# Patient Record
Sex: Male | Born: 1950
Health system: Southern US, Community
[De-identification: ages and names within clinical notes are randomized; demographics above are authoritative.]

## PROBLEM LIST (undated history)

## (undated) DIAGNOSIS — K219 Gastro-esophageal reflux disease without esophagitis: Secondary | ICD-10-CM

## (undated) DIAGNOSIS — E039 Hypothyroidism, unspecified: Secondary | ICD-10-CM

## (undated) DIAGNOSIS — M1990 Unspecified osteoarthritis, unspecified site: Secondary | ICD-10-CM

## (undated) DIAGNOSIS — D759 Disease of blood and blood-forming organs, unspecified: Secondary | ICD-10-CM

## (undated) DIAGNOSIS — E785 Hyperlipidemia, unspecified: Secondary | ICD-10-CM

## (undated) DIAGNOSIS — F32A Depression, unspecified: Secondary | ICD-10-CM

## (undated) DIAGNOSIS — C801 Malignant (primary) neoplasm, unspecified: Secondary | ICD-10-CM

## (undated) DIAGNOSIS — C4491 Basal cell carcinoma of skin, unspecified: Secondary | ICD-10-CM

## (undated) DIAGNOSIS — F329 Major depressive disorder, single episode, unspecified: Secondary | ICD-10-CM

## (undated) DIAGNOSIS — R200 Anesthesia of skin: Secondary | ICD-10-CM

## (undated) DIAGNOSIS — N400 Enlarged prostate without lower urinary tract symptoms: Secondary | ICD-10-CM

## (undated) DIAGNOSIS — J189 Pneumonia, unspecified organism: Secondary | ICD-10-CM

## (undated) DIAGNOSIS — J302 Other seasonal allergic rhinitis: Secondary | ICD-10-CM

## (undated) DIAGNOSIS — G4730 Sleep apnea, unspecified: Secondary | ICD-10-CM

## (undated) DIAGNOSIS — R7303 Prediabetes: Secondary | ICD-10-CM

## (undated) DIAGNOSIS — F419 Anxiety disorder, unspecified: Secondary | ICD-10-CM

## (undated) HISTORY — DX: Benign prostatic hyperplasia without lower urinary tract symptoms: N40.0

## (undated) HISTORY — DX: Gastro-esophageal reflux disease without esophagitis: K21.9

## (undated) HISTORY — DX: Malignant (primary) neoplasm, unspecified: C80.1

## (undated) HISTORY — PX: MOLE REMOVAL: SHX2046

## (undated) HISTORY — PX: TONSILLECTOMY: SUR1361

## (undated) HISTORY — DX: Anesthesia of skin: R20.0

## (undated) HISTORY — DX: Hyperlipidemia, unspecified: E78.5

## (undated) HISTORY — PX: CARDIAC CATHETERIZATION: SHX172

---
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
## 2002-11-14 ENCOUNTER — Ambulatory Visit (HOSPITAL_COMMUNITY): Admission: RE | Admit: 2002-11-14 | Discharge: 2002-11-14 | Payer: Self-pay | Admitting: Neurosurgery

## 2002-11-14 ENCOUNTER — Encounter: Payer: Self-pay | Admitting: Neurosurgery

## 2007-06-19 DIAGNOSIS — C4491 Basal cell carcinoma of skin, unspecified: Secondary | ICD-10-CM

## 2007-06-19 HISTORY — DX: Basal cell carcinoma of skin, unspecified: C44.91

## 2010-02-09 ENCOUNTER — Ambulatory Visit (HOSPITAL_COMMUNITY): Admission: RE | Admit: 2010-02-09 | Discharge: 2010-02-09 | Payer: Self-pay | Admitting: Cardiology

## 2010-02-09 ENCOUNTER — Encounter (INDEPENDENT_AMBULATORY_CARE_PROVIDER_SITE_OTHER): Payer: Self-pay | Admitting: Cardiology

## 2010-02-17 ENCOUNTER — Encounter (HOSPITAL_COMMUNITY): Admission: RE | Admit: 2010-02-17 | Discharge: 2010-03-07 | Payer: Self-pay | Admitting: Cardiology

## 2010-05-10 ENCOUNTER — Ambulatory Visit (HOSPITAL_COMMUNITY): Admission: RE | Admit: 2010-05-10 | Discharge: 2010-05-10 | Payer: Self-pay | Admitting: Internal Medicine

## 2010-08-31 ENCOUNTER — Ambulatory Visit: Payer: Self-pay | Admitting: Internal Medicine

## 2010-08-31 DIAGNOSIS — R1319 Other dysphagia: Secondary | ICD-10-CM

## 2010-08-31 DIAGNOSIS — R109 Unspecified abdominal pain: Secondary | ICD-10-CM

## 2010-08-31 DIAGNOSIS — K5900 Constipation, unspecified: Secondary | ICD-10-CM

## 2010-08-31 DIAGNOSIS — K219 Gastro-esophageal reflux disease without esophagitis: Secondary | ICD-10-CM

## 2010-09-18 ENCOUNTER — Ambulatory Visit (HOSPITAL_COMMUNITY): Admission: RE | Admit: 2010-09-18 | Discharge: 2010-09-18 | Payer: Self-pay | Admitting: Internal Medicine

## 2010-09-18 ENCOUNTER — Ambulatory Visit: Payer: Self-pay | Admitting: Internal Medicine
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
## 2010-11-21 NOTE — Letter (Signed)
Summary: TCS/EGD/ED ORDER  TCS/EGD/ED ORDER   Imported By: Ave Filter 08/31/2010 15:04:50  _____________________________________________________________________  External Attachment:    Type:   Image     Comment:   External Document

## 2010-12-08 ENCOUNTER — Encounter (INDEPENDENT_AMBULATORY_CARE_PROVIDER_SITE_OTHER): Payer: Self-pay

## 2010-12-13 NOTE — Letter (Signed)
Summary: Recall Colonoscopy/Endoscopy, Change to Office Visit  [facility]  [address]   [place], [place] [address]   Phone: [phone]  Fax: [phone]      December 08, 2010   Alfred Jones [address] [place], [place]  [address] 1951/06/05   Dear Mr. Gurski,   According to our records, it is time for you to schedule a Colonoscopy/Endoscopy. However, after reviewing your medical record, we recommend an office visit in order to determine your need for a repeat procedure.  Please call [phone] at your convenience to schedule an office visit. If you have any questions or concerns, please feel free to contact our office.   Sincerely,   Cloria Spring LPN  [facility] Ph: [phone]   Fax: [phone]

## 2011-01-09 LAB — BLOOD GAS, ARTERIAL
Acid-Base Excess: 1.6 mmol/L (ref 0.0–2.0)
pCO2 arterial: 39.9 mmHg (ref 35.0–45.0)
pH, Arterial: 7.423 (ref 7.350–7.450)
pO2, Arterial: 92.3 mmHg (ref 80.0–100.0)

## 2015-04-22 ENCOUNTER — Ambulatory Visit (INDEPENDENT_AMBULATORY_CARE_PROVIDER_SITE_OTHER): Payer: Self-pay | Admitting: Urology

## 2015-04-22 DIAGNOSIS — N401 Enlarged prostate with lower urinary tract symptoms: Secondary | ICD-10-CM

## 2015-04-22 DIAGNOSIS — R3912 Poor urinary stream: Secondary | ICD-10-CM

## 2015-06-24 ENCOUNTER — Ambulatory Visit (INDEPENDENT_AMBULATORY_CARE_PROVIDER_SITE_OTHER): Payer: Self-pay | Admitting: Urology

## 2015-06-24 DIAGNOSIS — N138 Other obstructive and reflux uropathy: Secondary | ICD-10-CM

## 2015-06-24 DIAGNOSIS — N401 Enlarged prostate with lower urinary tract symptoms: Secondary | ICD-10-CM

## 2015-06-24 DIAGNOSIS — R3912 Poor urinary stream: Secondary | ICD-10-CM

## 2015-09-12 ENCOUNTER — Other Ambulatory Visit: Payer: Self-pay | Admitting: Physician Assistant

## 2015-09-12 MED ORDER — OMEPRAZOLE 40 MG PO CPDR
40.0000 mg | DELAYED_RELEASE_CAPSULE | Freq: Every day | ORAL | Status: DC
Start: 2015-09-12 — End: 2018-04-15

## 2015-09-20 ENCOUNTER — Other Ambulatory Visit: Payer: Self-pay | Admitting: Physician Assistant

## 2015-09-20 MED ORDER — SIMVASTATIN 20 MG PO TABS: 20.0000 mg | ORAL_TABLET | Freq: Every day | ORAL | Status: DC

## 2015-10-18 ENCOUNTER — Ambulatory Visit: Payer: Self-pay | Admitting: Physician Assistant

## 2015-10-18 ENCOUNTER — Encounter: Payer: Self-pay | Admitting: Physician Assistant

## 2015-10-18 VITALS — BP 128/68 | HR 78 | Temp 98.6°F | Ht 71.0 in | Wt 228.2 lb

## 2015-10-18 DIAGNOSIS — B354 Tinea corporis: Secondary | ICD-10-CM

## 2015-10-18 MED ORDER — NYSTATIN 100000 UNIT/GM EX OINT: 1.0000 "application " | TOPICAL_OINTMENT | Freq: Three times a day (TID) | CUTANEOUS | Status: DC

## 2015-10-18 NOTE — Progress Notes (Signed)
BP 128/68 mmHg  Pulse 78  Temp(Src) 98.6 F (37 C)  Ht 5\' 11"  (1.803 m)  Wt 228 lb 3.2 oz (103.511 kg)  BMI 31.84 kg/m2  SpO2 96%   Subjective:    Patient ID: Alfred Jones, male    DOB: 1950-12-10, 64 y.o.   MRN: [id number]  HPI: Alfred Jones is a 64 y.o. male presenting on 10/18/2015 for Rash   HPI  Chief Complaint  Patient presents with  . Rash    itchy rash on both hands for about 2 weeks   States rash also on feet.  Thinks it is related to his hx athlete's foot.  He says he took 2 of his wife's diflucans (a week apart).  Relevant past medical, surgical, family and social history reviewed and updated as indicated. Interim medical history since our last visit reviewed. Allergies and medications reviewed and updated.   Current outpatient prescriptions:  .  citalopram (CELEXA) 20 MG tablet, Take 20 mg by mouth daily., Disp: , Rfl:  .  Coenzyme Q10 (CO Q 10 PO), Take by mouth daily., Disp: , Rfl:  .  Cyanocobalamin (VITAMIN B12 PO), Take 500 mcg by mouth daily., Disp: , Rfl:  .  Docusate Calcium (STOOL SOFTENER PO), Take by mouth as needed., Disp: , Rfl:  .  finasteride (PROSCAR) 5 MG tablet, Take 5 mg by mouth 2 (two) times daily., Disp: , Rfl:  .  Omega-3 Fatty Acids (FISH OIL PO), Take 2,000 mg by mouth 2 (two) times daily., Disp: , Rfl:  .  simvastatin (ZOCOR) 20 MG tablet, Take 1 tablet (20 mg total) by mouth at bedtime., Disp: 30 tablet, Rfl: 3 .  omeprazole (PRILOSEC) 40 MG capsule, Take 1 capsule (40 mg total) by mouth daily. (Patient not taking: Reported on 10/18/2015), Disp: 90 capsule, Rfl: 3   Review of Systems  Constitutional: Negative for fever, chills, diaphoresis, appetite change, fatigue and unexpected weight change.  HENT: Negative for congestion, dental problem, drooling, ear pain, facial swelling, hearing loss, mouth sores, sneezing, sore throat, trouble swallowing and voice change.   Eyes: Negative for pain, discharge, redness, itching and visual  disturbance.  Respiratory: Negative for cough, choking, shortness of breath and wheezing.   Cardiovascular: Negative for chest pain, palpitations and leg swelling.  Gastrointestinal: Positive for constipation. Negative for vomiting, abdominal pain, diarrhea and blood in stool.  Endocrine: Negative for cold intolerance, heat intolerance and polydipsia.  Genitourinary: Positive for decreased urine volume. Negative for dysuria and hematuria.  Musculoskeletal: Positive for back pain and arthralgias. Negative for gait problem.  Skin: Positive for rash.  Allergic/Immunologic: Negative for environmental allergies.  Neurological: Negative for seizures, syncope, light-headedness and headaches.  Hematological: Negative for adenopathy.  Psychiatric/Behavioral: Negative for suicidal ideas, dysphoric mood and agitation. The patient is not nervous/anxious.     Per HPI unless specifically indicated above     Objective:    BP 128/68 mmHg  Pulse 78  Temp(Src) 98.6 F (37 C)  Ht 5\' 11"  (1.803 m)  Wt 228 lb 3.2 oz (103.511 kg)  BMI 31.84 kg/m2  SpO2 96%  Wt Readings from Last 3 Encounters:  10/18/15 228 lb 3.2 oz (103.511 kg)  08/31/10 220 lb (99.791 kg)    Physical Exam  Constitutional: He is oriented to person, place, and time. He appears well-developed and well-nourished.  Pulmonary/Chest: Effort normal.  Neurological: He is alert and oriented to person, place, and time.  Skin: Skin is warm and dry.  Feet  without indications of tinea.  Hands with maceration in webspaces between 3rd and 4th fingers bilaterally.  Minimal redness without indication of secondary infection.  No swelling of the soft tissues of hand or fingers.   Psychiatric: He has a normal mood and affect. His behavior is normal.  Vitals reviewed.       Assessment & Plan:    Encounter Diagnosis  Name Primary?  . Tinea corporis Yes   -rx nystatin and counseled pt on care of his hands and feet -f/u as scheduled. RTO  sooner if rash fails to resolve

## 2015-10-23 DIAGNOSIS — G4730 Sleep apnea, unspecified: Secondary | ICD-10-CM

## 2015-10-23 HISTORY — DX: Sleep apnea, unspecified: G47.30

## 2015-11-09 ENCOUNTER — Other Ambulatory Visit: Payer: Self-pay | Admitting: Physician Assistant

## 2015-11-25 ENCOUNTER — Ambulatory Visit (INDEPENDENT_AMBULATORY_CARE_PROVIDER_SITE_OTHER): Payer: BLUE CROSS/BLUE SHIELD | Admitting: Urology

## 2015-11-25 DIAGNOSIS — N401 Enlarged prostate with lower urinary tract symptoms: Secondary | ICD-10-CM

## 2015-11-25 DIAGNOSIS — R3912 Poor urinary stream: Secondary | ICD-10-CM

## 2015-12-02 ENCOUNTER — Other Ambulatory Visit: Payer: Self-pay | Admitting: Physician Assistant

## 2015-12-02 LAB — COMPREHENSIVE METABOLIC PANEL
ALK PHOS: 51 U/L (ref 40–115)
ALT: 18 U/L (ref 9–46)
AST: 17 U/L (ref 10–35)
Albumin: 4 g/dL (ref 3.6–5.1)
BUN: 14 mg/dL (ref 7–25)
CALCIUM: 8.7 mg/dL (ref 8.6–10.3)
CO2: 26 mmol/L (ref 20–31)
Chloride: 105 mmol/L (ref 98–110)
Creat: 1.22 mg/dL (ref 0.70–1.25)
Glucose, Bld: 95 mg/dL (ref 65–99)
POTASSIUM: 4.1 mmol/L (ref 3.5–5.3)
Sodium: 138 mmol/L (ref 135–146)
TOTAL PROTEIN: 6.5 g/dL (ref 6.1–8.1)
Total Bilirubin: 0.7 mg/dL (ref 0.2–1.2)

## 2015-12-02 LAB — LIPID PANEL
CHOL/HDL RATIO: 4.8 ratio (ref <=5.0)
Cholesterol: 145 mg/dL (ref 125–200)
HDL: 30 mg/dL — AB (ref >=40)
LDL CALC: 86 mg/dL (ref <130)
TRIGLYCERIDES: 147 mg/dL (ref <150)
VLDL: 29 mg/dL (ref <30)

## 2015-12-03 LAB — PSA: PSA: 1.12 ng/mL (ref <=4.00)

## 2015-12-06 ENCOUNTER — Encounter: Payer: Self-pay | Admitting: Physician Assistant

## 2015-12-06 ENCOUNTER — Ambulatory Visit: Payer: Self-pay | Admitting: Physician Assistant

## 2015-12-06 VITALS — BP 118/68 | HR 60 | Temp 98.1°F | Ht 71.0 in | Wt 231.5 lb

## 2015-12-06 DIAGNOSIS — K219 Gastro-esophageal reflux disease without esophagitis: Secondary | ICD-10-CM

## 2015-12-06 DIAGNOSIS — E669 Obesity, unspecified: Secondary | ICD-10-CM

## 2015-12-06 DIAGNOSIS — F329 Major depressive disorder, single episode, unspecified: Secondary | ICD-10-CM

## 2015-12-06 DIAGNOSIS — E785 Hyperlipidemia, unspecified: Secondary | ICD-10-CM | POA: Insufficient documentation

## 2015-12-06 DIAGNOSIS — N400 Enlarged prostate without lower urinary tract symptoms: Secondary | ICD-10-CM

## 2015-12-06 DIAGNOSIS — F32A Depression, unspecified: Secondary | ICD-10-CM

## 2015-12-06 NOTE — Progress Notes (Signed)
BP 118/68 mmHg  Pulse 60  Temp(Src) 98.1 F (36.7 C)  Ht 5\' 11"  (1.803 m)  Wt 231 lb 8 oz (105.008 kg)  BMI 32.30 kg/m2  SpO2 97%   Subjective:    Patient ID: Alfred Jones, male    DOB: 04-22-1951, 65 y.o.   MRN: [id number]  HPI: Alfred Jones is a 65 y.o. male presenting on 12/06/2015 for Hyperlipidemia   HPI  Chief Complaint  Patient presents with  . Hyperlipidemia    pt went to the lab last week    Pt states he has insurance now.  Started within the last month  Relevant past medical, surgical, family and social history reviewed and updated as indicated. Interim medical history since our last visit reviewed. Allergies and medications reviewed and updated.  Current outpatient prescriptions:  .  citalopram (CELEXA) 20 MG tablet, TAKE ONE TABLET BY MOUTH ONCE DAILY, Disp: 30 tablet, Rfl: 1 .  Clobetasol Prop Emollient Base (CLOBETASOL PROPIONATE E) 0.05 % emollient cream, Apply topically 2 (two) times daily., Disp: , Rfl:  .  Coenzyme Q10 (CO Q 10 PO), Take by mouth daily., Disp: , Rfl:  .  Cyanocobalamin (VITAMIN B12 PO), Take 500 mcg by mouth daily., Disp: , Rfl:  .  Docusate Calcium (STOOL SOFTENER PO), Take by mouth as needed., Disp: , Rfl:  .  finasteride (PROSCAR) 5 MG tablet, Take 5 mg by mouth 2 (two) times daily., Disp: , Rfl:  .  ketoconazole (NIZORAL) 2 % cream, Apply 1 application topically daily., Disp: , Rfl:  .  nystatin ointment (MYCOSTATIN), Apply 1 application topically 3 (three) times daily., Disp: 30 g, Rfl: 1 .  Omega-3 Fatty Acids (FISH OIL PO), Take 2,000 mg by mouth 2 (two) times daily., Disp: , Rfl:  .  omeprazole (PRILOSEC) 40 MG capsule, Take 1 capsule (40 mg total) by mouth daily., Disp: 90 capsule, Rfl: 3 .  simvastatin (ZOCOR) 20 MG tablet, Take 1 tablet (20 mg total) by mouth at bedtime., Disp: 30 tablet, Rfl: 3  Review of Systems  Constitutional: Positive for fatigue. Negative for fever, chills, diaphoresis, appetite change and unexpected  weight change.  HENT: Negative for congestion, dental problem, drooling, ear pain, facial swelling, hearing loss, mouth sores, sneezing, sore throat, trouble swallowing and voice change.   Eyes: Negative for pain, discharge, redness, itching and visual disturbance.  Respiratory: Negative for cough, choking, shortness of breath and wheezing.   Cardiovascular: Negative for chest pain, palpitations and leg swelling.  Gastrointestinal: Positive for constipation. Negative for vomiting, abdominal pain, diarrhea and blood in stool.  Endocrine: Negative for cold intolerance, heat intolerance and polydipsia.  Genitourinary: Positive for decreased urine volume. Negative for dysuria and hematuria.  Musculoskeletal: Negative for back pain, arthralgias and gait problem.  Skin: Positive for rash.  Allergic/Immunologic: Negative for environmental allergies.  Neurological: Negative for seizures, syncope, light-headedness and headaches.  Hematological: Negative for adenopathy.  Psychiatric/Behavioral: Negative for suicidal ideas, dysphoric mood and agitation. The patient is not nervous/anxious.     Per HPI unless specifically indicated above     Objective:    BP 118/68 mmHg  Pulse 60  Temp(Src) 98.1 F (36.7 C)  Ht 5\' 11"  (1.803 m)  Wt 231 lb 8 oz (105.008 kg)  BMI 32.30 kg/m2  SpO2 97%  Wt Readings from Last 3 Encounters:  12/06/15 231 lb 8 oz (105.008 kg)  10/18/15 228 lb 3.2 oz (103.511 kg)  08/31/10 220 lb (99.791 kg)  Physical Exam  Constitutional: He is oriented to person, place, and time. He appears well-developed and well-nourished.  HENT:  Head: Normocephalic and atraumatic.  Neck: Neck supple.  Cardiovascular: Normal rate and regular rhythm.   Pulmonary/Chest: Effort normal and breath sounds normal. He has no wheezes.  Abdominal: Soft. Bowel sounds are normal. There is no hepatosplenomegaly. There is no tenderness.  Musculoskeletal: He exhibits no edema.  Lymphadenopathy:    He  has no cervical adenopathy.  Neurological: He is alert and oriented to person, place, and time.  Skin: Skin is warm and dry.  Psychiatric: He has a normal mood and affect. His behavior is normal.  Vitals reviewed.   Results for orders placed or performed in visit on 12/02/15  Comprehensive metabolic panel  Result Value Ref Range   Sodium 138 135 - 146 mmol/L   Potassium 4.1 3.5 - 5.3 mmol/L   Chloride 105 98 - 110 mmol/L   CO2 26 20 - 31 mmol/L   Glucose, Bld 95 65 - 99 mg/dL   BUN 14 7 - 25 mg/dL   Creat 1.22 0.70 - 1.25 mg/dL   Total Bilirubin 0.7 0.2 - 1.2 mg/dL   Alkaline Phosphatase 51 40 - 115 U/L   AST 17 10 - 35 U/L   ALT 18 9 - 46 U/L   Total Protein 6.5 6.1 - 8.1 g/dL   Albumin 4.0 3.6 - 5.1 g/dL   Calcium 8.7 8.6 - 10.3 mg/dL  Lipid panel  Result Value Ref Range   Cholesterol 145 125 - 200 mg/dL   Triglycerides 147 <150 mg/dL   HDL 30 (L) >=40 mg/dL   Total CHOL/HDL Ratio 4.8 <=5.0 Ratio   VLDL 29 <30 mg/dL   LDL Cholesterol 86 <130 mg/dL  PSA  Result Value Ref Range   PSA 1.12 <=4.00 ng/mL      Assessment & Plan:    -reviewed labs with pt -Pt has insurance wo we cannot see him any longer.  He states understanding. -Continue current meds -F/u with new pcp- recommend pt start looking now to get new patient appointment with new pcp

## 2016-01-12 ENCOUNTER — Other Ambulatory Visit: Payer: Self-pay | Admitting: Physician Assistant

## 2016-02-24 ENCOUNTER — Ambulatory Visit (INDEPENDENT_AMBULATORY_CARE_PROVIDER_SITE_OTHER): Payer: BLUE CROSS/BLUE SHIELD | Admitting: Urology

## 2016-02-24 DIAGNOSIS — N401 Enlarged prostate with lower urinary tract symptoms: Secondary | ICD-10-CM

## 2016-02-24 DIAGNOSIS — N138 Other obstructive and reflux uropathy: Secondary | ICD-10-CM

## 2016-02-24 DIAGNOSIS — R3912 Poor urinary stream: Secondary | ICD-10-CM

## 2016-04-08 DIAGNOSIS — R0609 Other forms of dyspnea: Secondary | ICD-10-CM | POA: Insufficient documentation

## 2016-07-17 DIAGNOSIS — G4730 Sleep apnea, unspecified: Secondary | ICD-10-CM | POA: Insufficient documentation

## 2016-07-22 DIAGNOSIS — L2089 Other atopic dermatitis: Secondary | ICD-10-CM | POA: Insufficient documentation

## 2016-09-03 DIAGNOSIS — M79609 Pain in unspecified limb: Secondary | ICD-10-CM | POA: Insufficient documentation

## 2016-09-03 DIAGNOSIS — R209 Unspecified disturbances of skin sensation: Secondary | ICD-10-CM | POA: Insufficient documentation

## 2016-09-04 DIAGNOSIS — G589 Mononeuropathy, unspecified: Secondary | ICD-10-CM | POA: Insufficient documentation

## 2016-09-18 ENCOUNTER — Ambulatory Visit (INDEPENDENT_AMBULATORY_CARE_PROVIDER_SITE_OTHER): Payer: BLUE CROSS/BLUE SHIELD | Admitting: Neurology

## 2016-09-18 ENCOUNTER — Encounter: Payer: Self-pay | Admitting: Neurology

## 2016-09-18 VITALS — BP 118/79 | HR 74 | Ht 71.0 in | Wt 218.0 lb

## 2016-09-18 DIAGNOSIS — R202 Paresthesia of skin: Secondary | ICD-10-CM | POA: Diagnosis not present

## 2016-09-18 MED ORDER — NORTRIPTYLINE HCL 25 MG PO CAPS: 50.0000 mg | ORAL_CAPSULE | Freq: Every day | ORAL | 11 refills | Status: DC

## 2016-09-18 NOTE — Patient Instructions (Signed)
[facility]     Address: [address] [place], [place], [place] [address] Phone: [phone]

## 2016-09-18 NOTE — Progress Notes (Signed)
PATIENT: Alfred Jones DOB: 25-Sep-1951  Chief Complaint  Patient presents with  . Numbness    Several months ago, he broke out in a red, blistered rash on his bilateral hands and feet.  The rash has resolved but he still has tingling, numbness and pain.  Marland Kitchen PCP    Rory Percy, MD     HISTORICAL  Alfred Jones is a 65 years old right-handed male, seen in refer by his primary care physician Dr. Rory Percy for evaluation of bilateral hand and feet paresthesia, initial evaluation was on November 28th 2017.  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  He has looked up Internet, came up with the possible diagnosis of dyshidrotic eczema  I reviewed laboratory evaluation in November 2017 mild elevated TSH 5.6, normal CMP, with glucose of 90, creatinine of 1.1, negative ANA, mild elevated uric acid 7.1, normal CBC with hemoglobin of 14 point 5, ESR of 2, vitamin Y30 160, folic acid 12 point 8  REVIEW OF SYSTEMS: Full 14 system review of systems performed and notable only for as above   ALLERGIES: No Known Allergies  HOME MEDICATIONS: Current Outpatient Prescriptions  Medication Sig Dispense Refill  . Clobetasol Prop Emollient Base (CLOBETASOL PROPIONATE E) 0.05 % emollient cream Apply topically 2 (two) times daily.    Mariane Baumgarten Calcium (STOOL  SOFTENER PO) Take by mouth as needed.    Marland Kitchen ketoconazole (NIZORAL) 2 % cream Apply 1 application topically daily.    Marland Kitchen nystatin ointment (MYCOSTATIN) Apply 1 application topically 3 (three) times daily. 30 g 1  . Omega-3 Fatty Acids (FISH OIL PO) Take 2,000 mg by mouth 2 (two) times daily.    Marland Kitchen omeprazole (PRILOSEC) 40 MG capsule Take 1 capsule (40 mg total) by mouth daily. 90 capsule 3  . simvastatin (ZOCOR) 20 MG tablet Take 1 tablet (20 mg total) by mouth at bedtime. 30 tablet 3   No current facility-administered medications for this visit.     PAST MEDICAL HISTORY: Past Medical History:  Diagnosis Date  . Acid reflux   . Enlarged prostate   . Hyperlipidemia   . Numbness     PAST SURGICAL HISTORY: Past Surgical History:  Procedure Laterality Date  . TONSILLECTOMY     as a child    FAMILY HISTORY: Family History  Problem Relation Age of Onset  . Heart disease Mother   . Diabetes Mother   . Heart attack Mother   . Heart disease Father   . Diabetes Father     SOCIAL HISTORY:  Social History   Social History  . Marital status: Married    Spouse name: N/A  . Number of children: 2  . Years of education: 12   Occupational History  . Mechanic    Social History Main Topics  . Smoking status: Former Smoker  Packs/day: 1.00    Years: 16.00    Types: Cigarettes    Quit date: 10/22/1986  . Smokeless tobacco: Never Used  . Alcohol use No  . Drug use: No  . Sexual activity: Not on file   Other Topics Concern  . Not on file   Social History Narrative   Lives at home with his wife and son.   Right-handed.   2-4 cups caffeine per day.     PHYSICAL EXAM   Vitals:   09/18/16 1312  BP: 118/79  Pulse: 74  Weight: 218 lb (98.9 kg)  Height: _0  (1.803 m)    Not recorded      Body mass index is 30.4 kg/m.  PHYSICAL EXAMNIATION:  Gen: NAD, conversant, well nourised, obese, well groomed                     Cardiovascular: Regular rate rhythm, no  peripheral edema, warm, nontender. Eyes: Conjunctivae clear without exudates or hemorrhage Neck: Supple, no carotid bruits. Pulmonary: Clear to auscultation bilaterally   NEUROLOGICAL EXAM:  MENTAL STATUS: Speech:    Speech is normal; fluent and spontaneous with normal comprehension.  Cognition:     Orientation to time, place and person     Normal recent and remote memory     Normal Attention span and concentration     Normal Language, naming, repeating,spontaneous speech     Fund of knowledge   CRANIAL NERVES: CN II: Visual fields are full to confrontation. Fundoscopic exam is normal with sharp discs and no vascular changes. Pupils are round equal and briskly reactive to light. CN III, IV, VI: extraocular movement are normal. No ptosis. CN V: Facial sensation is intact to pinprick in all 3 divisions bilaterally. Corneal responses are intact.  CN VII: Face is symmetric with normal eye closure and smile. CN VIII: Hearing is normal to rubbing fingers CN IX, X: Palate elevates symmetrically. Phonation is normal. CN XI: Head turning and shoulder shrug are intact CN XII: Tongue is midline with normal movements and no atrophy.  MOTOR: There is no pronator drift of out-stretched arms. Muscle bulk and tone are normal. Muscle strength is normal.  REFLEXES: Reflexes are 2+ and symmetric at the biceps, triceps, knees, and ankles. Plantar responses are flexor.  SENSORY: Intact to light touch, pinprick, positional sensation and vibratory sensation are intact in fingers and toes.  COORDINATION: Rapid alternating movements and fine finger movements are intact. There is no dysmetria on finger-to-nose and heel-knee-shin.    GAIT/STANCE: Posture is normal. Gait is steady with normal steps, base, arm swing, and turning. Heel and toe walking are normal. Tandem gait is normal.  Romberg is absent.   DIAGNOSTIC DATA (LABS, IMAGING, TESTING) - I reviewed patient records, labs, notes, testing and  imaging myself where available.   ASSESSMENT AND PLAN  Alfred Jones is a 65 y.o. male    Bilateral hands and feet paresthesia following blistering rash.  Consistent with a diagnosis of possible dyshidrotic eczema  Refer him to dermatologist  Nortriptyline 25 mg titrating to 50 mg every night for paresthesia and itching  Marcial Pacas, M.D. Ph.D.  [facility] [address] [place], [place] [address] Ph: [phone] Fax: [phone]  CC: Rory Percy, MD

## 2016-11-04 DIAGNOSIS — R5381 Other malaise: Secondary | ICD-10-CM | POA: Insufficient documentation

## 2016-12-12 DIAGNOSIS — J069 Acute upper respiratory infection, unspecified: Secondary | ICD-10-CM | POA: Insufficient documentation

## 2016-12-12 DIAGNOSIS — R062 Wheezing: Secondary | ICD-10-CM | POA: Insufficient documentation

## 2017-03-15 DIAGNOSIS — M129 Arthropathy, unspecified: Secondary | ICD-10-CM | POA: Insufficient documentation

## 2017-06-04 DIAGNOSIS — R609 Edema, unspecified: Secondary | ICD-10-CM | POA: Insufficient documentation

## 2017-06-21 ENCOUNTER — Ambulatory Visit (INDEPENDENT_AMBULATORY_CARE_PROVIDER_SITE_OTHER): Payer: BLUE CROSS/BLUE SHIELD | Admitting: Urology

## 2017-06-21 DIAGNOSIS — N401 Enlarged prostate with lower urinary tract symptoms: Secondary | ICD-10-CM

## 2017-06-21 DIAGNOSIS — R3912 Poor urinary stream: Secondary | ICD-10-CM | POA: Diagnosis not present

## 2017-06-21 DIAGNOSIS — N5201 Erectile dysfunction due to arterial insufficiency: Secondary | ICD-10-CM | POA: Diagnosis not present

## 2017-09-16 ENCOUNTER — Encounter: Payer: Self-pay | Admitting: Neurology

## 2017-09-16 ENCOUNTER — Other Ambulatory Visit: Payer: Self-pay | Admitting: *Deleted

## 2017-09-16 DIAGNOSIS — M79604 Pain in right leg: Secondary | ICD-10-CM

## 2017-10-01 ENCOUNTER — Encounter: Payer: BLUE CROSS/BLUE SHIELD | Admitting: Neurology

## 2017-10-10 ENCOUNTER — Ambulatory Visit (INDEPENDENT_AMBULATORY_CARE_PROVIDER_SITE_OTHER): Payer: BLUE CROSS/BLUE SHIELD | Admitting: Neurology

## 2017-10-10 DIAGNOSIS — M79604 Pain in right leg: Secondary | ICD-10-CM

## 2017-10-10 NOTE — Procedures (Signed)
[facility]  [place], [place]  [place], [place] [address] Tel: [phone] Fax:  [phone] Test Date:  10/10/2017  Patient: Alfred Jones DOB: 1951/05/17 Physician: Narda Amber, DO  Sex: Male Height: 5\' 11"  Ref Phys: Marella Chimes, PA  ID#: [id number] Temp: 35.3C Technician:    Patient Complaints: This is a 66 year old gentleman referred for evaluation of right leg burning pain.  NCV & EMG Findings: Extensive electrodiagnostic testing of the right lower extremity shows:  1. Right sural and superficial peroneal sensory responses are within normal limits. 2. Right peroneal motor response at the extensor digitorum brevis is absent, and normal at the tibialis anterior. These findings are nonspecific and most likely due to localized compression from shoe wearing. Right tibial motor responses within normal limits. 3. Right tibial H reflex study is within normal limits. 4. There is no evidence of active or chronic motor axonal loss changes affecting any of the tested muscles. Motor unit configuration and recruitment pattern is within normal limits.  Impression: This normal study of the right lower extremity. In particular, there is no evidence of a sensorimotor polyneuropathy or lumbosacral radiculopathy.   ___________________________ Narda Amber, DO    Nerve Conduction Studies Anti Sensory Summary Table   Site NR Peak (ms) Norm Peak (ms) P-T Amp (V) Norm P-T Amp  Right Sup Peroneal Anti Sensory (Ant Lat Mall)  12 cm    2.9 <4.6 7.8 >3  Right Sural Anti Sensory (Lat Mall)  Calf    2.7 <4.6 11.3 >3   Motor Summary Table   Site NR Onset (ms) Norm Onset (ms) O-P Amp (mV) Norm O-P Amp Site1 Site2 Delta-0 (ms) Dist (cm) Vel (m/s) Norm Vel (m/s)  Right Peroneal Motor (Ext Dig Brev)  Ankle NR  <6.0  >2.5 B Fib Ankle  0.0  >40  B Fib NR     Poplt B Fib  0.0  >40  Poplt NR            Right Peroneal TA Motor (Tib Ant)  Fib Head    4.3 <4.5 4.9 >3 Poplit Fib Head  1.1 8.0 73 >40  Poplit    5.4  4.9         Right Tibial Motor (Abd Hall Brev)  Ankle    3.5 <6.0 10.5 >4 Knee Ankle 9.0 40.0 44 >40  Knee    12.5  7.0          H Reflex Studies   NR H-Lat (ms) Lat Norm (ms) L-R H-Lat (ms)  Right Tibial (Gastroc)     34.42 <35    EMG   Side Muscle Ins Act Fibs Psw Fasc Number Recrt Dur Dur. Amp Amp. Poly Poly. Comment  Right AntTibialis Nml Nml Nml Nml Nml Nml Nml Nml Nml Nml Nml Nml N/A  Right Gastroc Nml Nml Nml Nml Nml Nml Nml Nml Nml Nml Nml Nml N/A  Right Flex Dig Long Nml Nml Nml Nml Nml Nml Nml Nml Nml Nml Nml Nml N/A  Right RectFemoris Nml Nml Nml Nml Nml Nml Nml Nml Nml Nml Nml Nml N/A  Right GluteusMed Nml Nml Nml Nml Nml Nml Nml Nml Nml Nml Nml Nml N/A  Right BicepsFemS Nml Nml Nml Nml Nml Nml Nml Nml Nml Nml Nml Nml N/A      Waveforms:

## 2018-02-11 DIAGNOSIS — E669 Obesity, unspecified: Secondary | ICD-10-CM | POA: Diagnosis not present

## 2018-02-11 DIAGNOSIS — M79604 Pain in right leg: Secondary | ICD-10-CM | POA: Diagnosis not present

## 2018-02-11 DIAGNOSIS — Z6831 Body mass index (BMI) 31.0-31.9, adult: Secondary | ICD-10-CM | POA: Diagnosis not present

## 2018-02-11 DIAGNOSIS — L4050 Arthropathic psoriasis, unspecified: Secondary | ICD-10-CM | POA: Diagnosis not present

## 2018-02-11 DIAGNOSIS — L603 Nail dystrophy: Secondary | ICD-10-CM | POA: Diagnosis not present

## 2018-02-11 DIAGNOSIS — M2550 Pain in unspecified joint: Secondary | ICD-10-CM | POA: Diagnosis not present

## 2018-02-11 DIAGNOSIS — M150 Primary generalized (osteo)arthritis: Secondary | ICD-10-CM | POA: Diagnosis not present

## 2018-02-11 DIAGNOSIS — M1990 Unspecified osteoarthritis, unspecified site: Secondary | ICD-10-CM | POA: Diagnosis not present

## 2018-03-11 DIAGNOSIS — C4491 Basal cell carcinoma of skin, unspecified: Secondary | ICD-10-CM

## 2018-03-11 DIAGNOSIS — D229 Melanocytic nevi, unspecified: Secondary | ICD-10-CM | POA: Diagnosis not present

## 2018-03-11 DIAGNOSIS — C44311 Basal cell carcinoma of skin of nose: Secondary | ICD-10-CM | POA: Diagnosis not present

## 2018-03-11 DIAGNOSIS — C44619 Basal cell carcinoma of skin of left upper limb, including shoulder: Secondary | ICD-10-CM | POA: Diagnosis not present

## 2018-03-11 DIAGNOSIS — L570 Actinic keratosis: Secondary | ICD-10-CM | POA: Diagnosis not present

## 2018-03-11 HISTORY — DX: Basal cell carcinoma of skin, unspecified: C44.91

## 2018-04-15 ENCOUNTER — Ambulatory Visit (INDEPENDENT_AMBULATORY_CARE_PROVIDER_SITE_OTHER): Payer: Medicare Other | Admitting: Neurology

## 2018-04-15 ENCOUNTER — Encounter: Payer: Self-pay | Admitting: Neurology

## 2018-04-15 ENCOUNTER — Telehealth: Payer: Self-pay | Admitting: Neurology

## 2018-04-15 ENCOUNTER — Other Ambulatory Visit: Payer: Self-pay | Admitting: *Deleted

## 2018-04-15 VITALS — BP 118/73 | HR 56 | Ht 71.0 in | Wt 223.5 lb

## 2018-04-15 DIAGNOSIS — R1031 Right lower quadrant pain: Secondary | ICD-10-CM

## 2018-04-15 DIAGNOSIS — R202 Paresthesia of skin: Secondary | ICD-10-CM

## 2018-04-15 DIAGNOSIS — M5441 Lumbago with sciatica, right side: Secondary | ICD-10-CM

## 2018-04-15 DIAGNOSIS — G8929 Other chronic pain: Secondary | ICD-10-CM | POA: Diagnosis not present

## 2018-04-15 MED ORDER — DULOXETINE HCL 60 MG PO CPEP: 60.0000 mg | ORAL_CAPSULE | Freq: Every day | ORAL | 11 refills | Status: DC

## 2018-04-15 NOTE — Telephone Encounter (Signed)
Ok, per vo by Dr. Krista Blue, to change order.  Updated in [place].

## 2018-04-15 NOTE — Telephone Encounter (Signed)
Medicare/BCBS supp order sent to [facility]. They will reach out to the pt to schedule.  °

## 2018-04-15 NOTE — Telephone Encounter (Signed)
Sharyn Lull will you re- order for Dr. Krista Blue please needs to be put in per [facility]  As a order . Order number for Pelvis limited .  IMG  -550 and put in comment's right groin area pain fullness sensation . Thanks [place] .

## 2018-04-15 NOTE — Progress Notes (Signed)
PATIENT: Alfred Jones DOB: [birth year]  Chief Complaint  Patient presents with  . New Patient (Initial Visit)    Pt here alone. Referred by Marella Chimes, PA for right leg burning     HISTORICAL  Alfred Jones is a 67 years old male, seen in refer by his rheumatologist PA Marella Chimes for evaluation of right leg and the foot pain, initial evaluation was on April 16, 2018.  He had history of rheumatoid arthritis, now taking methotrexate injection 250 mg every week, but still has multiple joints pain,  I saw him initially in November 2017, at that time, he presented with rash between his toes, intense itching, have raised erythematous rash, later the rash will blister, can be the size of nickel, spreading from toes to the top of his foot, similar rash involving his fingers, he was seen by local dermatologist,  Extensive laboratory evaluation in November 2017 mild elevated TSH 5.6, normal CMP, with glucose of 90, creatinine of 1.1, negative ANA, mild elevated uric acid 7.1, normal CBC with hemoglobin of 14 point 5, ESR of 2, vitamin V77 939, folic acid 12 point   His symptoms gradually improved, I am not sure the exact diagnosis he had,  His and not sure exact diagnosis he had, but since then, he began to notice right leg discomfort, right groin area pressure sensation, radiating pain to his right anterior thigh, right leg deep achy pain, also complains of right-sided low back pain, his right leg pain can be so severe, he actually presented to the emergency room once, ultrasound of right lower extremity showed no evidence of DVT.  He tried to take off his statin without helping, he described fullness of his right groin area,  REVIEW OF SYSTEMS: Full 14 system review of systems performed and notable only for fatigue, swelling legs, joint pain, joint swelling, urination problems, numbness, insomnia, restless leg  ALLERGIES: No Known Allergies  HOME MEDICATIONS: Current Outpatient Medications    Medication Sig Dispense Refill  . alfuzosin (UROXATRAL) 10 MG 24 hr tablet Take 10 mg by mouth daily with breakfast.    . Apremilast (OTEZLA) 30 MG TABS Take 1 tablet by mouth 2 (two) times daily.    . citalopram (CELEXA) 20 MG tablet   3  . Docusate Calcium (STOOL SOFTENER PO) Take by mouth as needed.    . Flaxseed, Linseed, (FLAX SEED OIL PO) Take by mouth.    . folic acid (FOLVITE) 1 MG tablet Take 1 mg by mouth 2 (two) times daily.    Marland Kitchen gabapentin (NEURONTIN) 400 MG capsule Take 400 mg by mouth at bedtime.  3  . Melatonin 10 MG TABS Take by mouth at bedtime as needed.    . methotrexate 250 MG/10ML injection Inject into the vein once a week.    . naproxen sodium (ALEVE) 220 MG tablet Take 220 mg by mouth daily as needed.    . Omega-3 Fatty Acids (FISH OIL PO) Take 2,000 mg by mouth 2 (two) times daily.    . ranitidine (ZANTAC) 150 MG tablet Take 150 mg by mouth 2 (two) times daily.    . simvastatin (ZOCOR) 20 MG tablet Take 1 tablet (20 mg total) by mouth at bedtime. (Patient not taking: Reported on 04/15/2018) 30 tablet 3   No current facility-administered medications for this visit.     PAST MEDICAL HISTORY: Past Medical History:  Diagnosis Date  . Acid reflux   . Enlarged prostate   . Hyperlipidemia   .  Numbness     PAST SURGICAL HISTORY: Past Surgical History:  Procedure Laterality Date  . TONSILLECTOMY     as a child    FAMILY HISTORY: Family History  Problem Relation Age of Onset  . Heart disease Mother   . Diabetes Mother   . Heart attack Mother   . Heart disease Father   . Diabetes Father     SOCIAL HISTORY:  Social History   Socioeconomic History  . Marital status: Married    Spouse name: Not on file  . Number of children: 2  . Years of education: 70  . Highest education level: Not on file  Occupational History  . Occupation: Manufacturing systems engineer  . Financial resource strain: Not on file  . Food insecurity:    Worry: Not on file    Inability:  Not on file  . Transportation needs:    Medical: Not on file    Non-medical: Not on file  Tobacco Use  . Smoking status: Former Smoker    Packs/day: 1.00    Years: 16.00    Pack years: 16.00    Types: Cigarettes    Last attempt to quit: 10/22/1986    Years since quitting: 31.5  . Smokeless tobacco: Never Used  Substance and Sexual Activity  . Alcohol use: No  . Drug use: No  . Sexual activity: Not on file  Lifestyle  . Physical activity:    Days per week: Not on file    Minutes per session: Not on file  . Stress: Not on file  Relationships  . Social connections:    Talks on phone: Not on file    Gets together: Not on file    Attends religious service: Not on file    Active member of club or organization: Not on file    Attends meetings of clubs or organizations: Not on file    Relationship status: Not on file  . Intimate partner violence:    Fear of current or ex partner: Not on file    Emotionally abused: Not on file    Physically abused: Not on file    Forced sexual activity: Not on file  Other Topics Concern  . Not on file  Social History Narrative   Lives at home with his wife and son.   Right-handed.   2-4 cups caffeine per day.     PHYSICAL EXAM   Vitals:   04/15/18 0948  BP: 118/73  Pulse: (!) 56  Weight: 223 lb 8 oz (101.4 kg)  Height: _0  (1.803 m)    Not recorded      Body mass index is 31.17 kg/m.  PHYSICAL EXAMNIATION:  Gen: NAD, conversant, well nourised, obese, well groomed                     Cardiovascular: Regular rate rhythm, no peripheral edema, warm, nontender. Eyes: Conjunctivae clear without exudates or hemorrhage Neck: Supple, no carotid bruits. Pulmonary: Clear to auscultation bilaterally   NEUROLOGICAL EXAM:  MENTAL STATUS: Speech:    Speech is normal; fluent and spontaneous with normal comprehension.  Cognition:     Orientation to time, place and person     Normal recent and remote memory     Normal Attention span  and concentration     Normal Language, naming, repeating,spontaneous speech     Fund of knowledge   CRANIAL NERVES: CN II: Visual fields are full to confrontation. Fundoscopic exam is normal with  sharp discs and no vascular changes. Pupils are round equal and briskly reactive to light. CN III, IV, VI: extraocular movement are normal. No ptosis. CN V: Facial sensation is intact to pinprick in all 3 divisions bilaterally. Corneal responses are intact.  CN VII: Face is symmetric with normal eye closure and smile. CN VIII: Hearing is normal to rubbing fingers CN IX, X: Palate elevates symmetrically. Phonation is normal. CN XI: Head turning and shoulder shrug are intact CN XII: Tongue is midline with normal movements and no atrophy.  MOTOR: There is no pronator drift of out-stretched arms. Muscle bulk and tone are normal. Muscle strength is normal.  REFLEXES: Reflexes are 2+ and symmetric at the biceps, triceps, knees, and ankles. Plantar responses are flexor.  SENSORY: Intact to light touch, pinprick, positional sensation and vibratory sensation are intact in fingers and toes.  COORDINATION: Rapid alternating movements and fine finger movements are intact. There is no dysmetria on finger-to-nose and heel-knee-shin.    GAIT/STANCE: Posture is normal. Gait is steady with normal steps, base, arm swing, and turning. Heel and toe walking are normal. Tandem gait is normal.  Romberg is absent.   DIAGNOSTIC DATA (LABS, IMAGING, TESTING) - I reviewed patient records, labs, notes, testing and imaging myself where available.   ASSESSMENT AND PLAN  Alfred Jones is a 67 y.o. male   Right-sided low back pain, radiating pain to right lower extremity   Need to rule out right lumbar radiculopathy  Proceed with MRI of lumbar  Laboratory evaluations  Right groin area fullness sensation  Ultrasound of pelvic  Marcial Pacas, M.D. Ph.D.  [facility] [address] [place], [place] [address] Ph: [phone] Fax: [phone]  CC: Referring Provider

## 2018-04-16 ENCOUNTER — Telehealth: Payer: Self-pay | Admitting: Neurology

## 2018-04-16 LAB — COMPREHENSIVE METABOLIC PANEL
ALBUMIN: 4.5 g/dL (ref 3.6–4.8)
ALK PHOS: 56 IU/L (ref 39–117)
ALT: 22 IU/L (ref 0–44)
AST: 23 IU/L (ref 0–40)
Albumin/Globulin Ratio: 2 (ref 1.2–2.2)
BILIRUBIN TOTAL: 0.4 mg/dL (ref 0.0–1.2)
BUN / CREAT RATIO: 14 (ref 10–24)
BUN: 14 mg/dL (ref 8–27)
CHLORIDE: 105 mmol/L (ref 96–106)
CO2: 22 mmol/L (ref 20–29)
CREATININE: 1.03 mg/dL (ref 0.76–1.27)
Calcium: 9 mg/dL (ref 8.6–10.2)
GFR calc Af Amer: 88 mL/min/{1.73_m2} (ref >59)
GFR calc non Af Amer: 76 mL/min/{1.73_m2} (ref >59)
GLOBULIN, TOTAL: 2.3 g/dL (ref 1.5–4.5)
Glucose: 82 mg/dL (ref 65–99)
Potassium: 4.9 mmol/L (ref 3.5–5.2)
SODIUM: 139 mmol/L (ref 134–144)
Total Protein: 6.8 g/dL (ref 6.0–8.5)

## 2018-04-16 LAB — LIPID PANEL
CHOLESTEROL TOTAL: 196 mg/dL (ref 100–199)
Chol/HDL Ratio: 5.9 ratio — ABNORMAL HIGH (ref 0.0–5.0)
HDL: 33 mg/dL — AB (ref >39)
LDL Calculated: 117 mg/dL — ABNORMAL HIGH (ref 0–99)
TRIGLYCERIDES: 230 mg/dL — AB (ref 0–149)
VLDL Cholesterol Cal: 46 mg/dL — ABNORMAL HIGH (ref 5–40)

## 2018-04-16 LAB — CBC WITH DIFFERENTIAL/PLATELET
BASOS ABS: 0 10*3/uL (ref 0.0–0.2)
BASOS: 1 %
EOS (ABSOLUTE): 0.3 10*3/uL (ref 0.0–0.4)
EOS: 5 %
Hematocrit: 40.9 % (ref 37.5–51.0)
Hemoglobin: 13.8 g/dL (ref 13.0–17.7)
IMMATURE GRANS (ABS): 0 10*3/uL (ref 0.0–0.1)
IMMATURE GRANULOCYTES: 0 %
Lymphocytes Absolute: 1.9 10*3/uL (ref 0.7–3.1)
Lymphs: 29 %
MCH: 33.1 pg — ABNORMAL HIGH (ref 26.6–33.0)
MCHC: 33.7 g/dL (ref 31.5–35.7)
MCV: 98 fL — ABNORMAL HIGH (ref 79–97)
Monocytes Absolute: 0.7 10*3/uL (ref 0.1–0.9)
Monocytes: 11 %
Neutrophils Absolute: 3.6 10*3/uL (ref 1.4–7.0)
Neutrophils: 54 %
Platelets: 394 10*3/uL (ref 150–450)
RBC: 4.17 x10E6/uL (ref 4.14–5.80)
RDW: 13.8 % (ref 12.3–15.4)
WBC: 6.6 10*3/uL (ref 3.4–10.8)

## 2018-04-16 LAB — TSH: TSH: 3.41 u[IU]/mL (ref 0.450–4.500)

## 2018-04-16 LAB — VITAMIN B12: VITAMIN B 12: 438 pg/mL (ref 232–1245)

## 2018-04-16 LAB — HGB A1C W/O EAG: Hgb A1c MFr Bld: 5.2 % (ref 4.8–5.6)

## 2018-04-16 NOTE — Telephone Encounter (Signed)
Left patient a detailed message, with results, on voicemail (ok per DPR).  Provided our number to call back with any questions.  

## 2018-04-16 NOTE — Telephone Encounter (Signed)
Please call patient, laboratory evaluation showed mild abnormal lipid profile, elevated LDL 117, triglyceride 230,  Rest of laboratory evaluation showed no significant abnormality.  In specific A1c is 5.2, no evidence of diabetes,  He should start control his hyperlipidemia by exercise, healthy diet

## 2018-04-18 DIAGNOSIS — R1084 Generalized abdominal pain: Secondary | ICD-10-CM | POA: Diagnosis not present

## 2018-04-18 DIAGNOSIS — R300 Dysuria: Secondary | ICD-10-CM | POA: Diagnosis not present

## 2018-04-22 ENCOUNTER — Other Ambulatory Visit: Payer: Self-pay | Admitting: Neurology

## 2018-04-22 DIAGNOSIS — T1590XA Foreign body on external eye, part unspecified, unspecified eye, initial encounter: Secondary | ICD-10-CM

## 2018-04-23 ENCOUNTER — Telehealth: Payer: Self-pay | Admitting: Neurology

## 2018-04-23 NOTE — Telephone Encounter (Signed)
Alfred Jones from [place] [phone] . [phone]  She needs to know why Dr. Krista Blue is wanting test .  US PELVIS LIMITED

## 2018-04-23 NOTE — Telephone Encounter (Signed)
Right groin area fullness sensation

## 2018-04-23 NOTE — Telephone Encounter (Signed)
Called Faby back and relayed  Right groin area fullness sensation ---  Faby said she would relay to [facility] and call me back.

## 2018-04-28 DIAGNOSIS — Z6831 Body mass index (BMI) 31.0-31.9, adult: Secondary | ICD-10-CM | POA: Diagnosis not present

## 2018-04-28 DIAGNOSIS — M79604 Pain in right leg: Secondary | ICD-10-CM | POA: Diagnosis not present

## 2018-04-28 DIAGNOSIS — L603 Nail dystrophy: Secondary | ICD-10-CM | POA: Diagnosis not present

## 2018-04-28 DIAGNOSIS — M150 Primary generalized (osteo)arthritis: Secondary | ICD-10-CM | POA: Diagnosis not present

## 2018-04-28 DIAGNOSIS — D0439 Carcinoma in situ of skin of other parts of face: Secondary | ICD-10-CM | POA: Diagnosis not present

## 2018-04-28 DIAGNOSIS — L4050 Arthropathic psoriasis, unspecified: Secondary | ICD-10-CM | POA: Diagnosis not present

## 2018-04-28 DIAGNOSIS — M1990 Unspecified osteoarthritis, unspecified site: Secondary | ICD-10-CM | POA: Diagnosis not present

## 2018-04-28 DIAGNOSIS — E669 Obesity, unspecified: Secondary | ICD-10-CM | POA: Diagnosis not present

## 2018-04-28 DIAGNOSIS — M2550 Pain in unspecified joint: Secondary | ICD-10-CM | POA: Diagnosis not present

## 2018-04-29 ENCOUNTER — Ambulatory Visit
Admission: RE | Admit: 2018-04-29 | Discharge: 2018-04-29 | Disposition: A | Discharge status: Home / Self Care | Payer: Medicare Other | Source: Ambulatory Visit | Attending: Neurology | Admitting: Neurology

## 2018-04-29 DIAGNOSIS — G8929 Other chronic pain: Secondary | ICD-10-CM

## 2018-04-29 DIAGNOSIS — M48061 Spinal stenosis, lumbar region without neurogenic claudication: Secondary | ICD-10-CM | POA: Diagnosis not present

## 2018-04-29 DIAGNOSIS — M5441 Lumbago with sciatica, right side: Principal | ICD-10-CM

## 2018-04-29 DIAGNOSIS — T1590XA Foreign body on external eye, part unspecified, unspecified eye, initial encounter: Secondary | ICD-10-CM

## 2018-04-29 DIAGNOSIS — Z135 Encounter for screening for eye and ear disorders: Secondary | ICD-10-CM | POA: Diagnosis not present

## 2018-04-30 ENCOUNTER — Telehealth: Payer: Self-pay | Admitting: Neurology

## 2018-04-30 ENCOUNTER — Ambulatory Visit
Admission: RE | Admit: 2018-04-30 | Discharge: 2018-04-30 | Disposition: A | Discharge status: Home / Self Care | Payer: Medicare Other | Source: Ambulatory Visit | Attending: Neurology | Admitting: Neurology

## 2018-04-30 DIAGNOSIS — R1030 Lower abdominal pain, unspecified: Secondary | ICD-10-CM | POA: Diagnosis not present

## 2018-04-30 DIAGNOSIS — R1031 Right lower quadrant pain: Secondary | ICD-10-CM

## 2018-04-30 NOTE — Telephone Encounter (Signed)
Spoke to patient - he is aware of the results.  His appt has been moved to 05/01/18 to further discuss with Dr. Krista Blue.

## 2018-04-30 NOTE — Telephone Encounter (Signed)
Pt returning RN's call.

## 2018-04-30 NOTE — Telephone Encounter (Signed)
Please call patient, MRI of the lumbar showed multilevel degenerative changes, most severe at L4-5, there is evidence of severe central canal stenosis, with impingement of nerve roots,  Please move up his appointment to my next available, so we can go over the films and potential long-term treatment plan.  Spondylosis appearing worst at L4-5 where there is moderately severe to severe central canal stenosis and bilateral subarticular recess narrowing with impingement on the L5 roots. Moderate right foraminal narrowing is seen at this level. The right facet joint is hypoplastic and there is advanced facet arthropathy on the left.  Hypoplastic right facet at L5-S1 with facet arthropathy on the left. Disc and facet degenerative change cause moderate to moderately severe left foraminal narrowing at this level.  Mild central canal and right subarticular recess narrowing L3-4.  Mild to moderate central canal and bilateral subarticular recess narrowing at L2-3 where there is a shallow down turning central protrusion

## 2018-05-01 ENCOUNTER — Telehealth: Payer: Self-pay | Admitting: Neurology

## 2018-05-01 ENCOUNTER — Ambulatory Visit (INDEPENDENT_AMBULATORY_CARE_PROVIDER_SITE_OTHER): Payer: Medicare Other | Admitting: Neurology

## 2018-05-01 ENCOUNTER — Encounter: Payer: Self-pay | Admitting: Neurology

## 2018-05-01 VITALS — BP 122/60 | HR 68 | Ht 71.0 in | Wt 222.0 lb

## 2018-05-01 DIAGNOSIS — M48061 Spinal stenosis, lumbar region without neurogenic claudication: Secondary | ICD-10-CM

## 2018-05-01 DIAGNOSIS — R202 Paresthesia of skin: Secondary | ICD-10-CM

## 2018-05-01 DIAGNOSIS — R269 Unspecified abnormalities of gait and mobility: Secondary | ICD-10-CM | POA: Insufficient documentation

## 2018-05-01 NOTE — Telephone Encounter (Signed)
IMPRESSION: Negative ultrasound of the right inguinal region. No hernia or other abnormality identified.

## 2018-05-01 NOTE — Telephone Encounter (Signed)
Patient arrived to his pending follow up scheduled today.  The results will be discussed during his appointment.

## 2018-05-01 NOTE — Progress Notes (Signed)
PATIENT: Alfred Jones DOB: [birth year]  Chief Complaint  Patient presents with  . Low back Pain/Fullness in Groin    He would like to review his MRI and ultrasound results.     HISTORICAL  Alfred Jones is a 67 years old male, seen in refer by his rheumatologist PA Marella Chimes for evaluation of right leg and the foot pain, initial evaluation was on April 16, 2018.  He had history of rheumatoid arthritis, now taking methotrexate injection 250 mg every week, but still has multiple joints pain,  I saw him initially in November 2017, at that time, he presented with rash between his toes, intense itching, have raised erythematous rash, later the rash will blister, can be the size of nickel, spreading from toes to the top of his foot, similar rash involving his fingers, he was seen by local dermatologist,  Extensive laboratory evaluation in November 2017 mild elevated TSH 5.6, normal CMP, with glucose of 90, creatinine of 1.1, negative ANA, mild elevated uric acid 7.1, normal CBC with hemoglobin of 14 point 5, ESR of 2, vitamin W88 891, folic acid 12 point   His symptoms gradually improved, he is not sure exact diagnosis he had, but since then, he began to notice right leg discomfort, right groin area pressure sensation, radiating pain to his right anterior thigh, right leg deep achy pain, also complains of right-sided low back pain, his right leg pain can be so severe, he actually presented to the emergency room once, ultrasound of right lower extremity showed no evidence of DVT.  He tried to take off his statin without helping, he described fullness of his right groin area,  UPDATE May 01 2018: He still has mild unsteady gait, low back pain, radiating pain to right groin thigh area, in addition, he complains 3 to 4 years history of waking up feel shaky, numbness tingling involving his whole body, bilateral hands tremor, achy pain,  We have personally reviewed MRI of lumbar in July 2019,  multilevel degenerative changes, worst at L4-5 moderate severe central canal stenosis, with potential impingement upon bilateral L5 nerve roots, hypoplastic right facet at L5-S1 with facet arthropathy on the left side,  Ultrasound of abdomen and pelvic was normal  REVIEW OF SYSTEMS: Full 14 system review of systems performed and notable only for as above.  ALLERGIES: No Known Allergies  HOME MEDICATIONS: Current Outpatient Medications  Medication Sig Dispense Refill  . alfuzosin (UROXATRAL) 10 MG 24 hr tablet Take 10 mg by mouth daily with breakfast.    . Apremilast (OTEZLA) 30 MG TABS Take 1 tablet by mouth 2 (two) times daily.    . citalopram (CELEXA) 20 MG tablet   3  . Docusate Calcium (STOOL SOFTENER PO) Take by mouth as needed.    . DULoxetine (CYMBALTA) 60 MG capsule Take 1 capsule (60 mg total) by mouth daily. 30 capsule 11  . Flaxseed, Linseed, (FLAX SEED OIL PO) Take by mouth.    . folic acid (FOLVITE) 1 MG tablet Take 1 mg by mouth 2 (two) times daily.    Marland Kitchen gabapentin (NEURONTIN) 400 MG capsule Take 400 mg by mouth at bedtime.  3  . Melatonin 10 MG TABS Take by mouth at bedtime as needed.    . methotrexate 250 MG/10ML injection Inject into the vein once a week.    . naproxen sodium (ALEVE) 220 MG tablet Take 220 mg by mouth daily as needed.    . Omega-3 Fatty Acids (FISH  OIL PO) Take 2,000 mg by mouth 2 (two) times daily.    . ranitidine (ZANTAC) 150 MG tablet Take 150 mg by mouth 2 (two) times daily.    . simvastatin (ZOCOR) 20 MG tablet Take 1 tablet (20 mg total) by mouth at bedtime. 30 tablet 3   No current facility-administered medications for this visit.     PAST MEDICAL HISTORY: Past Medical History:  Diagnosis Date  . Acid reflux   . Enlarged prostate   . Hyperlipidemia   . Numbness     PAST SURGICAL HISTORY: Past Surgical History:  Procedure Laterality Date  . TONSILLECTOMY     as a child    FAMILY HISTORY: Family History  Problem Relation Age of  Onset  . Heart disease Mother   . Diabetes Mother   . Heart attack Mother   . Heart disease Father   . Diabetes Father     SOCIAL HISTORY:  Social History   Socioeconomic History  . Marital status: Married    Spouse name: Not on file  . Number of children: 2  . Years of education: 37  . Highest education level: Not on file  Occupational History  . Occupation: Manufacturing systems engineer  . Financial resource strain: Not on file  . Food insecurity:    Worry: Not on file    Inability: Not on file  . Transportation needs:    Medical: Not on file    Non-medical: Not on file  Tobacco Use  . Smoking status: Former Smoker    Packs/day: 1.00    Years: 16.00    Pack years: 16.00    Types: Cigarettes    Last attempt to quit: 10/22/1986    Years since quitting: 31.5  . Smokeless tobacco: Never Used  Substance and Sexual Activity  . Alcohol use: No  . Drug use: No  . Sexual activity: Not on file  Lifestyle  . Physical activity:    Days per week: Not on file    Minutes per session: Not on file  . Stress: Not on file  Relationships  . Social connections:    Talks on phone: Not on file    Gets together: Not on file    Attends religious service: Not on file    Active member of club or organization: Not on file    Attends meetings of clubs or organizations: Not on file    Relationship status: Not on file  . Intimate partner violence:    Fear of current or ex partner: Not on file    Emotionally abused: Not on file    Physically abused: Not on file    Forced sexual activity: Not on file  Other Topics Concern  . Not on file  Social History Narrative   Lives at home with his wife and son.   Right-handed.   2-4 cups caffeine per day.     PHYSICAL EXAM   Vitals:   05/01/18 1530  BP: 122/60  Pulse: 68  Weight: 222 lb (100.7 kg)  Height: _0  (1.803 m)    Not recorded      Body mass index is 30.96 kg/m.  PHYSICAL EXAMNIATION:  Gen: NAD, conversant, well  nourised, obese, well groomed                     Cardiovascular: Regular rate rhythm, no peripheral edema, warm, nontender. Eyes: Conjunctivae clear without exudates or hemorrhage Neck: Supple, no carotid bruits. Pulmonary: Clear  to auscultation bilaterally   NEUROLOGICAL EXAM:  MENTAL STATUS: Speech:    Speech is normal; fluent and spontaneous with normal comprehension.  Cognition:     Orientation to time, place and person     Normal recent and remote memory     Normal Attention span and concentration     Normal Language, naming, repeating,spontaneous speech     Fund of knowledge   CRANIAL NERVES: CN II: Visual fields are full to confrontation. Fundoscopic exam is normal with sharp discs and no vascular changes. Pupils are round equal and briskly reactive to light. CN III, IV, VI: extraocular movement are normal. No ptosis. CN V: Facial sensation is intact to pinprick in all 3 divisions bilaterally. Corneal responses are intact.  CN VII: Face is symmetric with normal eye closure and smile. CN VIII: Hearing is normal to rubbing fingers CN IX, X: Palate elevates symmetrically. Phonation is normal. CN XI: Head turning and shoulder shrug are intact CN XII: Tongue is midline with normal movements and no atrophy.  MOTOR: He has mild bilateral toe extension/flexion weakness.  He has no significant proximal hip flexion, knee flexion, extension weakness.    REFLEXES: Reflexes are 2+ and symmetric at the biceps, 3/3 trace at triceps, knees, trace at ankles. Plantar responses are flexor.  SENSORY: Intact to light touch, pinprick, positional sensation and vibratory sensation are intact in fingers and toes.  COORDINATION: Rapid alternating movements and fine finger movements are intact. There is no dysmetria on finger-to-nose and heel-knee-shin.    GAIT/STANCE: He has mild unsteady gait, mild difficulty perform tiptoe walking DIAGNOSTIC DATA (LABS, IMAGING, TESTING) - I reviewed  patient records, labs, notes, testing and imaging myself where available.  ASSESSMENT AND PLAN  ADYEN BIFULCO is a 67 y.o. male   Lumbar stenosis  Mostly severe at L4-5 level  He denies intractable pain, no bowel bladder incontinence, does have mild distal weakness,  Not a surgery candidate at this point  Intermittent bilateral hands paresthesia,  Hyperreflexia of bilateral lower extremity, need to rule out superimposed cervical spondylitic myelopathy  Proceed with MRI of cervical spine  Marcial Pacas, M.D. Ph.D.  [facility] [address], [place], [place] [address] Ph: [phone] Fax: [phone]  CC: Referring Provider

## 2018-05-01 NOTE — Telephone Encounter (Signed)
Medicare/BCBS supp order sent to [facility]. No auth they will reach out to the pt to schedule.  °

## 2018-05-22 ENCOUNTER — Ambulatory Visit
Admission: RE | Admit: 2018-05-22 | Discharge: 2018-05-22 | Disposition: A | Discharge status: Home / Self Care | Payer: Medicare Other | Source: Ambulatory Visit | Attending: Neurology | Admitting: Neurology

## 2018-05-22 DIAGNOSIS — R269 Unspecified abnormalities of gait and mobility: Secondary | ICD-10-CM

## 2018-05-22 DIAGNOSIS — M48061 Spinal stenosis, lumbar region without neurogenic claudication: Secondary | ICD-10-CM

## 2018-05-22 DIAGNOSIS — M4802 Spinal stenosis, cervical region: Secondary | ICD-10-CM | POA: Diagnosis not present

## 2018-05-23 ENCOUNTER — Telehealth: Payer: Self-pay | Admitting: Neurology

## 2018-05-23 NOTE — Telephone Encounter (Signed)
I called the patient on the home number and cell number, unable to reach him.  I will try to call back later.  The patient does appear to have spinal stenosis, some evidence of increased cord signal at the C5-6 level, the patient may require neurosurgical evaluation.   MRI cervical 05/22/18:  IMPRESSION: 1. Severe central canal stenosis at C4-5 and C5-6 with distortion of the cord and abnormal lateral cord signal abnormality. This may reflect myelomalacia secondary to this stenosis or cord ischemia. 2. Moderate central canal stenosis at C3-4 secondary to a leftward disc osteophyte complex and asymmetric facet hypertrophy. 3. Severe left and moderate right foraminal stenosis at C3-4. 4. Severe right and moderate left foraminal stenosis at C4-5. 5. Severe foraminal narrowing bilaterally at C5-6. 6. Mild foraminal narrowing bilaterally at C6-7 secondary to uncovertebral spurring.

## 2018-05-23 NOTE — Telephone Encounter (Signed)
I called the patient. I discussed the MRI report with him. I recommended a NSGY referral, he wishes to "think about it", he will talk to Dr. Krista Blue next week.

## 2018-05-26 ENCOUNTER — Telehealth: Payer: Self-pay | Admitting: Neurology

## 2018-05-26 NOTE — Telephone Encounter (Signed)
Left second message (on both home and mobile numbers).

## 2018-05-26 NOTE — Telephone Encounter (Signed)
Please call patient, MRI cervical spine showed multiple level degenerative disease, most severe at C4-5, C5-6, with distortion of the cord and abnormal cord signal. This will make him a surgical candidate. If he wish, we may move up his follow up appointment time.   IMPRESSION: 1. Severe central canal stenosis at C4-5 and C5-6 with distortion of the cord and abnormal lateral cord signal abnormality. This may reflect myelomalacia secondary to this stenosis or cord ischemia. 2. Moderate central canal stenosis at C3-4 secondary to a leftward disc osteophyte complex and asymmetric facet hypertrophy. 3. Severe left and moderate right foraminal stenosis at C3-4. 4. Severe right and moderate left foraminal stenosis at C4-5. 5. Severe foraminal narrowing bilaterally at C5-6. 6. Mild foraminal narrowing bilaterally at C6-7 secondary to uncovertebral spurring.

## 2018-05-26 NOTE — Telephone Encounter (Addendum)
Left message requesting a return call (tried both home and mobile number).

## 2018-05-27 DIAGNOSIS — C44311 Basal cell carcinoma of skin of nose: Secondary | ICD-10-CM | POA: Diagnosis not present

## 2018-05-27 NOTE — Telephone Encounter (Signed)
Spoke to his wife on [place] - she is aware of his MRI results.  They would like to come in for further review his scan with Dr. Krista Blue. Offered him an appt on 05/29/18 but he was unable to come at that time.  He has been rescheduled on 06/02/18.

## 2018-06-02 ENCOUNTER — Encounter: Payer: Self-pay | Admitting: Neurology

## 2018-06-02 ENCOUNTER — Ambulatory Visit (INDEPENDENT_AMBULATORY_CARE_PROVIDER_SITE_OTHER): Payer: Medicare Other | Admitting: Neurology

## 2018-06-02 VITALS — BP 122/69 | HR 64 | Ht 71.0 in | Wt 221.0 lb

## 2018-06-02 DIAGNOSIS — M4802 Spinal stenosis, cervical region: Secondary | ICD-10-CM | POA: Insufficient documentation

## 2018-06-02 DIAGNOSIS — M48061 Spinal stenosis, lumbar region without neurogenic claudication: Secondary | ICD-10-CM | POA: Diagnosis not present

## 2018-06-02 NOTE — Progress Notes (Signed)
PATIENT: Alfred Jones DOB: [birth year]  Chief Complaint  Patient presents with  . Spinal Stenosis    He is here to review his MRI findings.      HISTORICAL  Alfred Jones is a 67 years old male, seen in refer by his rheumatologist PA Marella Chimes for evaluation of right leg and the foot pain, initial evaluation was on April 16, 2018.  He had history of rheumatoid arthritis, now taking methotrexate injection 250 mg every week, but still has multiple joints pain,  I saw him initially in November 2017, at that time, he presented with rash between his toes, intense itching, have raised erythematous rash, later the rash will blister, can be the size of nickel, spreading from toes to the top of his foot, similar rash involving his fingers, he was seen by local dermatologist,  Extensive laboratory evaluation in November 2017 mild elevated TSH 5.6, normal CMP, with glucose of 90, creatinine of 1.1, negative ANA, mild elevated uric acid 7.1, normal CBC with hemoglobin of 14 point 5, ESR of 2, vitamin P53 614, folic acid 12 point   His symptoms gradually improved, he is not sure exact diagnosis he had, but since then, he began to notice right leg discomfort, right groin area pressure sensation, radiating pain to his right anterior thigh, right leg deep achy pain, also complains of right-sided low back pain, his right leg pain can be so severe, he actually presented to the emergency room once, ultrasound of right lower extremity showed no evidence of DVT.  He tried to take off his statin without helping, he described fullness of his right groin area,  UPDATE May 01 2018: He still has mild unsteady gait, low back pain, radiating pain to right groin thigh area, in addition, he complains 3 to 4 years history of waking up feel shaky, numbness tingling involving his whole body, bilateral hands tremor, achy pain,  We have personally reviewed MRI of lumbar in July 2019, multilevel degenerative changes,  worst at L4-5 moderate severe central canal stenosis, with potential impingement upon bilateral L5 nerve roots, hypoplastic right facet at L5-S1 with facet arthropathy on the left side,  Ultrasound of abdomen and pelvic was normal  UPDATE June 02 2018: He denies significant neck pain, mild unsteady gait, continue have low back pain, significant bilateral arthritic pain involving bilateral hands from his rheumatoid arthritis, he denies bowel and bladder incontinence.  We personally reviewed MRI of cervical spine on May 22, 2018, severe central spinal canal stenosis at C4-5, C5-6 with distortion of the cord, and abnormal lateral cord signal abnormality, likely reflecting myelomalacia, moderate canal stenosis at C3-4, severe left and moderate right foraminal stenosis at C3-4, C4-5,  REVIEW OF SYSTEMS: Full 14 system review of systems performed and notable only for as above.  ALLERGIES: No Known Allergies  HOME MEDICATIONS: Current Outpatient Medications  Medication Sig Dispense Refill  . alfuzosin (UROXATRAL) 10 MG 24 hr tablet Take 10 mg by mouth daily with breakfast.    . Apremilast (OTEZLA) 30 MG TABS Take 1 tablet by mouth 2 (two) times daily.    . citalopram (CELEXA) 20 MG tablet   3  . Docusate Calcium (STOOL SOFTENER PO) Take by mouth as needed.    . DULoxetine (CYMBALTA) 60 MG capsule Take 1 capsule (60 mg total) by mouth daily. 30 capsule 11  . Flaxseed, Linseed, (FLAX SEED OIL PO) Take by mouth.    . folic acid (FOLVITE) 1 MG tablet Take  1 mg by mouth 2 (two) times daily.    Marland Kitchen gabapentin (NEURONTIN) 400 MG capsule Take 400 mg by mouth at bedtime.  3  . Melatonin 10 MG TABS Take by mouth at bedtime as needed.    . methotrexate 250 MG/10ML injection Inject into the vein once a week.    . naproxen sodium (ALEVE) 220 MG tablet Take 220 mg by mouth daily as needed.    . Omega-3 Fatty Acids (FISH OIL PO) Take 2,000 mg by mouth 2 (two) times daily.    . ranitidine (ZANTAC) 150 MG  tablet Take 150 mg by mouth 2 (two) times daily.    . simvastatin (ZOCOR) 20 MG tablet Take 1 tablet (20 mg total) by mouth at bedtime. 30 tablet 3   No current facility-administered medications for this visit.     PAST MEDICAL HISTORY: Past Medical History:  Diagnosis Date  . Acid reflux   . Enlarged prostate   . Hyperlipidemia   . Numbness     PAST SURGICAL HISTORY: Past Surgical History:  Procedure Laterality Date  . TONSILLECTOMY     as a child    FAMILY HISTORY: Family History  Problem Relation Age of Onset  . Heart disease Mother   . Diabetes Mother   . Heart attack Mother   . Heart disease Father   . Diabetes Father     SOCIAL HISTORY:  Social History   Socioeconomic History  . Marital status: Married    Spouse name: Not on file  . Number of children: 2  . Years of education: 27  . Highest education level: Not on file  Occupational History  . Occupation: Manufacturing systems engineer  . Financial resource strain: Not on file  . Food insecurity:    Worry: Not on file    Inability: Not on file  . Transportation needs:    Medical: Not on file    Non-medical: Not on file  Tobacco Use  . Smoking status: Former Smoker    Packs/day: 1.00    Years: 16.00    Pack years: 16.00    Types: Cigarettes    Last attempt to quit: 10/22/1986    Years since quitting: 31.6  . Smokeless tobacco: Never Used  Substance and Sexual Activity  . Alcohol use: No  . Drug use: No  . Sexual activity: Not on file  Lifestyle  . Physical activity:    Days per week: Not on file    Minutes per session: Not on file  . Stress: Not on file  Relationships  . Social connections:    Talks on phone: Not on file    Gets together: Not on file    Attends religious service: Not on file    Active member of club or organization: Not on file    Attends meetings of clubs or organizations: Not on file    Relationship status: Not on file  . Intimate partner violence:    Fear of current or ex  partner: Not on file    Emotionally abused: Not on file    Physically abused: Not on file    Forced sexual activity: Not on file  Other Topics Concern  . Not on file  Social History Narrative   Lives at home with his wife and son.   Right-handed.   2-4 cups caffeine per day.     PHYSICAL EXAM   Vitals:   06/02/18 1032  BP: 122/69  Pulse: 64  Weight: 221  lb (100.2 kg)  Height: 5' 11"  (1.803 m)    Not recorded      Body mass index is 30.82 kg/m.  PHYSICAL EXAMNIATION:  Gen: NAD, conversant, well nourised, obese, well groomed                     Cardiovascular: Regular rate rhythm, no peripheral edema, warm, nontender. Eyes: Conjunctivae clear without exudates or hemorrhage Neck: Supple, no carotid bruits. Pulmonary: Clear to auscultation bilaterally   NEUROLOGICAL EXAM:  MENTAL STATUS: Speech:    Speech is normal; fluent and spontaneous with normal comprehension.  Cognition:     Orientation to time, place and person     Normal recent and remote memory     Normal Attention span and concentration     Normal Language, naming, repeating,spontaneous speech     Fund of knowledge   CRANIAL NERVES: CN II: Visual fields are full to confrontation. Fundoscopic exam is normal with sharp discs and no vascular changes. Pupils are round equal and briskly reactive to light. CN III, IV, VI: extraocular movement are normal. No ptosis. CN V: Facial sensation is intact to pinprick in all 3 divisions bilaterally. Corneal responses are intact.  CN VII: Face is symmetric with normal eye closure and smile. CN VIII: Hearing is normal to rubbing fingers CN IX, X: Palate elevates symmetrically. Phonation is normal. CN XI: Head turning and shoulder shrug are intact CN XII: Tongue is midline with normal movements and no atrophy.  MOTOR: He has mild bilateral toe extension/flexion weakness.  He has no significant proximal hip flexion, knee flexion, extension weakness.     REFLEXES: Reflexes are 3 and symmetric at the biceps, 3 trace at triceps, 3/3 knees, trace at ankles. Plantar responses are flexor.  SENSORY: Intact to light touch, pinprick, positional sensation and vibratory sensation are intact in fingers and toes.  COORDINATION: Rapid alternating movements and fine finger movements are intact. There is no dysmetria on finger-to-nose and heel-knee-shin.    GAIT/STANCE: He has mild unsteady gait, mild difficulty perform tiptoe walking  DIAGNOSTIC DATA (LABS, IMAGING, TESTING) - I reviewed patient records, labs, notes, testing and imaging myself where available.  ASSESSMENT AND PLAN  SANAV REMER is a 67 y.o. male   Lumbar stenosis  Mostly severe at L4-5 level  He denies intractable pain, no bowel bladder incontinence, does have mild distal weakness,  Not a surgery candidate at this point   Cervical spondylitic myelopathy  Mostly severe central canal stenosis at the C4-5, C5-6, with distortion of the cord, and abnormal cord signal  Refer him to neurosurgeon for evaluation,  Marcial Pacas, M.D. Ph.D.  [facility] [address], [place], [place] [address] Ph: [phone] Fax: [phone]  CC: Referring Provider

## 2018-06-04 ENCOUNTER — Telehealth: Payer: Self-pay | Admitting: Neurology

## 2018-06-04 MED ORDER — GABAPENTIN 100 MG PO CAPS: ORAL_CAPSULE | ORAL | 11 refills | Status: DC

## 2018-06-04 NOTE — Telephone Encounter (Signed)
Spoke to patient's wife on [place] - states patient is having increased lower back pain radiating to his bilateral legs.  She describes it to be more of a burning, stinging pain rather than aching/throbbing.  The patient feels duloxetine 60mg  daily provides some relief.  He also feels gabapentin 400mg  at bedtime is helpful.  He is having more breakthrough pain during the day.

## 2018-06-04 NOTE — Telephone Encounter (Signed)
Patient's wife Benjamine Mola (on [place]) calling to discuss having his  DULoxetine (CYMBALTA) 60 MG capsule dosage increased. Patient feels like it does not help like it should.Patient uses [facility] in [place].

## 2018-06-04 NOTE — Telephone Encounter (Signed)
Per vo by Dr. Krista Blue, provide rx for gabapentin 100mg , take up take three capsules TID.  Rx sent to pharmacy with request to d/c refills on file for the 400mg  dosage.  They verbalized understanding that he can use use more at bedtime and less during the day or vice versa, if needed.  No more than #9 capsules per day.

## 2018-06-04 NOTE — Addendum Note (Signed)
Addended by: Noberto Retort C on: 06/04/2018 11:51 AM   Modules accepted: Orders

## 2018-06-13 ENCOUNTER — Ambulatory Visit: Payer: Medicare Other | Admitting: Urology

## 2018-06-25 ENCOUNTER — Ambulatory Visit: Payer: Medicare Other | Admitting: Neurology

## 2018-06-25 DIAGNOSIS — Z029 Encounter for administrative examinations, unspecified: Secondary | ICD-10-CM | POA: Diagnosis not present

## 2018-07-29 DIAGNOSIS — L4050 Arthropathic psoriasis, unspecified: Secondary | ICD-10-CM | POA: Diagnosis not present

## 2018-07-29 DIAGNOSIS — Z23 Encounter for immunization: Secondary | ICD-10-CM | POA: Diagnosis not present

## 2018-07-29 DIAGNOSIS — M2550 Pain in unspecified joint: Secondary | ICD-10-CM | POA: Diagnosis not present

## 2018-07-29 DIAGNOSIS — M150 Primary generalized (osteo)arthritis: Secondary | ICD-10-CM | POA: Diagnosis not present

## 2018-07-29 DIAGNOSIS — L603 Nail dystrophy: Secondary | ICD-10-CM | POA: Diagnosis not present

## 2018-07-29 DIAGNOSIS — E669 Obesity, unspecified: Secondary | ICD-10-CM | POA: Diagnosis not present

## 2018-07-29 DIAGNOSIS — D0439 Carcinoma in situ of skin of other parts of face: Secondary | ICD-10-CM | POA: Diagnosis not present

## 2018-07-29 DIAGNOSIS — M1990 Unspecified osteoarthritis, unspecified site: Secondary | ICD-10-CM | POA: Diagnosis not present

## 2018-07-29 DIAGNOSIS — Z6831 Body mass index (BMI) 31.0-31.9, adult: Secondary | ICD-10-CM | POA: Diagnosis not present

## 2018-07-30 DIAGNOSIS — M542 Cervicalgia: Secondary | ICD-10-CM | POA: Diagnosis not present

## 2018-07-30 DIAGNOSIS — Z6831 Body mass index (BMI) 31.0-31.9, adult: Secondary | ICD-10-CM | POA: Diagnosis not present

## 2018-07-30 DIAGNOSIS — G992 Myelopathy in diseases classified elsewhere: Secondary | ICD-10-CM | POA: Diagnosis not present

## 2018-07-30 DIAGNOSIS — M4802 Spinal stenosis, cervical region: Secondary | ICD-10-CM | POA: Diagnosis not present

## 2018-07-30 DIAGNOSIS — M5412 Radiculopathy, cervical region: Secondary | ICD-10-CM | POA: Diagnosis not present

## 2018-07-30 DIAGNOSIS — M5020 Other cervical disc displacement, unspecified cervical region: Secondary | ICD-10-CM | POA: Diagnosis not present

## 2018-07-31 ENCOUNTER — Other Ambulatory Visit: Payer: Self-pay | Admitting: Neurosurgery

## 2018-08-01 ENCOUNTER — Ambulatory Visit (INDEPENDENT_AMBULATORY_CARE_PROVIDER_SITE_OTHER): Payer: Medicare Other | Admitting: Urology

## 2018-08-01 DIAGNOSIS — R3912 Poor urinary stream: Secondary | ICD-10-CM

## 2018-08-01 DIAGNOSIS — N5201 Erectile dysfunction due to arterial insufficiency: Secondary | ICD-10-CM | POA: Diagnosis not present

## 2018-08-01 DIAGNOSIS — N401 Enlarged prostate with lower urinary tract symptoms: Secondary | ICD-10-CM

## 2018-08-18 ENCOUNTER — Ambulatory Visit: Payer: Medicare Other | Admitting: Neurology

## 2018-08-29 IMAGING — MR MR CERVICAL SPINE W/O CM
4 of 5 series · 27 of 48 positions shown · non-contrast
Comparison: None.

CLINICAL DATA: Spinal stenosis of the lumbar region, unspecified
with a neurogenic claudication present. Gait abnormality. Right
lower extremity pain to the foot.

EXAM:
MRI CERVICAL SPINE WITHOUT CONTRAST
TECHNIQUE: Multiplanar, multisequence MR imaging of the cervical spine was
performed. No intravenous contrast was administered.

[Series 6: T1 · sagittal · 3.0mm · 0.66mm/px · 6 of 15 slices shown]
[im 1/15]
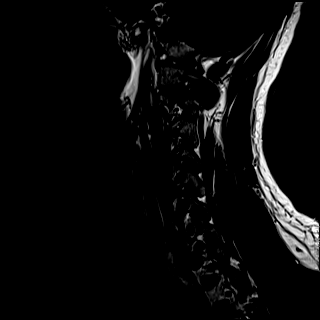
[im 3/15]
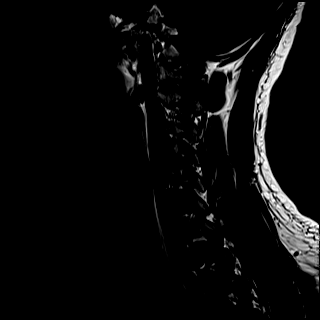
[im 6/15]
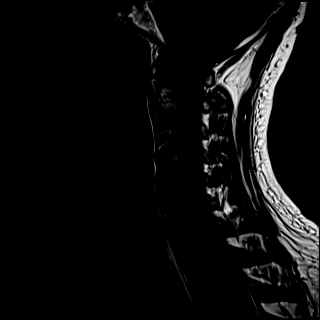
[im 9/15]
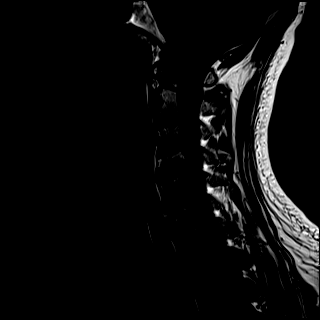
[im 12/15]
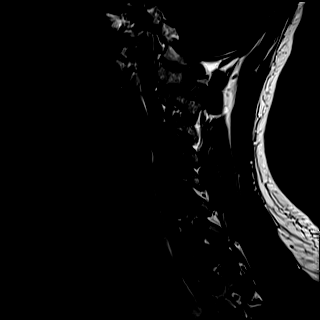
[im 15/15]
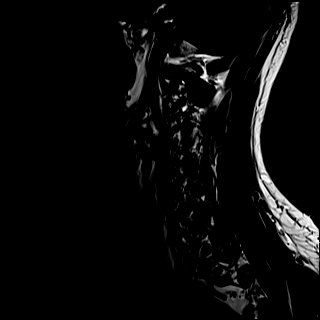

[Series 7: T2 · sagittal · 3.0mm · 0.55mm/px · 7 of 15 slices shown (1 of 2)]
[im 1/15]
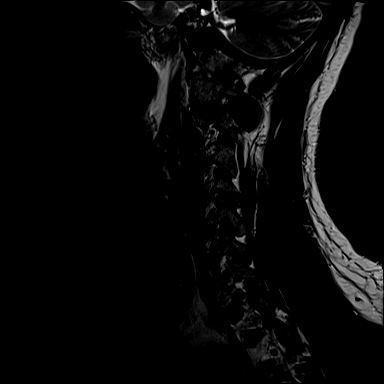
[im 3/15]
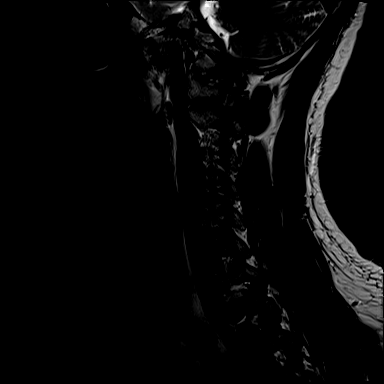
[im 5/15]
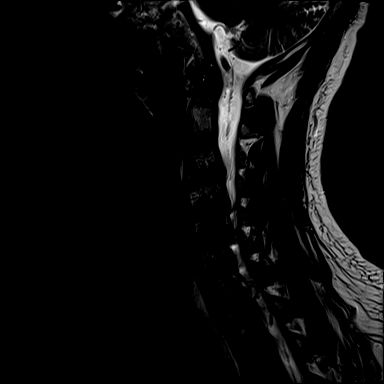
[im 8/15]
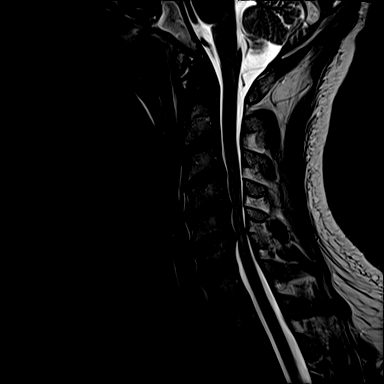
[im 10/15]
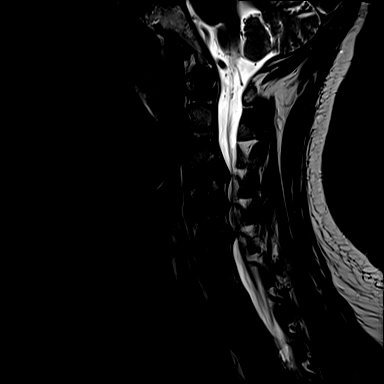
[im 12/15]
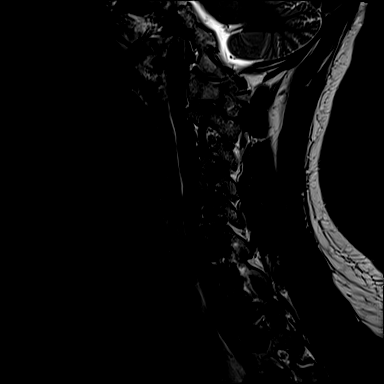
[im 15/15]
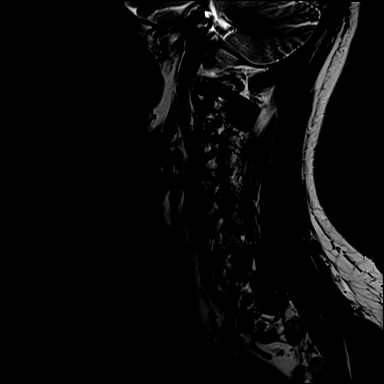

[Series 8: STIR · sagittal · 3.0mm · 0.33mm/px · 6 of 15 slices shown]
[im 1/15]
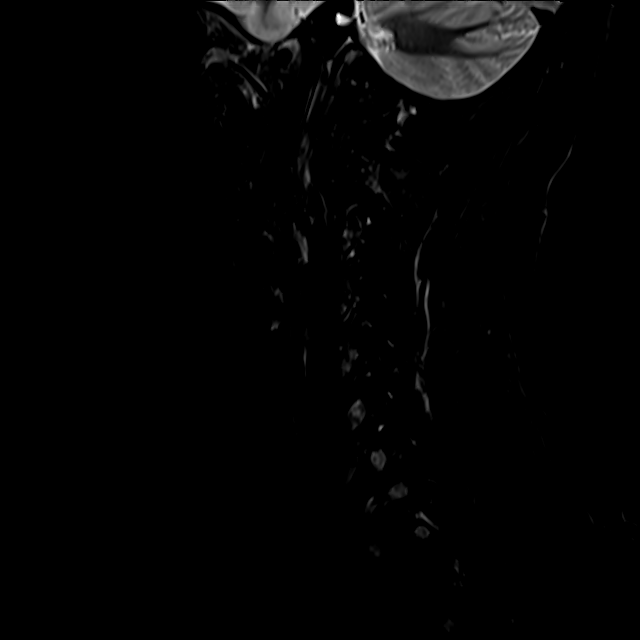
[im 3/15]
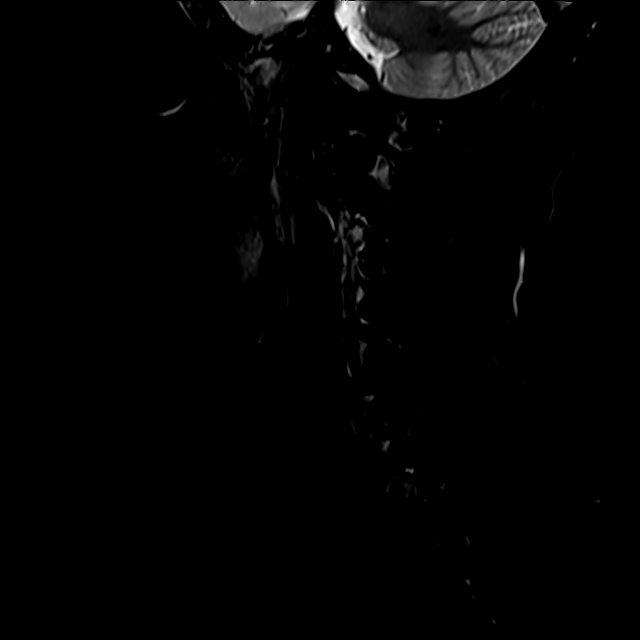
[im 5/15]
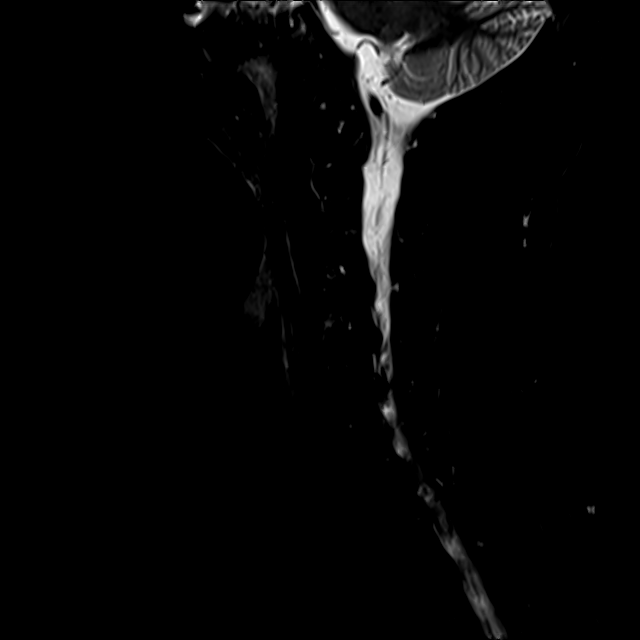
[im 8/15]
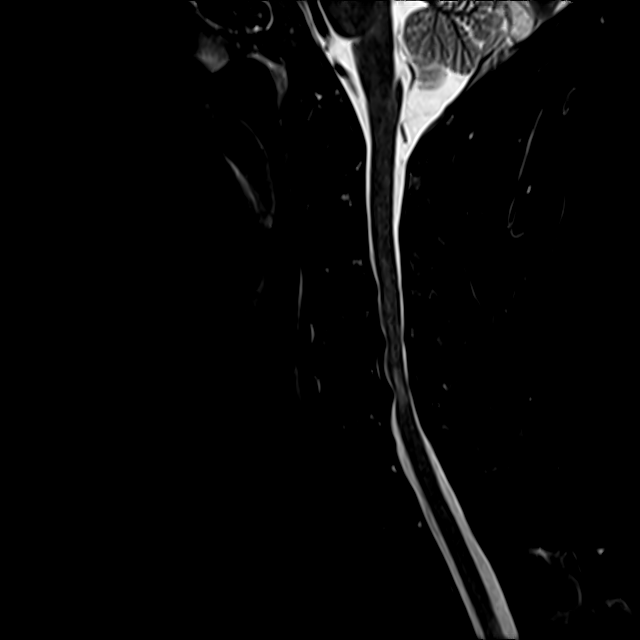
[im 10/15]
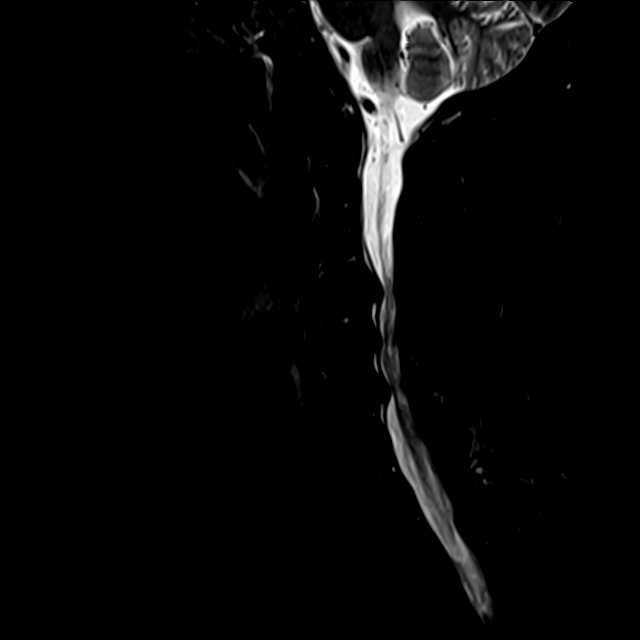
[im 12/15]
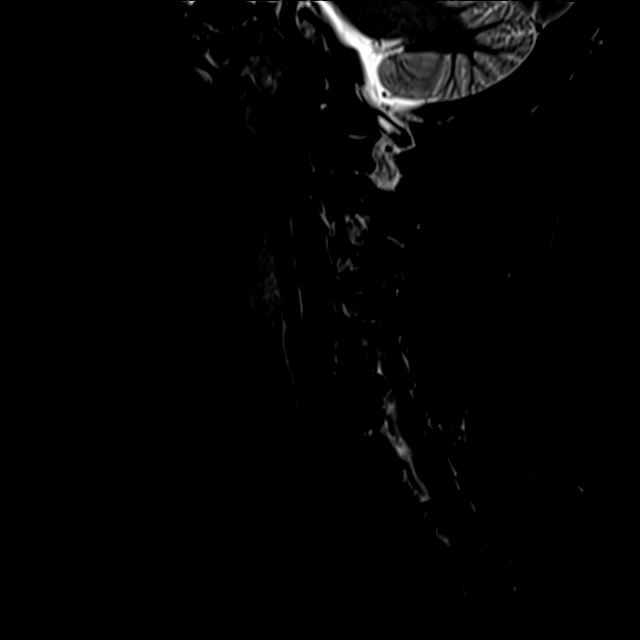

[Series 9: T2 · axial · 3.0mm · 0.50mm/px · z∈[-64,+37]mm · 8 of 32 slices shown (2 of 2)]
[im 1/32]
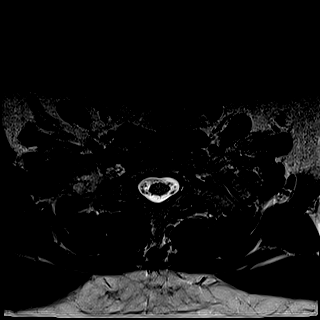
[im 5/32]
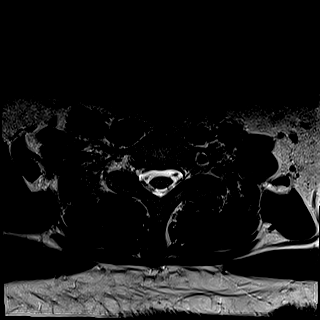
[im 10/32]
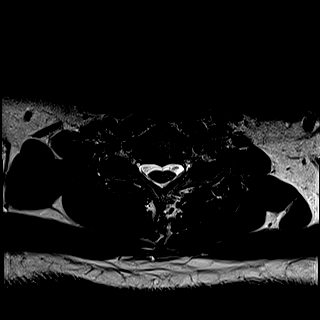
[im 15/32]
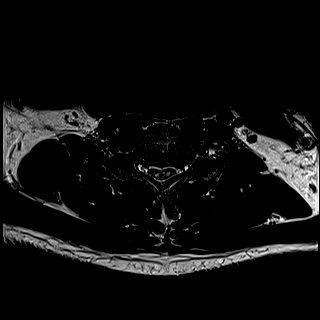
[im 17/32]
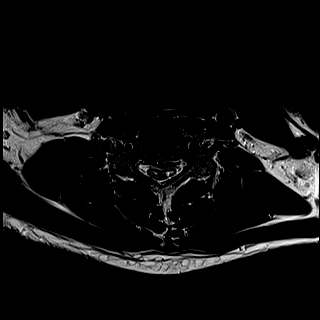
[im 22/32]
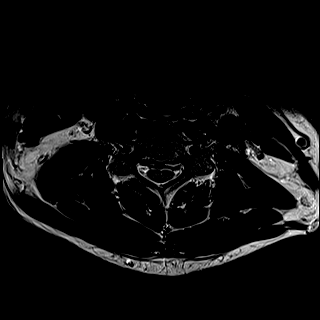
[im 27/32]
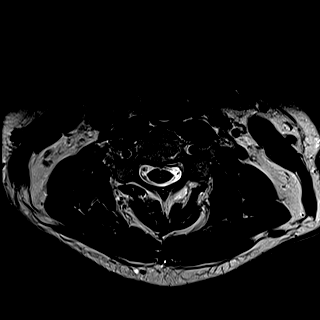
[im 32/32]
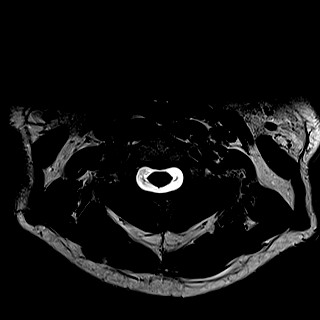

[27 of 48 positions shown; findings below may reference images not displayed]

FINDINGS: Alignment: Slight degenerative retrolisthesis is present at C3-4,
C4-5, and C5-6.

Vertebrae: Mild endplate marrow changes at the same levels are most
pronounced at C5-6.

Cord: Lateral T2 cord signal abnormality is present at C4-5 and
C5-6. There is thinning of the cord at both levels. No other focal
cord signal abnormality or distortion is present.

Posterior Fossa, vertebral arteries, paraspinal tissues: The
craniocervical junction is normal. The visualized intracranial
contents are normal. Flow is present in the vertebral arteries. The
visualized paraspinous soft tissues are unremarkable.

Disc levels:

C2-3: Negative.

C3-4: A leftward disc osteophyte complex is present. There is
effacement of the ventral CSF. The canal measures 8 mm in the
midline. Severe left and moderate right foraminal stenosis is due to
uncovertebral and facet disease, worse on the left.

C4-5: A broad-based disc osteophyte complex is present. There is
effacement of ventral CSF and distortion of the ventral surface of
the cord. The canal measures 6 mm in AP diameter. Uncovertebral and
facet disease leads to severe right and moderate left foraminal
stenosis.

C5-6: A broad-based disc osteophyte complex is present. The canal is
narrowed to 7 mm in the midline. Severe foraminal stenosis is
present bilaterally.

C6-7: A broad-based disc osteophyte complex is present.
Uncovertebral spurring leads to mild foraminal narrowing
bilaterally.

C7-T1: Negative.
IMPRESSION: 1. Severe central canal stenosis at C4-5 and C5-6 with distortion of
the cord and abnormal lateral cord signal abnormality. This may
reflect myelomalacia secondary to this stenosis or cord ischemia.
2. Moderate central canal stenosis at C3-4 secondary to a leftward
disc osteophyte complex and asymmetric facet hypertrophy.
3. Severe left and moderate right foraminal stenosis at C3-4.
4. Severe right and moderate left foraminal stenosis at C4-5.
5. Severe foraminal narrowing bilaterally at C5-6.
6. Mild foraminal narrowing bilaterally at C6-7 secondary to
uncovertebral spurring.

## 2018-09-12 NOTE — Pre-Procedure Instructions (Signed)
Alfred Jones  09/12/2018      [facility] [address] [place] [address] Phone: [phone] Fax: [phone]    Your procedure is scheduled on Tues., Dec. 3, 2019 from 7:30AM-10:27AM  Report to [facility] Admitting Entrance "A" at 5:30AM  Call this number if you have problems the morning of surgery:  [phone]   Remember:  Do not eat or drink after midnight on Dec. 2nd    Take these medicines the morning of surgery with A SIP OF WATER: Alfuzosin (UROXATRAL), Apremilast (OTEZLA), DULoxetine (CYMBALTA), and Gabapentin (NEURONTIN)  7 days before surgery (09/16/18), stop taking all Other Aspirin Products, Vitamins, Fish oils, and Herbal medications. Also stop all NSAIDS i.e. Advil, Ibuprofen, Motrin, Aleve, Anaprox, Naproxen, BC, Goody Powders, and all Supplements. Including: Melatonin     Do not wear jewelry.  Do not wear lotions, powders, colognes, or deodorant.  Do not shave 48 hours prior to surgery.  Men may shave face.  Do not bring valuables to the hospital.  [name] is not responsible for any belongings or valuables.  Contacts, dentures or bridgework may not be worn into surgery.  Leave your suitcase in the car.  After surgery it may be brought to your room.  For patients admitted to the hospital, discharge time will be determined by your treatment team.  Patients discharged the day of surgery will not be allowed to drive home.   Special instructions:   [name]- Preparing For Surgery  Before surgery, you can play an important role. Because skin is not sterile, your skin needs to be as free of germs as possible. You can reduce the number of germs on your skin by washing with CHG (chlorahexidine gluconate) Soap before surgery.  CHG is an antiseptic cleaner which kills germs and bonds with the skin to continue killing germs even after washing.    Oral Hygiene is also important to reduce your risk of  infection.  Remember - BRUSH YOUR TEETH THE MORNING OF SURGERY WITH YOUR REGULAR TOOTHPASTE  Please do not use if you have an allergy to CHG or antibacterial soaps. If your skin becomes reddened/irritated stop using the CHG.  Do not shave (including legs and underarms) for at least 48 hours prior to first CHG shower. It is OK to shave your face.  Please follow these instructions carefully.   1. Shower the NIGHT BEFORE SURGERY and the MORNING OF SURGERY with CHG.   2. If you chose to wash your hair, wash your hair first as usual with your normal shampoo.  3. After you shampoo, rinse your hair and body thoroughly to remove the shampoo.  4. Use CHG as you would any other liquid soap. You can apply CHG directly to the skin and wash gently with a scrungie or a clean washcloth.   5. Apply the CHG Soap to your body ONLY FROM THE NECK DOWN.  Do not use on open wounds or open sores. Avoid contact with your eyes, ears, mouth and genitals (private parts). Wash Face and genitals (private parts)  with your normal soap.  6. Wash thoroughly, paying special attention to the area where your surgery will be performed.  7. Thoroughly rinse your body with warm water from the neck down.  8. DO NOT shower/wash with your normal soap after using and rinsing off the CHG Soap.  9. Pat yourself dry with a CLEAN TOWEL.  10.  Wear CLEAN PAJAMAS to bed the night before surgery, wear comfortable clothes the morning of surgery  11. Place CLEAN SHEETS on your bed the night of your first shower and DO NOT SLEEP WITH PETS.  Day of Surgery:  Do not apply any deodorants/lotions.  Please wear clean clothes to the hospital/surgery center.   Remember to brush your teeth WITH YOUR REGULAR TOOTHPASTE.  Please read over the following fact sheets that you were given. Pain Booklet, Coughing and Deep Breathing, MRSA Information and Surgical Site Infection Prevention

## 2018-09-15 ENCOUNTER — Encounter (HOSPITAL_COMMUNITY): Payer: Self-pay

## 2018-09-15 ENCOUNTER — Other Ambulatory Visit: Payer: Self-pay

## 2018-09-15 ENCOUNTER — Encounter (HOSPITAL_COMMUNITY)
Admission: RE | Admit: 2018-09-15 | Discharge: 2018-09-15 | Disposition: A | Discharge status: Home / Self Care | Payer: Medicare Other | Source: Ambulatory Visit | Attending: Neurosurgery | Admitting: Neurosurgery

## 2018-09-15 DIAGNOSIS — Z01812 Encounter for preprocedural laboratory examination: Secondary | ICD-10-CM | POA: Insufficient documentation

## 2018-09-15 HISTORY — DX: Unspecified osteoarthritis, unspecified site: M19.90

## 2018-09-15 HISTORY — DX: Anxiety disorder, unspecified: F41.9

## 2018-09-15 HISTORY — DX: Major depressive disorder, single episode, unspecified: F32.9

## 2018-09-15 HISTORY — DX: Depression, unspecified: F32.A

## 2018-09-15 HISTORY — DX: Sleep apnea, unspecified: G47.30

## 2018-09-15 LAB — CBC
HEMATOCRIT: 43.8 % (ref 39.0–52.0)
HEMOGLOBIN: 13.7 g/dL (ref 13.0–17.0)
MCH: 31.9 pg (ref 26.0–34.0)
MCHC: 31.3 g/dL (ref 30.0–36.0)
MCV: 101.9 fL — ABNORMAL HIGH (ref 80.0–100.0)
Platelets: 426 10*3/uL — ABNORMAL HIGH (ref 150–400)
RBC: 4.3 MIL/uL (ref 4.22–5.81)
RDW: 12.9 % (ref 11.5–15.5)
WBC: 7 10*3/uL (ref 4.0–10.5)
nRBC: 0 % (ref 0.0–0.2)

## 2018-09-15 LAB — BASIC METABOLIC PANEL
Anion gap: 6 (ref 5–15)
BUN: 15 mg/dL (ref 8–23)
CHLORIDE: 110 mmol/L (ref 98–111)
CO2: 23 mmol/L (ref 22–32)
CREATININE: 0.94 mg/dL (ref 0.61–1.24)
Calcium: 8.6 mg/dL — ABNORMAL LOW (ref 8.9–10.3)
GFR calc Af Amer: >60 mL/min (ref >60)
GFR calc non Af Amer: >60 mL/min (ref >60)
Glucose, Bld: 92 mg/dL (ref 70–99)
POTASSIUM: 4.3 mmol/L (ref 3.5–5.1)
Sodium: 139 mmol/L (ref 135–145)

## 2018-09-15 LAB — SURGICAL PCR SCREEN
MRSA, PCR: NEGATIVE
STAPHYLOCOCCUS AUREUS: NEGATIVE

## 2018-09-15 LAB — TYPE AND SCREEN
ABO/RH(D): O POS
ANTIBODY SCREEN: NEGATIVE

## 2018-09-15 LAB — ABO/RH: ABO/RH(D): O POS

## 2018-09-15 NOTE — Progress Notes (Signed)
PCP is Dr. Doristine Johns in [place] 08/2017 He states that approx 20 yrs ago, had heart cath for c/o of cp--non cardiac issue.  Hasn't had reoccurence and hasn't been back since. Denies murmur, cp, sob. Does have OSA

## 2018-09-23 ENCOUNTER — Inpatient Hospital Stay (HOSPITAL_COMMUNITY): Payer: Medicare Other | Admitting: Registered Nurse

## 2018-09-23 ENCOUNTER — Encounter (HOSPITAL_COMMUNITY)
Admission: RE | Disposition: A | Discharge status: Home / Self Care | Payer: Self-pay | Source: Ambulatory Visit | Attending: Neurosurgery

## 2018-09-23 ENCOUNTER — Inpatient Hospital Stay (HOSPITAL_COMMUNITY): Payer: Medicare Other

## 2018-09-23 ENCOUNTER — Inpatient Hospital Stay (HOSPITAL_COMMUNITY)
Admission: RE | Admit: 2018-09-23 | Discharge: 2018-09-24 | DRG: 472 | Disposition: A | Discharge status: Home / Self Care | Payer: Medicare Other | Source: Ambulatory Visit | Attending: Neurosurgery | Admitting: Neurosurgery

## 2018-09-23 DIAGNOSIS — F329 Major depressive disorder, single episode, unspecified: Secondary | ICD-10-CM | POA: Diagnosis present

## 2018-09-23 DIAGNOSIS — L4050 Arthropathic psoriasis, unspecified: Secondary | ICD-10-CM | POA: Diagnosis present

## 2018-09-23 DIAGNOSIS — M5020 Other cervical disc displacement, unspecified cervical region: Secondary | ICD-10-CM | POA: Diagnosis present

## 2018-09-23 DIAGNOSIS — Z419 Encounter for procedure for purposes other than remedying health state, unspecified: Secondary | ICD-10-CM

## 2018-09-23 DIAGNOSIS — M5001 Cervical disc disorder with myelopathy,  high cervical region: Secondary | ICD-10-CM | POA: Diagnosis not present

## 2018-09-23 DIAGNOSIS — M1990 Unspecified osteoarthritis, unspecified site: Secondary | ICD-10-CM | POA: Diagnosis not present

## 2018-09-23 DIAGNOSIS — M48061 Spinal stenosis, lumbar region without neurogenic claudication: Secondary | ICD-10-CM | POA: Diagnosis present

## 2018-09-23 DIAGNOSIS — G709 Myoneural disorder, unspecified: Secondary | ICD-10-CM | POA: Diagnosis present

## 2018-09-23 DIAGNOSIS — M4322 Fusion of spine, cervical region: Secondary | ICD-10-CM | POA: Diagnosis not present

## 2018-09-23 DIAGNOSIS — Z6831 Body mass index (BMI) 31.0-31.9, adult: Secondary | ICD-10-CM | POA: Diagnosis not present

## 2018-09-23 DIAGNOSIS — F419 Anxiety disorder, unspecified: Secondary | ICD-10-CM | POA: Diagnosis present

## 2018-09-23 DIAGNOSIS — G4730 Sleep apnea, unspecified: Secondary | ICD-10-CM | POA: Diagnosis present

## 2018-09-23 DIAGNOSIS — M5000 Cervical disc disorder with myelopathy, unspecified cervical region: Secondary | ICD-10-CM | POA: Diagnosis present

## 2018-09-23 DIAGNOSIS — Z79899 Other long term (current) drug therapy: Secondary | ICD-10-CM | POA: Diagnosis not present

## 2018-09-23 DIAGNOSIS — Z9181 History of falling: Secondary | ICD-10-CM

## 2018-09-23 DIAGNOSIS — M50021 Cervical disc disorder at C4-C5 level with myelopathy: Secondary | ICD-10-CM | POA: Diagnosis not present

## 2018-09-23 DIAGNOSIS — M5412 Radiculopathy, cervical region: Secondary | ICD-10-CM | POA: Diagnosis not present

## 2018-09-23 DIAGNOSIS — M069 Rheumatoid arthritis, unspecified: Secondary | ICD-10-CM | POA: Diagnosis present

## 2018-09-23 DIAGNOSIS — N400 Enlarged prostate without lower urinary tract symptoms: Secondary | ICD-10-CM | POA: Diagnosis not present

## 2018-09-23 DIAGNOSIS — M4802 Spinal stenosis, cervical region: Secondary | ICD-10-CM | POA: Diagnosis not present

## 2018-09-23 DIAGNOSIS — E669 Obesity, unspecified: Secondary | ICD-10-CM | POA: Diagnosis not present

## 2018-09-23 DIAGNOSIS — M5011 Cervical disc disorder with radiculopathy,  high cervical region: Secondary | ICD-10-CM | POA: Diagnosis not present

## 2018-09-23 HISTORY — PX: ANTERIOR CERVICAL DECOMP/DISCECTOMY FUSION: SHX1161

## 2018-09-23 SURGERY — ANTERIOR CERVICAL DECOMPRESSION/DISCECTOMY FUSION 3 LEVELS
Anesthesia: General | Site: Spine Cervical

## 2018-09-23 MED ORDER — MIDAZOLAM HCL 5 MG/5ML IJ SOLN
INTRAMUSCULAR | Status: DC | PRN
  Administered 2018-09-23: 2 mg via INTRAVENOUS

## 2018-09-23 MED ORDER — SODIUM CHLORIDE 0.9% FLUSH: 3.0000 mL | INTRAVENOUS | Status: DC | PRN

## 2018-09-23 MED ORDER — LIDOCAINE-EPINEPHRINE 1 %-1:100000 IJ SOLN
INTRAMUSCULAR | Status: DC | PRN
  Administered 2018-09-23: 7 mL

## 2018-09-23 MED ORDER — CEFAZOLIN SODIUM-DEXTROSE 2-4 GM/100ML-% IV SOLN
2.0000 g | INTRAVENOUS | Status: AC
  Administered 2018-09-23: 2 g via INTRAVENOUS
  Filled 2018-09-23: qty 100

## 2018-09-23 MED ORDER — ALUM & MAG HYDROXIDE-SIMETH 200-200-20 MG/5ML PO SUSP: 30.0000 mL | Freq: Four times a day (QID) | ORAL | Status: DC | PRN

## 2018-09-23 MED ORDER — MENTHOL 3 MG MT LOZG: 1.0000 | LOZENGE | OROMUCOSAL | Status: DC | PRN

## 2018-09-23 MED ORDER — ACETAMINOPHEN 325 MG PO TABS: 650.0000 mg | ORAL_TABLET | ORAL | Status: DC | PRN

## 2018-09-23 MED ORDER — ONDANSETRON HCL 4 MG/2ML IJ SOLN
INTRAMUSCULAR | Status: AC
  Filled 2018-09-23: qty 2

## 2018-09-23 MED ORDER — MORPHINE SULFATE (PF) 2 MG/ML IV SOLN: 2.0000 mg | INTRAVENOUS | Status: DC | PRN

## 2018-09-23 MED ORDER — METHOCARBAMOL 500 MG PO TABS
ORAL_TABLET | ORAL | Status: AC
  Filled 2018-09-23: qty 1

## 2018-09-23 MED ORDER — CALCIUM CARBONATE ANTACID 500 MG PO CHEW
1.0000 | CHEWABLE_TABLET | Freq: Three times a day (TID) | ORAL | Status: DC
  Administered 2018-09-23 (×2): 200 mg via ORAL
  Filled 2018-09-23 (×6): qty 1

## 2018-09-23 MED ORDER — HYDROCODONE-ACETAMINOPHEN 5-325 MG PO TABS: 1.0000 | ORAL_TABLET | ORAL | Status: DC | PRN

## 2018-09-23 MED ORDER — FENTANYL CITRATE (PF) 100 MCG/2ML IJ SOLN
25.0000 ug | INTRAMUSCULAR | Status: DC | PRN
  Administered 2018-09-23 (×4): 25 ug via INTRAVENOUS

## 2018-09-23 MED ORDER — PANTOPRAZOLE SODIUM 40 MG IV SOLR
40.0000 mg | Freq: Every day | INTRAVENOUS | Status: DC
  Administered 2018-09-23: 40 mg via INTRAVENOUS
  Filled 2018-09-23: qty 40

## 2018-09-23 MED ORDER — OXYCODONE HCL 5 MG PO TABS
5.0000 mg | ORAL_TABLET | Freq: Once | ORAL | Status: AC | PRN
  Administered 2018-09-23: 5 mg via ORAL

## 2018-09-23 MED ORDER — OXYCODONE HCL 5 MG PO TABS
ORAL_TABLET | ORAL | Status: AC
  Filled 2018-09-23: qty 1

## 2018-09-23 MED ORDER — DEXAMETHASONE SODIUM PHOSPHATE 10 MG/ML IJ SOLN
INTRAMUSCULAR | Status: AC
  Filled 2018-09-23: qty 1

## 2018-09-23 MED ORDER — KCL IN DEXTROSE-NACL 20-5-0.45 MEQ/L-%-% IV SOLN
INTRAVENOUS | Status: AC
  Filled 2018-09-23: qty 1000

## 2018-09-23 MED ORDER — SUGAMMADEX SODIUM 200 MG/2ML IV SOLN
INTRAVENOUS | Status: DC | PRN
  Administered 2018-09-23 (×2): 100 mg via INTRAVENOUS

## 2018-09-23 MED ORDER — FENTANYL CITRATE (PF) 100 MCG/2ML IJ SOLN
INTRAMUSCULAR | Status: DC | PRN
  Administered 2018-09-23: 50 ug via INTRAVENOUS
  Administered 2018-09-23: 100 ug via INTRAVENOUS

## 2018-09-23 MED ORDER — LIDOCAINE 2% (20 MG/ML) 5 ML SYRINGE
INTRAMUSCULAR | Status: DC | PRN
  Administered 2018-09-23: 60 mg via INTRAVENOUS

## 2018-09-23 MED ORDER — DEXAMETHASONE SODIUM PHOSPHATE 10 MG/ML IJ SOLN
INTRAMUSCULAR | Status: DC | PRN
  Administered 2018-09-23: 10 mg via INTRAVENOUS

## 2018-09-23 MED ORDER — PHENOL 1.4 % MT LIQD: 1.0000 | OROMUCOSAL | Status: DC | PRN

## 2018-09-23 MED ORDER — ROCURONIUM BROMIDE 50 MG/5ML IV SOSY
PREFILLED_SYRINGE | INTRAVENOUS | Status: AC
  Filled 2018-09-23: qty 5

## 2018-09-23 MED ORDER — SCOPOLAMINE 1 MG/3DAYS TD PT72
1.0000 | MEDICATED_PATCH | Freq: Once | TRANSDERMAL | Status: DC
  Administered 2018-09-23: 1.5 mg via TRANSDERMAL
  Filled 2018-09-23: qty 1

## 2018-09-23 MED ORDER — ZOLPIDEM TARTRATE 5 MG PO TABS: 5.0000 mg | ORAL_TABLET | Freq: Every evening | ORAL | Status: DC | PRN

## 2018-09-23 MED ORDER — LIDOCAINE 2% (20 MG/ML) 5 ML SYRINGE
INTRAMUSCULAR | Status: AC
  Filled 2018-09-23: qty 5

## 2018-09-23 MED ORDER — 0.9 % SODIUM CHLORIDE (POUR BTL) OPTIME
TOPICAL | Status: DC | PRN
  Administered 2018-09-23: 1000 mL

## 2018-09-23 MED ORDER — OXYCODONE HCL 5 MG PO TABS
10.0000 mg | ORAL_TABLET | ORAL | Status: DC | PRN
  Administered 2018-09-23 – 2018-09-24 (×6): 10 mg via ORAL
  Filled 2018-09-23 (×6): qty 2

## 2018-09-23 MED ORDER — THROMBIN 5000 UNITS EX SOLR
OROMUCOSAL | Status: DC | PRN
  Administered 2018-09-23: 08:00:00

## 2018-09-23 MED ORDER — CHLORHEXIDINE GLUCONATE CLOTH 2 % EX PADS: 6.0000 | MEDICATED_PAD | Freq: Once | CUTANEOUS | Status: DC

## 2018-09-23 MED ORDER — APREMILAST 30 MG PO TABS: 1.0000 | ORAL_TABLET | Freq: Every day | ORAL | Status: DC

## 2018-09-23 MED ORDER — ROCURONIUM BROMIDE 50 MG/5ML IV SOSY
PREFILLED_SYRINGE | INTRAVENOUS | Status: DC | PRN
  Administered 2018-09-23: 20 mg via INTRAVENOUS
  Administered 2018-09-23: 50 mg via INTRAVENOUS

## 2018-09-23 MED ORDER — GABAPENTIN 300 MG PO CAPS: 300.0000 mg | ORAL_CAPSULE | ORAL | Status: DC

## 2018-09-23 MED ORDER — KCL IN DEXTROSE-NACL 20-5-0.45 MEQ/L-%-% IV SOLN
INTRAVENOUS | Status: DC
  Administered 2018-09-23 (×2): via INTRAVENOUS

## 2018-09-23 MED ORDER — BUPIVACAINE HCL (PF) 0.5 % IJ SOLN
INTRAMUSCULAR | Status: DC | PRN
  Administered 2018-09-23: 7 mL

## 2018-09-23 MED ORDER — POLYETHYLENE GLYCOL 3350 17 G PO PACK: 17.0000 g | PACK | Freq: Every day | ORAL | Status: DC | PRN

## 2018-09-23 MED ORDER — LIDOCAINE-EPINEPHRINE 1 %-1:100000 IJ SOLN
INTRAMUSCULAR | Status: AC
  Filled 2018-09-23: qty 1

## 2018-09-23 MED ORDER — CITALOPRAM HYDROBROMIDE 20 MG PO TABS
20.0000 mg | ORAL_TABLET | Freq: Every day | ORAL | Status: DC
  Administered 2018-09-23: 20 mg via ORAL
  Filled 2018-09-23: qty 1

## 2018-09-23 MED ORDER — ONDANSETRON HCL 4 MG/2ML IJ SOLN: 4.0000 mg | Freq: Four times a day (QID) | INTRAMUSCULAR | Status: DC | PRN

## 2018-09-23 MED ORDER — SODIUM CHLORIDE 0.9% FLUSH
3.0000 mL | Freq: Two times a day (BID) | INTRAVENOUS | Status: DC
  Administered 2018-09-23: 3 mL via INTRAVENOUS

## 2018-09-23 MED ORDER — THROMBIN 5000 UNITS EX SOLR
CUTANEOUS | Status: AC
  Filled 2018-09-23: qty 5000

## 2018-09-23 MED ORDER — FENTANYL CITRATE (PF) 250 MCG/5ML IJ SOLN
INTRAMUSCULAR | Status: AC
  Filled 2018-09-23: qty 5

## 2018-09-23 MED ORDER — SCOPOLAMINE 1 MG/3DAYS TD PT72
MEDICATED_PATCH | TRANSDERMAL | Status: AC
  Administered 2018-09-23: 1.5 mg via TRANSDERMAL
  Filled 2018-09-23: qty 1

## 2018-09-23 MED ORDER — BUPIVACAINE HCL (PF) 0.5 % IJ SOLN
INTRAMUSCULAR | Status: AC
  Filled 2018-09-23: qty 30

## 2018-09-23 MED ORDER — LACTATED RINGERS IV SOLN
INTRAVENOUS | Status: DC | PRN
  Administered 2018-09-23: 07:00:00 via INTRAVENOUS

## 2018-09-23 MED ORDER — MIDAZOLAM HCL 2 MG/2ML IJ SOLN
INTRAMUSCULAR | Status: AC
  Filled 2018-09-23: qty 2

## 2018-09-23 MED ORDER — CEFAZOLIN SODIUM-DEXTROSE 2-4 GM/100ML-% IV SOLN
2.0000 g | Freq: Three times a day (TID) | INTRAVENOUS | Status: AC
  Administered 2018-09-23 – 2018-09-24 (×2): 2 g via INTRAVENOUS
  Filled 2018-09-23 (×2): qty 100

## 2018-09-23 MED ORDER — DOCUSATE SODIUM 100 MG PO CAPS
100.0000 mg | ORAL_CAPSULE | Freq: Two times a day (BID) | ORAL | Status: DC
  Administered 2018-09-23: 100 mg via ORAL
  Filled 2018-09-23: qty 1

## 2018-09-23 MED ORDER — PROPOFOL 10 MG/ML IV BOLUS
INTRAVENOUS | Status: AC
  Filled 2018-09-23: qty 20

## 2018-09-23 MED ORDER — ALFUZOSIN HCL ER 10 MG PO TB24
10.0000 mg | ORAL_TABLET | Freq: Every day | ORAL | Status: DC
  Filled 2018-09-23: qty 1

## 2018-09-23 MED ORDER — DULOXETINE HCL 30 MG PO CPEP
60.0000 mg | ORAL_CAPSULE | Freq: Every day | ORAL | Status: DC
  Administered 2018-09-23: 60 mg via ORAL
  Filled 2018-09-23: qty 2

## 2018-09-23 MED ORDER — ONDANSETRON HCL 4 MG/2ML IJ SOLN
INTRAMUSCULAR | Status: DC | PRN
  Administered 2018-09-23: 4 mg via INTRAVENOUS

## 2018-09-23 MED ORDER — PROPOFOL 10 MG/ML IV BOLUS
INTRAVENOUS | Status: DC | PRN
  Administered 2018-09-23: 160 mg via INTRAVENOUS

## 2018-09-23 MED ORDER — ONDANSETRON HCL 4 MG PO TABS: 4.0000 mg | ORAL_TABLET | Freq: Four times a day (QID) | ORAL | Status: DC | PRN

## 2018-09-23 MED ORDER — METHOCARBAMOL 1000 MG/10ML IJ SOLN
500.0000 mg | Freq: Four times a day (QID) | INTRAVENOUS | Status: DC | PRN
  Filled 2018-09-23: qty 5

## 2018-09-23 MED ORDER — MELATONIN 3 MG PO TABS
9.0000 mg | ORAL_TABLET | Freq: Every evening | ORAL | Status: DC | PRN
  Filled 2018-09-23: qty 3

## 2018-09-23 MED ORDER — FENTANYL CITRATE (PF) 100 MCG/2ML IJ SOLN
INTRAMUSCULAR | Status: AC
  Filled 2018-09-23: qty 2

## 2018-09-23 MED ORDER — GABAPENTIN 400 MG PO CAPS
400.0000 mg | ORAL_CAPSULE | Freq: Every day | ORAL | Status: DC
  Administered 2018-09-23: 400 mg via ORAL
  Filled 2018-09-23: qty 1

## 2018-09-23 MED ORDER — THROMBIN 20000 UNITS EX SOLR
CUTANEOUS | Status: AC
  Filled 2018-09-23: qty 20000

## 2018-09-23 MED ORDER — THROMBIN 20000 UNITS EX SOLR
CUTANEOUS | Status: DC | PRN
  Administered 2018-09-23: 08:00:00

## 2018-09-23 MED ORDER — ACETAMINOPHEN 650 MG RE SUPP
650.0000 mg | RECTAL | Status: DC | PRN
  Filled 2018-09-23: qty 1

## 2018-09-23 MED ORDER — FOLIC ACID 1 MG PO TABS
1.0000 mg | ORAL_TABLET | Freq: Two times a day (BID) | ORAL | Status: DC
  Administered 2018-09-23: 1 mg via ORAL
  Filled 2018-09-23: qty 1

## 2018-09-23 MED ORDER — OXYCODONE HCL 5 MG/5ML PO SOLN: 5.0000 mg | Freq: Once | ORAL | Status: AC | PRN

## 2018-09-23 MED ORDER — GABAPENTIN 300 MG PO CAPS
300.0000 mg | ORAL_CAPSULE | Freq: Two times a day (BID) | ORAL | Status: DC
  Administered 2018-09-23: 300 mg via ORAL
  Filled 2018-09-23: qty 1

## 2018-09-23 MED ORDER — BISACODYL 10 MG RE SUPP: 10.0000 mg | Freq: Every day | RECTAL | Status: DC | PRN

## 2018-09-23 MED ORDER — DOCUSATE SODIUM 100 MG PO CAPS: 200.0000 mg | ORAL_CAPSULE | Freq: Two times a day (BID) | ORAL | Status: DC

## 2018-09-23 MED ORDER — METHOCARBAMOL 500 MG PO TABS
500.0000 mg | ORAL_TABLET | Freq: Four times a day (QID) | ORAL | Status: DC | PRN
  Administered 2018-09-23 – 2018-09-24 (×3): 500 mg via ORAL
  Filled 2018-09-23 (×2): qty 1

## 2018-09-23 MED ORDER — SODIUM CHLORIDE 0.9 % IV SOLN: 250.0000 mL | INTRAVENOUS | Status: DC

## 2018-09-23 SURGICAL SUPPLY — 75 items
ADH SKN CLS APL DERMABOND .7 (GAUZE/BANDAGES/DRESSINGS) ×1
BASKET BONE COLLECTION (BASKET) IMPLANT
BIT DRILL 14X2.5XNS TI ANT (BIT) IMPLANT
BIT DRILL AVIATOR 14 (BIT) ×2
BIT DRILL AVIATOR 14MM (BIT) ×1
BIT DRILL NEURO 2X3.1 SFT TUCH (MISCELLANEOUS) ×1 IMPLANT
BIT DRL 14X2.5XNS TI ANT (BIT) ×1
BLADE ULTRA TIP 2M (BLADE) IMPLANT
BNDG GAUZE ELAST 4 BULKY (GAUZE/BANDAGES/DRESSINGS) IMPLANT
BUR BARREL STRAIGHT FLUTE 4.0 (BURR) ×5 IMPLANT
CANISTER SUCT 3000ML PPV (MISCELLANEOUS) ×3 IMPLANT
CARTRIDGE OIL MAESTRO DRILL (MISCELLANEOUS) ×1 IMPLANT
COVER MAYO STAND STRL (DRAPES) ×3 IMPLANT
COVER WAND RF STERILE (DRAPES) ×3 IMPLANT
DECANTER SPIKE VIAL GLASS SM (MISCELLANEOUS) ×3 IMPLANT
DERMABOND ADVANCED (GAUZE/BANDAGES/DRESSINGS) ×2
DERMABOND ADVANCED .7 DNX12 (GAUZE/BANDAGES/DRESSINGS) ×1 IMPLANT
DIFFUSER DRILL AIR PNEUMATIC (MISCELLANEOUS) ×3 IMPLANT
DRAIN JACKSON PRATT 10MM FLAT (MISCELLANEOUS) ×2 IMPLANT
DRAPE HALF SHEET 40X57 (DRAPES) IMPLANT
DRAPE LAPAROTOMY 100X72 PEDS (DRAPES) ×3 IMPLANT
DRAPE MICROSCOPE LEICA (MISCELLANEOUS) ×3 IMPLANT
DRILL NEURO 2X3.1 SOFT TOUCH (MISCELLANEOUS) ×3
DRSG OPSITE POSTOP 4X6 (GAUZE/BANDAGES/DRESSINGS) ×2 IMPLANT
DURAPREP 6ML APPLICATOR 50/CS (WOUND CARE) ×3 IMPLANT
ELECT COATED BLADE 2.86 ST (ELECTRODE) ×3 IMPLANT
ELECT REM PT RETURN 9FT ADLT (ELECTROSURGICAL) ×3
ELECTRODE REM PT RTRN 9FT ADLT (ELECTROSURGICAL) ×1 IMPLANT
EVACUATOR SILICONE 100CC (DRAIN) ×2 IMPLANT
GAUZE 4X4 16PLY RFD (DISPOSABLE) IMPLANT
GAUZE SPONGE 4X4 12PLY STRL (GAUZE/BANDAGES/DRESSINGS) IMPLANT
GLOVE BIO SURGEON STRL SZ 6.5 (GLOVE) ×4 IMPLANT
GLOVE BIO SURGEON STRL SZ8 (GLOVE) ×5 IMPLANT
GLOVE BIO SURGEONS STRL SZ 6.5 (GLOVE) ×4
GLOVE BIOGEL PI IND STRL 6.5 (GLOVE) IMPLANT
GLOVE BIOGEL PI IND STRL 8 (GLOVE) ×1 IMPLANT
GLOVE BIOGEL PI IND STRL 8.5 (GLOVE) ×1 IMPLANT
GLOVE BIOGEL PI INDICATOR 6.5 (GLOVE) ×4
GLOVE BIOGEL PI INDICATOR 8 (GLOVE) ×2
GLOVE BIOGEL PI INDICATOR 8.5 (GLOVE) ×2
GLOVE ECLIPSE 8.0 STRL XLNG CF (GLOVE) ×3 IMPLANT
GLOVE SURG SS PI 6.5 STRL IVOR (GLOVE) ×6 IMPLANT
GOWN STRL REUS W/ TWL LRG LVL3 (GOWN DISPOSABLE) IMPLANT
GOWN STRL REUS W/ TWL XL LVL3 (GOWN DISPOSABLE) ×1 IMPLANT
GOWN STRL REUS W/TWL 2XL LVL3 (GOWN DISPOSABLE) ×3 IMPLANT
GOWN STRL REUS W/TWL LRG LVL3 (GOWN DISPOSABLE)
GOWN STRL REUS W/TWL XL LVL3 (GOWN DISPOSABLE) ×3
HALTER HD/CHIN CERV TRACTION D (MISCELLANEOUS) ×3 IMPLANT
HEMOSTAT POWDER KIT SURGIFOAM (HEMOSTASIS) ×3 IMPLANT
HEMOSTAT POWDER SURGIFOAM 1G (HEMOSTASIS) ×2 IMPLANT
KIT BASIN OR (CUSTOM PROCEDURE TRAY) ×3 IMPLANT
KIT TURNOVER KIT B (KITS) ×3 IMPLANT
NDL HYPO 25X1 1.5 SAFETY (NEEDLE) ×1 IMPLANT
NDL SPNL 18GX3.5 QUINCKE PK (NEEDLE) IMPLANT
NDL SPNL 22GX3.5 QUINCKE BK (NEEDLE) ×1 IMPLANT
NEEDLE HYPO 25X1 1.5 SAFETY (NEEDLE) ×3 IMPLANT
NEEDLE SPNL 18GX3.5 QUINCKE PK (NEEDLE) IMPLANT
NEEDLE SPNL 22GX3.5 QUINCKE BK (NEEDLE) ×3 IMPLANT
NS IRRIG 1000ML POUR BTL (IV SOLUTION) ×3 IMPLANT
OIL CARTRIDGE MAESTRO DRILL (MISCELLANEOUS) ×3
PACK LAMINECTOMY NEURO (CUSTOM PROCEDURE TRAY) ×3 IMPLANT
PAD ARMBOARD 7.5X6 YLW CONV (MISCELLANEOUS) ×9 IMPLANT
PEEK SPACER AVS AS 6X14X16 4D (Cage) ×6 IMPLANT
PIN DISTRACTION 14MM (PIN) ×8 IMPLANT
PLATE AVIATOR ASSY 3LVL SZ 51 (Plate) ×2 IMPLANT
RUBBERBAND STERILE (MISCELLANEOUS) ×6 IMPLANT
SCREW AVIATOR VAR SELFTAP 4X14 (Screw) ×16 IMPLANT
SPONGE INTESTINAL PEANUT (DISPOSABLE) ×3 IMPLANT
SPONGE SURGIFOAM ABS GEL 100 (HEMOSTASIS) ×2 IMPLANT
STAPLER SKIN PROX WIDE 3.9 (STAPLE) IMPLANT
SUT VIC AB 3-0 SH 8-18 (SUTURE) ×5 IMPLANT
TOWEL GREEN STERILE (TOWEL DISPOSABLE) ×3 IMPLANT
TOWEL GREEN STERILE FF (TOWEL DISPOSABLE) ×3 IMPLANT
TRAY FOLEY MTR SLVR 16FR STAT (SET/KITS/TRAYS/PACK) IMPLANT
WATER STERILE IRR 1000ML POUR (IV SOLUTION) ×3 IMPLANT

## 2018-09-23 NOTE — Brief Op Note (Signed)
09/23/2018  10:43 AM  PATIENT:  Alfred Jones  67 y.o. male  PRE-OPERATIVE DIAGNOSIS:  Stenosis of cervical spine with myelopathy, cord compression, herniated cervical disc with myelopathy, radiculopathy C 34, C 45, C 56 levels  POST-OPERATIVE DIAGNOSIS:  Stenosis of cervical spine with myelopathy, cord compression, herniated cervical disc with myelopathy, radiculopathy C 34, C 45, C 56 levels  PROCEDURE:  Procedure(s) with comments: Cervical Three-Four Cervical Four-Five Cervical Five-Six Anterior cervical decompression/discectomy/fusion (N/A) - Cervical Three-Four Cervical Four-Five Cervical Five-Six Anterior cervical decompression/discectomy/fusion with PEEK cages, autograft, anterior cervical plate  SURGEON:  Surgeon(s) and Role:    Erline Levine, MD - Primary  PHYSICIAN ASSISTANT:   ASSISTANTS: Poteat, RN   ANESTHESIA:   general  EBL:  25 mL   BLOOD ADMINISTERED:none  DRAINS: (10) Jackson-Pratt drain(s) with closed bulb suction in the prevertebral space   LOCAL MEDICATIONS USED:  MARCAINE    and LIDOCAINE   SPECIMEN:  No Specimen  DISPOSITION OF SPECIMEN:  N/A  COUNTS:  YES  TOURNIQUET:  * No tourniquets in log *  DICTATION: Patient was brought to operating room and following the smooth and uncomplicated induction of general endotracheal anesthesia his head was placed on a horseshoe head holder he was placed in 5 pounds of Holter traction and his anterior neck was prepped and draped in usual sterile fashion. An incision was made on the left side of midline after infiltrating the skin and subcutaneous tissues with local lidocaine. The platysmal layer was incised and subplatysmal dissection was performed exposing the anterior border sternocleidomastoid muscle. Using blunt dissection the carotid sheath was kept lateral and trachea and esophagus kept medial exposing the anterior cervical spine. A bent spinal needle was placed it was felt to be the C4-5  level and this was  confirmed on intraoperative x-ray. Longus coli muscles were taken down from the anterior cervical spine using electrocautery and key elevator and self-retaining retractor was placed. The interspace at C 56 was incised and a thorough discectomy was performed. Distraction pins were placed. Uncinate spurs and central spondylitic ridges were drilled down with a high-speed drill. The spinal cord dura and both C 6 nerve roots were widely decompressed. Hemostasis was assured. After trial sizing a 6 mm peek interbody cage was selected and packed with local autograft. The graft was tamped into position and countersunk appropriately. The retractor was moved and the interspace at C 45 was incised and a thorough discectomy was performed. Distraction pins were placed. Uncinate spurs and central spondylitic ridges were drilled down with a high-speed drill. The spinal cord dura and both C 5 nerve roots were widely decompressed. Hemostasis was assured. After trial sizing a 4mm peek interbody cage was selected and packed with local autograft. The graft was tamped into position and countersunk appropriately.The interspace at C 34 was incised and a thorough discectomy was performed. Distraction pins were placed. Uncinate spurs and central spondylitic ridges were drilled down with a high-speed drill. The spinal cord dura and both C 4 nerve roots were widely decompressed. Hemostasis was assured. After trial sizing a 6 mm peek interbody cage was selected and packed with local autograft. The graft was tamped into position and countersunk appropriately.  Distraction weight was removed. A 51 mm Aviator anterior cervical plate was affixed to the cervical spine with 14 mm variable-angle screws 2 at C 3, 2 at C 4, 2 at C 5,  and 2 at C 6. All screws were well-positioned and locking mechanisms were engaged.  Soft tissues were inspected and found to be in good repair. The wound was irrigated. A final x-ray was obtained with good visualization to  C45 with the interbody grafts and upper screws and plate well visualized. A # 10 JP drain was inserted through a separate stab incision. The platysma layer was closed with 3-0 Vicryl stitches and the skin was reapproximated with 3-0 Vicryl subcuticular stitches. The wound was dressed with Dermabond. Counts were correct at the end of the case. Patient was extubated and taken to recovery in stable and satisfactory condition.    PLAN OF CARE: Admit to inpatient   PATIENT DISPOSITION:  PACU - hemodynamically stable.   Delay start of Pharmacological VTE agent (>24hrs) due to surgical blood loss or risk of bleeding: yes

## 2018-09-23 NOTE — Transfer of Care (Signed)
Immediate Anesthesia Transfer of Care Note  Patient: Alfred Jones  Procedure(s) Performed: Cervical Three-Four Cervical Four-Five Cervical Five-Six Anterior cervical decompression/discectomy/fusion (N/A Spine Cervical)  Patient Location: PACU  Anesthesia Type:General  Level of Consciousness: awake, alert  and oriented  Airway & Oxygen Therapy: Patient Spontanous Breathing and Patient connected to nasal cannula oxygen  Post-op Assessment: Report given to RN and Post -op Vital signs reviewed and stable  Post vital signs: Reviewed and stable  Last Vitals:  Vitals Value Taken Time  BP 129/74 09/23/2018 10:10 AM  Temp    Pulse 81 09/23/2018 10:13 AM  Resp 14 09/23/2018 10:13 AM  SpO2 94 % 09/23/2018 10:13 AM  Vitals shown include unvalidated device data.  Last Pain:  Vitals:   09/23/18 0651  TempSrc:   PainSc: 5          Complications: No apparent anesthesia complications

## 2018-09-23 NOTE — Progress Notes (Signed)
Awake, alert, conversant.  Sore between shoulders.  Full strength bilateral D/B/T/HI.  MAEW.  Numbness in fingers and hands is improved.  Patient is doing well following ACDF.

## 2018-09-23 NOTE — Anesthesia Preprocedure Evaluation (Addendum)
Anesthesia Evaluation  Patient identified by MRN, date of birth, ID band Patient awake    Reviewed: Allergy & Precautions, H&P , NPO status , Patient's Chart, lab work & pertinent test results  Airway Mallampati: II   Neck ROM: full    Dental   Pulmonary sleep apnea , former smoker,    breath sounds clear to auscultation       Cardiovascular negative cardio ROS   Rhythm:regular Rate:Normal     Neuro/Psych PSYCHIATRIC DISORDERS Anxiety Depression  Neuromuscular disease    GI/Hepatic GERD  ,  Endo/Other  obese  Renal/GU    Enlarged prostate    Musculoskeletal  (+) Arthritis ,   Abdominal   Peds  Hematology   Anesthesia Other Findings   Reproductive/Obstetrics                            Anesthesia Physical Anesthesia Plan  ASA: II  Anesthesia Plan: General   Post-op Pain Management:    Induction: Intravenous  PONV Risk Score and Plan: 2 and Ondansetron, Dexamethasone, Midazolam and Treatment may vary due to age or medical condition  Airway Management Planned: Oral ETT  Additional Equipment:   Intra-op Plan:   Post-operative Plan: Extubation in OR  Informed Consent: I have reviewed the patients History and Physical, chart, labs and discussed the procedure including the risks, benefits and alternatives for the proposed anesthesia with the patient or authorized representative who has indicated his/her understanding and acceptance.     Plan Discussed with: CRNA, Anesthesiologist and Surgeon  Anesthesia Plan Comments:         Anesthesia Quick Evaluation

## 2018-09-23 NOTE — Progress Notes (Signed)
Patient provided with CPAP machine for HS.  Nasal mask used as per pt home regiment.  Patient is unsure of home pressure, 8 cmH20 used and patient tolerated well.  Room air.  Patient is familiar with equipment and procedure and is able to remove and replace mask at will.

## 2018-09-23 NOTE — H&P (Signed)
Patient ID:   [id number] Patient: Alfred Jones  Date of Birth: [birth year] Visit Type: Office Visit   Date: 07/30/2018 08:45 AM Provider: Marchia Meiers. Vertell Limber MD   This 67 year old male presents for neck pain.  HISTORY OF PRESENT ILLNESS:  1.  neck pain  Alfred Jones, 67 year old male, self-employed Secondary school teacher and former patient of Dr. Carloyn Manner, visits on recommendation of his primary physician.  Patient denies new symptoms, noting longstanding rheumatoid and psoriatic arthritis have been blamed for numbness and tingling in the both hands and arms and right foot since 2003.  He was seen by Dr. Carloyn Manner 2004-2011 and surgery was discussed but postponed due to insurance issues. He notes occasional balance issues and a stinging pain in his feet. He denies any pain in his neck.  Gabapentin 100 mg 2caps am & noon Gabapentin 400 mg q.h.s.  History:  Skin cancer, rheumatoid arthritis, psoriatic arthritis, sleep apnea with CPAP Surgical history:  Skin cancers 1966, [id number]; tonsillectomy 1998, knee?  2004  Cervical and lumbar MRIs on [facility]          PAST MEDICAL/SURGICAL HISTORY:   (Detailed)    Disease/disorder Onset Date Management Date Comments    Knee surgery  JMP 07/30/2018 -    Tonsillectomy      tumor removed from face  JMP 07/30/2018 -  Skin cancer    JMP 07/30/2018 -     Family History:  (Detailed) Patient reports there is no relevant family history.     Social History:  (Detailed) Tobacco use reviewed. Preferred language is Unknown.   Tobacco use status: Current non-smoker. Smoking status: Never smoker.  SMOKING STATUS Type Smoking Status Usage Per Day Years Used Total Pack Years   Never smoker      TOBACCO/VAPING EXPOSURE No passive vaping exposure. No passive smoke exposure.   HOME ENVIRONMENT/SAFETY The patient is not at risk for falls. The patient has not fallen in the last year.     MEDICATIONS: (added, continued or stopped this visit) Started  Medication Directions Instruction Stopped   alfuzosin ER 10 mg tablet,extended release 24 hr take 1 tablet by oral route  every day     flaxseed oral powder      folic acid 1 mg tablet take 1 tablet by oral route  every day     gabapentin 100 mg capsule 2 caps     gabapentin 400 mg capsule 1cap     methotrexate 2.8 mg ORAL      Otezla 30 mg tablet take 1 tablet by oral route 2 times every day approximately 12 hours apart     Stool Softener 100 mg capsule take 1 capsule by oral route  every day at bedtime as needed       ALLERGIES: Ingredient Reaction Medication Name Comment  NO KNOWN ALLERGIES     No known allergies.   REVIEW OF SYSTEMS   See scanned patient registration form, dated, signed and dated on   Review of Systems Details System Neg/Pos Details  Constitutional Negative Chills, Fatigue, Fever, Malaise, Night sweats, Weight gain and Weight loss.  ENMT Negative Ear drainage, Hearing loss, Nasal drainage, Otalgia, Sinus pressure and Sore throat.  Eyes Negative Eye discharge, Eye pain and Vision changes.  Respiratory Negative Chronic cough, Cough, Dyspnea, Known TB exposure and Wheezing.  Cardio Negative Chest pain, Claudication, Edema and Irregular heartbeat/palpitations.  GI Negative Abdominal pain, Blood in stool, Change in stool pattern, Constipation, Decreased appetite, Diarrhea, Heartburn, Nausea and Vomiting.  GU Negative Dribbling, Dysuria, Erectile dysfunction, Hematuria, Polyuria (Genitourinary), Slow stream, Urinary frequency, Urinary incontinence and Urinary retention.  Endocrine Negative Cold intolerance, Heat intolerance, Polydipsia and Polyphagia.  Neuro Positive Numbness in extremity.  Psych Negative Anxiety, Depression and Insomnia.  Integumentary Negative Brittle hair, Brittle nails, Change in shape/size of mole(s), Hair loss, Hirsutism, Hives, Pruritus, Rash and Skin lesion.  MS Negative Back pain, Joint pain, Joint swelling, Muscle weakness and Neck pain.   Hema/Lymph Negative Easy bleeding, Easy bruising and Lymphadenopathy.  Allergic/Immuno Negative Contact allergy, Environmental allergies, Food allergies and Seasonal allergies.  Reproductive Negative Penile discharge and Sexual dysfunction.   PHYSICAL EXAM:   Vitals Date Temp F BP Pulse Ht In Wt Lb BMI BSA Pain Score  07/30/2018  107/63 91 71 224 31.24  0/10    PHYSICAL EXAM Details General Level of Distress: no acute distress Overall Appearance: normal  Head and Face  Right Left  Fundoscopic Exam:  normal normal    Cardiovascular Cardiac: regular rate and rhythm without murmur  Right Left  Carotid Pulses: normal normal  Respiratory Lungs: clear to auscultation  Neurological Orientation: normal Recent and Remote Memory: normal Attention Span and Concentration:   normal Language: normal Fund of Knowledge: normal  Right Left Sensation: normal normal Upper Extremity Coordination: normal normal  Lower Extremity Coordination: normal normal  Musculoskeletal Gait and Station: normal  Right Left Upper Extremity Muscle Strength: normal normal Lower Extremity Muscle Strength: normal normal Upper Extremity Muscle Tone:  normal normal Lower Extremity Muscle Tone: normal normal   Motor Strength Upper and lower extremity motor strength was tested in the clinically pertinent muscles. Any abnormal findings will be noted below.   Right Left Biceps: 4+/5 4/5 Triceps: 4/5 4-/5 Finger Extensor: 4/5 4-/5   Deep Tendon Reflexes  Right Left Biceps: normal normal Triceps: normal normal Brachioradialis: normal normal Patellar: normal normal Achilles: normal normal  Cranial Nerves II. Optic Nerve/Visual Fields: normal III. Oculomotor: normal IV. Trochlear: normal V. Trigeminal: normal VI. Abducens: normal VII. Facial: normal VIII. Acoustic/Vestibular: normal IX. Glossopharyngeal: normal X. Vagus: normal XI. Spinal Accessory: normal XII.  Hypoglossal: normal  Motor and other Tests Lhermittes: negative Rhomberg: negative Pronator drift: absent     Right Left Spurlings negative negative Hoffman's: normal present Clonus: normal normal Babinski: normal normal   Additional Findings:  Upon examination, 4-/5 right hand intrinsics, 4-/5 left hand intrinsics, full strength BLE, suprapatellar hyperreflexia bilaterally, 3/4 ankle reflexes, numbness worse in right hand.   DIAGNOSTIC RESULTS:   05/22/18 C-spine MRI without contrast 1. Severe central canal stenosis at C4-5 and C5-6 with distortion of the cord and abnormal lateral cord signal abnormality. This may reflect myelomalacia secondary to this stenosis or cord ischemia. 2. Moderate central canal stenosis at C3-4 secondary to a leftward disc osteophyte complex and asymmetric facet hypertrophy. 3. Severe left and moderate right foraminal stenosis at C3-4. 4. Severe right and moderate left foraminal stenosis at C4-5. 5. Severe foraminal narrowing bilaterally at C5-6. 6. Mild foraminal narrowing bilaterally at C6-7 secondary to uncovertebral spurring.  04/29/18 L-spine MRI without contrast Spondylosis appearing worst at L4-5 where there is moderately severe to severe central canal stenosis and bilateral subarticular recess narrowing with impingement on the L5 roots. Moderate right foraminal narrowing is seen at this level. The right facet joint is hypoplastic and there is advanced facet arthropathy on the left. Hypoplastic right facet at L5-S1 with facet arthropathy on the left. Disc and facet degenerative change cause moderate to moderately severe left foraminal  narrowing at this level. Mild central canal and right subarticular recess narrowing L3-4. Mild to moderate central canal and bilateral subarticular recess narrowing at L2-3 where there is a shallow down turning central protrusion.     IMPRESSION:   C-spine MRI reveals spinal stenosis C3-4, C4-5, and C5-6 - most  severe at C5-6. Left worse than right foraminal narrowing at C3-4. Myelomalacia at C4-5 - could contribute to numbness in arms and hands. Myelomalacia and severe stenosis at C5-6 - likely contributing to numbness and tingling. Thickened ligaments at C5-6. Osseous spurring on the left at C3-4. Recommended ACDF C3-4, C4-5, and C5-6 due to central canal stenosis and osseous spurring. Other levels within C-spine are normal.  L-spine MRI without contrast revels multilevel spondylosis, L3-4 mild canal narrowing, Severe canal narrowing at L4-5 - discussed that this is not as severe an issue as his C-spine. Discussed that this can make standing for long periods of times difficult. Recommended injections. Cervical spine needs to be addressed before the lumbar spine.  Upon examination, negative Spurling's bilaterally, 4+/5 right biceps, 4/5 right triceps, 4-/5 right hand intrinsics, 4/5 right finger extensors, 4/5 left biceps, 4-/5 left triceps, 4-/5 left hand intrinsics, 4-/5 left finger extensors, positive Hoffman's on left, full strength BLE, suprapatellar hyperreflexia bilaterally, 3/4 ankle reflexes, numbness worse in right hand.  Discussed surgical intervention - ACDF C3-4, C4-5, C5-6 - counseled regarding risks of surgery. Discussed that patient will be out of work for a couple months for surgery - discussed that symptoms due to myelomalacia may never improve.  PLAN:  1. Nurse education given 2. Soft collar fitted 3. C3-4, C4-5, C5-6 ACDF - recommended patient undergo surgery sooner rather than later due to potential for further damage and his current weakness 4. Follow-up after surgery  Orders: Diagnostic Procedures: Assessment Procedure  M48.02 Cervical Spine- Lateral  Instruction(s)/Education: Assessment Instruction  Z68.31 Lifestyle education regarding diet  Miscellaneous: Assessment   M48.02 Soft Collar Regular ([id number])   Completed Orders (this encounter) Order Details Reason Side  Interpretation Result Initial Treatment Date Region  Lifestyle education regarding diet Patient encouraged to eat a well balance diet         Assessment/Plan   # Detail Type Description   1. Assessment Stenosis of cervical spine with myelopathy (M48.02).   Plan Orders Soft Collar Regular [phone]).       2. Assessment Myelopathy in diseases classified elsewhere (G99.2).       3. Assessment Herniated nucleus pulposus, cervical (M50.20).       4. Assessment Body mass index (BMI) 31.0-31.9, adult (Z68.31).   Plan Orders Today's instructions / counseling include(s) Lifestyle education regarding diet. Clinical information/comments: Patient encouraged to eat a well balance diet.         Pain Management Plan Pain Scale: 0/10. Method: Numeric Pain Intensity Scale. Location: neck. Onset: 07/30/2002. Duration: varies. Quality: discomforting. Pain management follow-up plan of care: Patient taken medication as prescribed.  Fall Risk Plan The patient has not fallen in the last year.              Provider:  Marchia Meiers. Vertell Limber MD  07/30/2018 11:03 AM Dictation edited by: Mirian Mo    CC Providers: Glenna Fellows  [facility] [place], [place] [address]-               Electronically signed by Marchia Meiers Vertell Limber MD on 08/02/2018 05:44 PM

## 2018-09-23 NOTE — Interval H&P Note (Signed)
History and Physical Interval Note:  09/23/2018 7:32 AM  Alfred Jones  has presented today for surgery, with the diagnosis of Stenosis of cervical spine with myelopathy  The various methods of treatment have been discussed with the patient and family. After consideration of risks, benefits and other options for treatment, the patient has consented to  Procedure(s) with comments: Cervical 3-4 Cervical 4-5 Cervical 5-6 Anterior cervical decompression/discectomy/fusion (N/A) - Cervical 3-4 Cervical 4-5 Cervical 5-6 Anterior cervical decompression/discectomy/fusion as a surgical intervention .  The patient's history has been reviewed, patient examined, no change in status, stable for surgery.  I have reviewed the patient's chart and labs.  Questions were answered to the patient's satisfaction.     Peggyann Shoals

## 2018-09-23 NOTE — Evaluation (Signed)
Physical Therapy Evaluation and Discharge Patient Details Name: Alfred Jones MRN: [id number] DOB: 1951/03/01 Today's Date: 09/23/2018   History of Present Illness  Pt is a 67 y.o. male s/p C3-6 ACDF with PMH of sleep apnea, N/T in R leg and B hands, depression, anxiety.   Clinical Impression  Patient evaluated by Physical Therapy with no further acute PT needs identified. Pt amb 400 feet with no AD and supervision, pt has mild drop foot on R which per wife was present PTA. Pt educated on precautions, provided a handout, educated on car transfers and walking program. All education has been completed and the patient has no further questions. No follow-up Physical Therapy or equipment needs. PT is signing off. Thank you for this referral.     Follow Up Recommendations No PT follow up    Equipment Recommendations  None recommended by PT    Recommendations for Other Services       Precautions / Restrictions Precautions Precautions: Cervical;Fall Precaution Booklet Issued: Yes (comment) Required Braces or Orthoses: Cervical Brace Cervical Brace: Soft collar Restrictions Weight Bearing Restrictions: No      Mobility  Bed Mobility Overal bed mobility: Needs Assistance Bed Mobility: Rolling;Supine to Sit Rolling: Supervision   Supine to sit: Supervision     General bed mobility comments: Pt presented sitting EOB. Increased time and VC to perform log roll for sit to supine and supine to sit.   Transfers Overall transfer level: Needs assistance Equipment used: None Transfers: Sit to/from Stand Sit to Stand: Supervision         General transfer comment: Performed maintaining precautions  Ambulation/Gait Ambulation/Gait assistance: Supervision Gait Distance (Feet): 400 Feet Assistive device: None Gait Pattern/deviations: Decreased dorsiflexion - right;Step-through pattern   Gait velocity interpretation: >2.62 ft/sec, indicative of community ambulatory General Gait  Details: Mild hip hike L, kept L knee straight. Needed VC to heel strike, drop foot on R. Took one standing break to discuss car transfers.   Stairs Stairs: Yes Stairs assistance: Supervision Stair Management: One rail Right;Step to pattern Number of Stairs: 3    Wheelchair Mobility    Modified Rankin (Stroke Patients Only)       Balance Overall balance assessment: Needs assistance Sitting-balance support: No upper extremity supported;Feet supported Sitting balance-Leahy Scale: Normal     Standing balance support: During functional activity;No upper extremity supported Standing balance-Leahy Scale: Good                               Pertinent Vitals/Pain Pain Assessment: 0-10 Pain Score: 5  Pain Location: neck, shoulder muscles tight Pain Intervention(s): Monitored during session    Home Living Family/patient expects to be discharged to:: Private residence Living Arrangements: Spouse/significant other Available Help at Discharge: Family Type of Home: House Home Access: Stairs to enter Entrance Stairs-Rails: Right Entrance Stairs-Number of Steps: 2 Home Layout: One level Home Equipment: Shower seat      Prior Function Level of Independence: Independent         Comments: works as Banker        Extremity/Trunk Assessment   Upper Extremity Assessment Upper Extremity Assessment: Overall WFL for tasks assessed;Defer to OT evaluation    Lower Extremity Assessment Lower Extremity Assessment: Overall WFL for tasks assessed;RLE deficits/detail RLE Deficits / Details: drop foot, hx of numbness       Communication   Communication: No difficulties  Cognition Arousal/Alertness:  Awake/alert Behavior During Therapy: WFL for tasks assessed/performed Overall Cognitive Status: Within Functional Limits for tasks assessed                                        General Comments General comments  (skin integrity, edema, etc.): Pt's wife and daughter present intermittently throughout session.    Exercises     Assessment/Plan    PT Assessment Patent does not need any further PT services  PT Problem List         PT Treatment Interventions      PT Goals (Current goals can be found in the Care Plan section)  Acute Rehab PT Goals Patient Stated Goal: return home PT Goal Formulation: With patient Time For Goal Achievement: 10/07/18 Potential to Achieve Goals: Good    Frequency     Barriers to discharge        Co-evaluation               AM-PAC PT "6 Clicks" Mobility  Outcome Measure Help needed turning from your back to your side while in a flat bed without using bedrails?: None Help needed moving from lying on your back to sitting on the side of a flat bed without using bedrails?: None Help needed moving to and from a bed to a chair (including a wheelchair)?: None Help needed standing up from a chair using your arms (e.g., wheelchair or bedside chair)?: None Help needed to walk in hospital room?: None Help needed climbing 3-5 steps with a railing? : A Little 6 Click Score: 23    End of Session Equipment Utilized During Treatment: Gait belt;Cervical collar Activity Tolerance: Patient tolerated treatment well Patient left: in bed;with family/visitor present;with call bell/phone within reach Nurse Communication: Mobility status PT Visit Diagnosis: Other abnormalities of gait and mobility (R26.89);Other symptoms and signs involving the nervous system (R29.898)    Time: 1610-9604 PT Time Calculation (min) (ACUTE ONLY): 25 min   Charges:   PT Evaluation $PT Eval Low Complexity: 1 Low PT Treatments $Gait Training: 8-22 mins       Gilda Crease, SPT Acute Rehab Services [place] 09/23/2018, 2:56 PM

## 2018-09-23 NOTE — Anesthesia Procedure Notes (Signed)
Procedure Name: Intubation Date/Time: 09/23/2018 7:45 AM Performed by: Trinna Post., CRNA Pre-anesthesia Checklist: Patient identified, Emergency Drugs available, Suction available, Patient being monitored and Timeout performed Patient Re-evaluated:Patient Re-evaluated prior to induction Oxygen Delivery Method: Circle system utilized Preoxygenation: Pre-oxygenation with 100% oxygen Induction Type: IV induction Ventilation: Mask ventilation without difficulty Laryngoscope Size: Glidescope and 4 Grade View: Grade I Tube type: Oral Tube size: 7.5 mm Number of attempts: 1 Airway Equipment and Method: Rigid stylet and Video-laryngoscopy Placement Confirmation: ETT inserted through vocal cords under direct vision,  positive ETCO2 and breath sounds checked- equal and bilateral Secured at: 22 cm Tube secured with: Tape Dental Injury: Teeth and Oropharynx as per pre-operative assessment

## 2018-09-23 NOTE — Op Note (Signed)
09/23/2018  10:43 AM  PATIENT:  Alfred Jones  67 y.o. male  PRE-OPERATIVE DIAGNOSIS:  Stenosis of cervical spine with myelopathy, cord compression, herniated cervical disc with myelopathy, radiculopathy C 34, C 45, C 56 levels  POST-OPERATIVE DIAGNOSIS:  Stenosis of cervical spine with myelopathy, cord compression, herniated cervical disc with myelopathy, radiculopathy C 34, C 45, C 56 levels  PROCEDURE:  Procedure(s) with comments: Cervical Three-Four Cervical Four-Five Cervical Five-Six Anterior cervical decompression/discectomy/fusion (N/A) - Cervical Three-Four Cervical Four-Five Cervical Five-Six Anterior cervical decompression/discectomy/fusion with PEEK cages, autograft, anterior cervical plate  SURGEON:  Surgeon(s) and Role:    Erline Levine, MD - Primary  PHYSICIAN ASSISTANT:   ASSISTANTS: Poteat, RN   ANESTHESIA:   general  EBL:  25 mL   BLOOD ADMINISTERED:none  DRAINS: (10) Jackson-Pratt drain(s) with closed bulb suction in the prevertebral space   LOCAL MEDICATIONS USED:  MARCAINE    and LIDOCAINE   SPECIMEN:  No Specimen  DISPOSITION OF SPECIMEN:  N/A  COUNTS:  YES  TOURNIQUET:  * No tourniquets in log *  DICTATION: Patient was brought to operating room and following the smooth and uncomplicated induction of general endotracheal anesthesia his head was placed on a horseshoe head holder he was placed in 5 pounds of Holter traction and his anterior neck was prepped and draped in usual sterile fashion. An incision was made on the left side of midline after infiltrating the skin and subcutaneous tissues with local lidocaine. The platysmal layer was incised and subplatysmal dissection was performed exposing the anterior border sternocleidomastoid muscle. Using blunt dissection the carotid sheath was kept lateral and trachea and esophagus kept medial exposing the anterior cervical spine. A bent spinal needle was placed it was felt to be the C4-5  level and this was  confirmed on intraoperative x-ray. Longus coli muscles were taken down from the anterior cervical spine using electrocautery and key elevator and self-retaining retractor was placed. The interspace at C 56 was incised and a thorough discectomy was performed. Distraction pins were placed. Uncinate spurs and central spondylitic ridges were drilled down with a high-speed drill. The spinal cord dura and both C 6 nerve roots were widely decompressed. Hemostasis was assured. After trial sizing a 6 mm peek interbody cage was selected and packed with local autograft. The graft was tamped into position and countersunk appropriately. The retractor was moved and the interspace at C 45 was incised and a thorough discectomy was performed. Distraction pins were placed. Uncinate spurs and central spondylitic ridges were drilled down with a high-speed drill. The spinal cord dura and both C 5 nerve roots were widely decompressed. Hemostasis was assured. After trial sizing a 1mm peek interbody cage was selected and packed with local autograft. The graft was tamped into position and countersunk appropriately.The interspace at C 34 was incised and a thorough discectomy was performed. Distraction pins were placed. Uncinate spurs and central spondylitic ridges were drilled down with a high-speed drill. The spinal cord dura and both C 4 nerve roots were widely decompressed. Hemostasis was assured. After trial sizing a 6 mm peek interbody cage was selected and packed with local autograft. The graft was tamped into position and countersunk appropriately.  Distraction weight was removed. A 51 mm Aviator anterior cervical plate was affixed to the cervical spine with 14 mm variable-angle screws 2 at C 3, 2 at C 4, 2 at C 5,  and 2 at C 6. All screws were well-positioned and locking mechanisms were engaged.  Soft tissues were inspected and found to be in good repair. The wound was irrigated. A final x-ray was obtained with good visualization to  C45 with the interbody grafts and upper screws and plate well visualized. A # 10 JP drain was inserted through a separate stab incision. The platysma layer was closed with 3-0 Vicryl stitches and the skin was reapproximated with 3-0 Vicryl subcuticular stitches. The wound was dressed with Dermabond. Counts were correct at the end of the case. Patient was extubated and taken to recovery in stable and satisfactory condition.    PLAN OF CARE: Admit to inpatient   PATIENT DISPOSITION:  PACU - hemodynamically stable.   Delay start of Pharmacological VTE agent (>24hrs) due to surgical blood loss or risk of bleeding: yes

## 2018-09-24 ENCOUNTER — Encounter (HOSPITAL_COMMUNITY): Payer: Self-pay | Admitting: Neurosurgery

## 2018-09-24 MED ORDER — OXYCODONE HCL 10 MG PO TABS: 5.0000 mg | ORAL_TABLET | ORAL | 0 refills | Status: DC | PRN

## 2018-09-24 MED ORDER — METHOCARBAMOL 500 MG PO TABS: 500.0000 mg | ORAL_TABLET | Freq: Four times a day (QID) | ORAL | 1 refills | Status: DC | PRN

## 2018-09-24 NOTE — Evaluation (Addendum)
Occupational Therapy Evaluation and Discharge Patient Details Name: Alfred Jones MRN: [id number] DOB: Nov 21, 1950 Today's Date: 09/24/2018    History of Present Illness Pt is a 67 y.o. male s/p C3-6 ACDF with PMH of sleep apnea, N/T in R leg and B hands, depression, anxiety.    Clinical Impression   PTA patient indpendent and working. Currently admitted for above and limited by pain and precautions. Educated on precautions, brace mgmt and wear schedule, ADL compensatory techniques, safety and recommendations.  Patient requires supervision for mobility and transfers, min assist for UB and LB ADLs. Min cueing to adhere to precautions during session. Reviewed techniques/AE, but patient plans to have support of spouse for assist as needed.  Based on performance today, no further needs identified. Thank you for this referral.  OT signing off.     Follow Up Recommendations  No OT follow up;Supervision - Intermittent    Equipment Recommendations  None recommended by OT    Recommendations for Other Services       Precautions / Restrictions Precautions Precautions: Cervical;Fall Precaution Booklet Issued: Yes (comment) Precaution Comments: reviewed with patient and spouse, min cueing to adhere Required Braces or Orthoses: Cervical Brace Cervical Brace: Soft collar Restrictions Weight Bearing Restrictions: No      Mobility Bed Mobility               General bed mobility comments: standing in bathroom upon entry  Transfers Overall transfer level: Needs assistance Equipment used: None Transfers: Sit to/from Stand Sit to Stand: Supervision         General transfer comment: supervision for safety    Balance Overall balance assessment: No apparent balance deficits (not formally assessed)                                         ADL either performed or assessed with clinical judgement   ADL Overall ADL's : Needs assistance/impaired     Grooming:  Supervision/safety;Standing Grooming Details (indicate cue type and reason): cueing for compensatory techniques and precautions Upper Body Bathing: Set up;Sitting   Lower Body Bathing: Minimal assistance;Sit to/from stand Lower Body Bathing Details (indicate cue type and reason): reviewed AE and compensatory techinques, spouse can assist as needed Upper Body Dressing : Minimal assistance;Sitting;With caregiver independent assisting;Cueing for compensatory techniques Upper Body Dressing Details (indicate cue type and reason): reviewed techniques, spouse assisting with UB dressing  Lower Body Dressing: Minimal assistance;Sit to/from stand;Cueing for compensatory techniques Lower Body Dressing Details (indicate cue type and reason): unable to complete figure 4 technique, requires assist from spouse for socks/shoes Toilet Transfer: Supervision/safety;Ambulation       Tub/ Shower Transfer: Supervision/safety;Walk-in shower;Shower seat;Ambulation   Functional mobility during ADLs: Supervision/safety General ADL Comments: supervision for safety, min cueing for precautions and compensatory techniques     Vision Baseline Vision/History: Wears glasses Wears Glasses: At all times Patient Visual Report: No change from baseline Vision Assessment?: No apparent visual deficits     Perception     Praxis      Pertinent Vitals/Pain Pain Assessment: Faces Faces Pain Scale: Hurts little more Pain Location: neck, shoulder muscles tight Pain Intervention(s): Monitored during session     Hand Dominance     Extremity/Trunk Assessment Upper Extremity Assessment Upper Extremity Assessment: Overall WFL for tasks assessed(reports numbness L 1st digit only)   Lower Extremity Assessment Lower Extremity Assessment: Defer to PT evaluation  Cervical / Trunk Assessment Cervical / Trunk Assessment: Other exceptions Cervical / Trunk Exceptions: s/p cervical sx    Communication  Communication Communication: No difficulties   Cognition Arousal/Alertness: Awake/alert Behavior During Therapy: WFL for tasks assessed/performed Overall Cognitive Status: Within Functional Limits for tasks assessed                                     General Comments  spouse present and supportive    Exercises     Shoulder Instructions      Home Living Family/patient expects to be discharged to:: Private residence Living Arrangements: Spouse/significant other Available Help at Discharge: Family Type of Home: House Home Access: Stairs to enter Technical brewer of Steps: 2 Entrance Stairs-Rails: Right Home Layout: One level     Bathroom Shower/Tub: Walk-in shower;Tub only   Bathroom Toilet: Handicapped height     Home Equipment: Shower seat          Prior Functioning/Environment Level of Independence: Independent        Comments: works as Civil engineer, contracting Problem List: Decreased activity tolerance;Decreased knowledge of use of DME or AE;Decreased knowledge of precautions;Impaired sensation;Pain      OT Treatment/Interventions:      OT Goals(Current goals can be found in the care plan section) Acute Rehab OT Goals Patient Stated Goal: return home OT Goal Formulation: With patient/family  OT Frequency:     Barriers to D/C:            Co-evaluation              AM-PAC OT "6 Clicks" Daily Activity     Outcome Measure Help from another person eating meals?: None Help from another person taking care of personal grooming?: None Help from another person toileting, which includes using toliet, bedpan, or urinal?: None Help from another person bathing (including washing, rinsing, drying)?: A Little Help from another person to put on and taking off regular upper body clothing?: A Little Help from another person to put on and taking off regular lower body clothing?: A Little 6 Click Score: 21   End of Session  Equipment Utilized During Treatment: Cervical collar Nurse Communication: Mobility status  Activity Tolerance: Patient tolerated treatment well Patient left: with call bell/phone within reach;with family/visitor present;Other (comment)(seated EOB)  OT Visit Diagnosis: Pain Pain - Right/Left: (bilaterally) Pain - part of body: Shoulder(neck )                Time: 6384-6659 OT Time Calculation (min): 14 min Charges:  OT General Charges $OT Visit: 1 Visit OT Evaluation $OT Eval Low Complexity: 1 Low  Delight Stare, OT Acute Rehabilitation Services Pager [phone] Office [place] 09/24/2018, 9:03 AM

## 2018-09-24 NOTE — Discharge Instructions (Signed)

## 2018-09-24 NOTE — Anesthesia Postprocedure Evaluation (Signed)
Anesthesia Post Note  Patient: TIERRE NETTO  Procedure(s) Performed: Cervical Three-Four Cervical Four-Five Cervical Five-Six Anterior cervical decompression/discectomy/fusion (N/A Spine Cervical)     Patient location during evaluation: PACU Anesthesia Type: General Level of consciousness: awake and alert Pain management: pain level controlled Vital Signs Assessment: post-procedure vital signs reviewed and stable Respiratory status: spontaneous breathing, nonlabored ventilation, respiratory function stable and patient connected to nasal cannula oxygen Cardiovascular status: blood pressure returned to baseline and stable Postop Assessment: no apparent nausea or vomiting Anesthetic complications: no    Last Vitals:  Vitals:   09/24/18 0335 09/24/18 0739  BP: 136/78 125/84  Pulse: 67 67  Resp: 20 18  Temp: 36.6 C 36.7 C  SpO2: 94% 95%    Last Pain:  Vitals:   09/24/18 0805  TempSrc:   PainSc: 0-No pain                 Marbella Markgraf S

## 2018-09-24 NOTE — Discharge Summary (Signed)
Physician Discharge Summary  Patient ID: Alfred Jones MRN: [id number] DOB/AGE: [birth year] 67 y.o.  Admit date: 09/23/2018 Discharge date: 09/24/2018  Admission Diagnoses:Stenosis of cervical spine with myelopathy, cord compression, herniated cervical disc with myelopathy, radiculopathy C 34, C 45, C 56 levels    Discharge Diagnoses: Stenosis of cervical spine with myelopathy, cord compression, herniated cervical disc with myelopathy, radiculopathy C 34, C 45, C 56 levels s/p Cervical Three-Four Cervical Four-Five Cervical Five-Six Anterior cervical decompression/discectomy/fusion (N/A) - Cervical Three-Four Cervical Four-Five Cervical Five-Six Anterior cervical decompression/discectomy/fusion with PEEK cages, autograft, anterior cervical plate     Active Problems:   Herniated cervical disc without myelopathy   Discharged Condition: good  Hospital Course: Alfred Jones was admitted for surgery with dx stenosis, myelopathy, cord compression, and radiculopathy. Following uncomplicated ACDF H7-0, Y6-3, C5-6, he recovered well and transferred to [facility] for nursing care. He is mobilizing well.   Consults: None  Significant Diagnostic Studies: radiology: X-Ray: intra-op  Treatments: surgery: Cervical Three-Four Cervical Four-Five Cervical Five-Six Anterior cervical decompression/discectomy/fusion (N/A) - Cervical Three-Four Cervical Four-Five Cervical Five-Six Anterior cervical decompression/discectomy/fusion with PEEK cages, autograft, anterior cervical plate    Discharge Exam: Blood pressure 136/78, pulse 67, temperature 97.8 F (36.6 C), temperature source Oral, resp. rate 20, height 5\' 11"  (1.803 m), weight 101.2 kg, SpO2 94 %. Alert, conversant. Reports improved (decreased) numbness tingling in hands and feet. Some persistent numbness left index finger. Strength is full all extremities with strong hand intrinsics. Incision without erythema or drainage. Mild soft swelling  left side of neck. Trachea midline. Typical mild dysphagia for 3 level ACD. JP o/p 39ml post-op.    Disposition:  Discharge to home. Pt and wife verbalize understanding of d/c instructions and agree to call if dysphagia increases or increased swelling is noted. He will call office to schedule 3wk office f/u appt. Rx'sOxycodone 10mg  and Robaxin 500mg to chart for prn home use.    Discharge Instructions    Diet - low sodium heart healthy   Complete by:  As directed    Increase activity slowly   Complete by:  As directed      Allergies as of 09/24/2018   No Known Allergies     Medication List    STOP taking these medications   methotrexate 250 MG/10ML injection   naproxen sodium 220 MG tablet Commonly known as:  ALEVE     TAKE these medications   alfuzosin 10 MG 24 hr tablet Commonly known as:  UROXATRAL Take 10 mg by mouth daily with breakfast.   calcium carbonate 750 MG chewable tablet Commonly known as:  TUMS EX Chew 1 tablet by mouth 3 (three) times daily.   citalopram 20 MG tablet Commonly known as:  CELEXA Take 20 mg by mouth at bedtime.   DULoxetine 60 MG capsule Commonly known as:  CYMBALTA Take 1 capsule (60 mg total) by mouth daily.   Fish Oil 1000 MG Caps Take 1 capsule by mouth 2 (two) times daily.   folic acid 1 MG tablet Commonly known as:  FOLVITE Take 1 mg by mouth 2 (two) times daily.   gabapentin 100 MG capsule Commonly known as:  NEURONTIN Take up to three capsules TID What changed:    how much to take  how to take this  when to take this  additional instructions   Melatonin 10 MG Tabs Take 10 mg by mouth at bedtime as needed (sleep).   methocarbamol 500 MG tablet Commonly known as:  ROBAXIN Take 1  tablet (500 mg total) by mouth every 6 (six) hours as needed for muscle spasms.   OTEZLA 30 MG Tabs Generic drug:  Apremilast Take 1 tablet by mouth daily.   Oxycodone HCl 10 MG Tabs Take 0.5-1 tablets (5-10 mg total) by mouth  every 4 (four) hours as needed for severe pain ((score 7 to 10)).   STOOL SOFTENER PO Take 200 mg by mouth 2 (two) times daily.        Signed: Peggyann Shoals, MD 09/24/2018, 8:04 AM

## 2018-09-24 NOTE — Progress Notes (Signed)
Discharge instructions, RX's and follow up appts explained and provided to patient and caregiver verbalized understanding. Patient left floor via wheelchair accompanied by staff.  Azara Gemme, Tivis Ringer, RN

## 2018-09-24 NOTE — Progress Notes (Addendum)
Subjective: Patient reports "I'm just a little sore in my neck and shoulders"  Objective: Vital signs in last 24 hours: Temp:  [97.7 F (36.5 C)-98.3 F (36.8 C)] 97.8 F (36.6 C) (12/04 0335) Pulse Rate:  [67-92] 67 (12/04 0335) Resp:  [11-20] 20 (12/04 0335) BP: (119-154)/(66-97) 136/78 (12/04 0335) SpO2:  [91 %-97 %] 94 % (12/04 0335)  Intake/Output from previous day: 12/03 0701 - 12/04 0700 In: 8185 [P.O.:240; I.V.:700; IV Piggyback:200] Out: 80 [Drains:55; Blood:25] Intake/Output this shift: No intake/output data recorded.  Alert, conversant. Reports improved (decreased) numbness tingling in hands and feet. Some persistent numbness left index finger. Strength is full all extremities with strong hand intrinsics. Incision without erythema or drainage. Mild soft swelling left side of neck. Trachea midline. Typical mild dysphagia for 3 level ACD. JP o/p 84ml post-op.  Lab Results: No results for input(s): WBC, HGB, HCT, PLT in the last 72 hours. BMET No results for input(s): NA, K, CL, CO2, GLUCOSE, BUN, CREATININE, CALCIUM in the last 72 hours.  Studies/Results: Dg Cervical Spine 2-3 Views  Result Date: 09/23/2018 CLINICAL DATA:  ACDF C3-C4, C4-C5, C5-C6. EXAM: CERVICAL SPINE - 2-3 VIEW COMPARISON:  MRI on 05/22/2018 FINDINGS: Image performed at 8:06 shows a surgical instrument at C4-5, for localizing purposes. Subsequent image performed at 9:36, demonstrating anterior fusion involving C3-C5. By history, the fusion extends to involve C6 as well but detail below C5 is limited. IMPRESSION: Anterior cervical fusion. Electronically Signed   By: Nolon Nations M.D.   On: 09/23/2018 10:01    Assessment/Plan: Improving  LOS: 1 day  Ok per Dr. Vertell Limber to d/c to home. Pt and wife verbalize understanding of d/c instructions and agree to call if dysphagia increases or increased swelling is noted. He will call office to schedule 3wk office f/u appt. Rx's Oxycodone 10mg  and Robaxin 500mg  to  chart for prn home use.   Verdis Prime 09/24/2018, 7:29 AM   Patient is doing well.  Discharge home.

## 2018-09-24 NOTE — Plan of Care (Signed)
  Problem: Bowel/Gastric: Goal: Gastrointestinal status for postoperative course will improve Outcome: Completed/Met   Problem: Clinical Measurements: Goal: Ability to maintain clinical measurements within normal limits will improve Outcome: Completed/Met Goal: Postoperative complications will be avoided or minimized Outcome: Completed/Met Goal: Diagnostic test results will improve Outcome: Completed/Met   Problem: Pain Management: Goal: Pain level will decrease Outcome: Completed/Met   Problem: Health Behavior/Discharge Planning: Goal: Identification of resources available to assist in meeting health care needs will improve Outcome: Completed/Met

## 2018-10-20 DIAGNOSIS — M4802 Spinal stenosis, cervical region: Secondary | ICD-10-CM | POA: Diagnosis not present

## 2018-10-29 DIAGNOSIS — M1990 Unspecified osteoarthritis, unspecified site: Secondary | ICD-10-CM | POA: Diagnosis not present

## 2018-10-29 DIAGNOSIS — E669 Obesity, unspecified: Secondary | ICD-10-CM | POA: Diagnosis not present

## 2018-10-29 DIAGNOSIS — L603 Nail dystrophy: Secondary | ICD-10-CM | POA: Diagnosis not present

## 2018-10-29 DIAGNOSIS — L4050 Arthropathic psoriasis, unspecified: Secondary | ICD-10-CM | POA: Diagnosis not present

## 2018-10-29 DIAGNOSIS — D0439 Carcinoma in situ of skin of other parts of face: Secondary | ICD-10-CM | POA: Diagnosis not present

## 2018-10-29 DIAGNOSIS — M150 Primary generalized (osteo)arthritis: Secondary | ICD-10-CM | POA: Diagnosis not present

## 2018-10-29 DIAGNOSIS — M2550 Pain in unspecified joint: Secondary | ICD-10-CM | POA: Diagnosis not present

## 2018-10-29 DIAGNOSIS — Z6830 Body mass index (BMI) 30.0-30.9, adult: Secondary | ICD-10-CM | POA: Diagnosis not present

## 2018-12-01 DIAGNOSIS — G992 Myelopathy in diseases classified elsewhere: Secondary | ICD-10-CM | POA: Diagnosis not present

## 2018-12-01 DIAGNOSIS — M545 Low back pain: Secondary | ICD-10-CM | POA: Diagnosis not present

## 2018-12-01 DIAGNOSIS — M5126 Other intervertebral disc displacement, lumbar region: Secondary | ICD-10-CM | POA: Diagnosis not present

## 2018-12-01 DIAGNOSIS — M5416 Radiculopathy, lumbar region: Secondary | ICD-10-CM | POA: Diagnosis not present

## 2018-12-01 DIAGNOSIS — Z6831 Body mass index (BMI) 31.0-31.9, adult: Secondary | ICD-10-CM | POA: Diagnosis not present

## 2018-12-22 DIAGNOSIS — M5416 Radiculopathy, lumbar region: Secondary | ICD-10-CM | POA: Diagnosis not present

## 2019-01-22 DIAGNOSIS — M545 Low back pain: Secondary | ICD-10-CM | POA: Diagnosis not present

## 2019-02-04 DIAGNOSIS — G894 Chronic pain syndrome: Secondary | ICD-10-CM | POA: Diagnosis not present

## 2019-02-04 DIAGNOSIS — L603 Nail dystrophy: Secondary | ICD-10-CM | POA: Diagnosis not present

## 2019-02-04 DIAGNOSIS — E669 Obesity, unspecified: Secondary | ICD-10-CM | POA: Diagnosis not present

## 2019-02-04 DIAGNOSIS — M2550 Pain in unspecified joint: Secondary | ICD-10-CM | POA: Diagnosis not present

## 2019-02-04 DIAGNOSIS — M5136 Other intervertebral disc degeneration, lumbar region: Secondary | ICD-10-CM | POA: Diagnosis not present

## 2019-02-04 DIAGNOSIS — D0439 Carcinoma in situ of skin of other parts of face: Secondary | ICD-10-CM | POA: Diagnosis not present

## 2019-02-04 DIAGNOSIS — M150 Primary generalized (osteo)arthritis: Secondary | ICD-10-CM | POA: Diagnosis not present

## 2019-02-04 DIAGNOSIS — L4050 Arthropathic psoriasis, unspecified: Secondary | ICD-10-CM | POA: Diagnosis not present

## 2019-02-04 DIAGNOSIS — M1990 Unspecified osteoarthritis, unspecified site: Secondary | ICD-10-CM | POA: Diagnosis not present

## 2019-02-04 DIAGNOSIS — Z6831 Body mass index (BMI) 31.0-31.9, adult: Secondary | ICD-10-CM | POA: Diagnosis not present

## 2019-02-24 DIAGNOSIS — M48061 Spinal stenosis, lumbar region without neurogenic claudication: Secondary | ICD-10-CM | POA: Diagnosis not present

## 2019-02-24 DIAGNOSIS — M47816 Spondylosis without myelopathy or radiculopathy, lumbar region: Secondary | ICD-10-CM | POA: Diagnosis not present

## 2019-03-25 DIAGNOSIS — IMO0001 Reserved for inherently not codable concepts without codable children: Secondary | ICD-10-CM | POA: Insufficient documentation

## 2019-03-25 DIAGNOSIS — M2550 Pain in unspecified joint: Secondary | ICD-10-CM | POA: Diagnosis not present

## 2019-03-25 DIAGNOSIS — Z6833 Body mass index (BMI) 33.0-33.9, adult: Secondary | ICD-10-CM | POA: Diagnosis not present

## 2019-03-25 DIAGNOSIS — M791 Myalgia, unspecified site: Secondary | ICD-10-CM | POA: Diagnosis not present

## 2019-03-25 DIAGNOSIS — W57XXXA Bitten or stung by nonvenomous insect and other nonvenomous arthropods, initial encounter: Secondary | ICD-10-CM | POA: Insufficient documentation

## 2019-03-25 DIAGNOSIS — T148XXA Other injury of unspecified body region, initial encounter: Secondary | ICD-10-CM | POA: Diagnosis not present

## 2019-03-30 DIAGNOSIS — M47816 Spondylosis without myelopathy or radiculopathy, lumbar region: Secondary | ICD-10-CM | POA: Diagnosis not present

## 2019-04-17 ENCOUNTER — Ambulatory Visit (INDEPENDENT_AMBULATORY_CARE_PROVIDER_SITE_OTHER): Payer: Medicare Other | Admitting: Urology

## 2019-04-17 DIAGNOSIS — N401 Enlarged prostate with lower urinary tract symptoms: Secondary | ICD-10-CM | POA: Diagnosis not present

## 2019-04-17 DIAGNOSIS — N5201 Erectile dysfunction due to arterial insufficiency: Secondary | ICD-10-CM

## 2019-04-17 DIAGNOSIS — N5311 Retarded ejaculation: Secondary | ICD-10-CM

## 2019-04-17 DIAGNOSIS — R3912 Poor urinary stream: Secondary | ICD-10-CM | POA: Diagnosis not present

## 2019-04-22 DIAGNOSIS — K219 Gastro-esophageal reflux disease without esophagitis: Secondary | ICD-10-CM | POA: Diagnosis not present

## 2019-04-22 DIAGNOSIS — E782 Mixed hyperlipidemia: Secondary | ICD-10-CM | POA: Diagnosis not present

## 2019-04-22 DIAGNOSIS — R5383 Other fatigue: Secondary | ICD-10-CM | POA: Diagnosis not present

## 2019-04-22 DIAGNOSIS — E559 Vitamin D deficiency, unspecified: Secondary | ICD-10-CM | POA: Diagnosis not present

## 2019-04-27 DIAGNOSIS — M47816 Spondylosis without myelopathy or radiculopathy, lumbar region: Secondary | ICD-10-CM | POA: Diagnosis not present

## 2019-04-28 DIAGNOSIS — R1310 Dysphagia, unspecified: Secondary | ICD-10-CM | POA: Diagnosis not present

## 2019-04-28 DIAGNOSIS — N5089 Other specified disorders of the male genital organs: Secondary | ICD-10-CM | POA: Insufficient documentation

## 2019-04-28 DIAGNOSIS — R600 Localized edema: Secondary | ICD-10-CM | POA: Diagnosis not present

## 2019-04-28 DIAGNOSIS — R0789 Other chest pain: Secondary | ICD-10-CM | POA: Diagnosis not present

## 2019-04-28 DIAGNOSIS — I6529 Occlusion and stenosis of unspecified carotid artery: Secondary | ICD-10-CM | POA: Diagnosis not present

## 2019-04-28 DIAGNOSIS — Z8249 Family history of ischemic heart disease and other diseases of the circulatory system: Secondary | ICD-10-CM | POA: Insufficient documentation

## 2019-04-28 DIAGNOSIS — N5311 Retarded ejaculation: Secondary | ICD-10-CM | POA: Diagnosis not present

## 2019-04-28 DIAGNOSIS — M2550 Pain in unspecified joint: Secondary | ICD-10-CM | POA: Insufficient documentation

## 2019-04-28 DIAGNOSIS — N529 Male erectile dysfunction, unspecified: Secondary | ICD-10-CM | POA: Insufficient documentation

## 2019-04-28 DIAGNOSIS — E559 Vitamin D deficiency, unspecified: Secondary | ICD-10-CM | POA: Insufficient documentation

## 2019-04-28 DIAGNOSIS — N401 Enlarged prostate with lower urinary tract symptoms: Secondary | ICD-10-CM | POA: Diagnosis not present

## 2019-04-28 DIAGNOSIS — L4050 Arthropathic psoriasis, unspecified: Secondary | ICD-10-CM | POA: Insufficient documentation

## 2019-05-04 DIAGNOSIS — R600 Localized edema: Secondary | ICD-10-CM | POA: Diagnosis not present

## 2019-05-04 DIAGNOSIS — I6529 Occlusion and stenosis of unspecified carotid artery: Secondary | ICD-10-CM | POA: Diagnosis not present

## 2019-05-04 DIAGNOSIS — R0789 Other chest pain: Secondary | ICD-10-CM | POA: Diagnosis not present

## 2019-05-04 DIAGNOSIS — R079 Chest pain, unspecified: Secondary | ICD-10-CM | POA: Diagnosis not present

## 2019-05-05 DIAGNOSIS — R600 Localized edema: Secondary | ICD-10-CM | POA: Diagnosis not present

## 2019-05-05 DIAGNOSIS — I2510 Atherosclerotic heart disease of native coronary artery without angina pectoris: Secondary | ICD-10-CM | POA: Diagnosis not present

## 2019-05-05 DIAGNOSIS — R079 Chest pain, unspecified: Secondary | ICD-10-CM | POA: Diagnosis not present

## 2019-05-06 ENCOUNTER — Encounter: Payer: Self-pay | Admitting: *Deleted

## 2019-05-07 ENCOUNTER — Encounter: Payer: Self-pay | Admitting: Internal Medicine

## 2019-05-10 ENCOUNTER — Other Ambulatory Visit: Payer: Self-pay | Admitting: Neurology

## 2019-05-12 DIAGNOSIS — R079 Chest pain, unspecified: Secondary | ICD-10-CM | POA: Diagnosis not present

## 2019-05-12 DIAGNOSIS — Z8249 Family history of ischemic heart disease and other diseases of the circulatory system: Secondary | ICD-10-CM | POA: Diagnosis not present

## 2019-05-12 DIAGNOSIS — R600 Localized edema: Secondary | ICD-10-CM | POA: Diagnosis not present

## 2019-05-12 DIAGNOSIS — R0789 Other chest pain: Secondary | ICD-10-CM | POA: Diagnosis not present

## 2019-05-12 DIAGNOSIS — I6529 Occlusion and stenosis of unspecified carotid artery: Secondary | ICD-10-CM | POA: Diagnosis not present

## 2019-05-20 DIAGNOSIS — Z6832 Body mass index (BMI) 32.0-32.9, adult: Secondary | ICD-10-CM | POA: Diagnosis not present

## 2019-05-20 DIAGNOSIS — M545 Low back pain: Secondary | ICD-10-CM | POA: Diagnosis not present

## 2019-05-20 DIAGNOSIS — M5416 Radiculopathy, lumbar region: Secondary | ICD-10-CM | POA: Diagnosis not present

## 2019-05-20 DIAGNOSIS — M47816 Spondylosis without myelopathy or radiculopathy, lumbar region: Secondary | ICD-10-CM | POA: Diagnosis not present

## 2019-05-20 DIAGNOSIS — M48061 Spinal stenosis, lumbar region without neurogenic claudication: Secondary | ICD-10-CM | POA: Diagnosis not present

## 2019-05-20 DIAGNOSIS — R030 Elevated blood-pressure reading, without diagnosis of hypertension: Secondary | ICD-10-CM | POA: Diagnosis not present

## 2019-05-20 DIAGNOSIS — M5126 Other intervertebral disc displacement, lumbar region: Secondary | ICD-10-CM | POA: Diagnosis not present

## 2019-06-01 DIAGNOSIS — Z1331 Encounter for screening for depression: Secondary | ICD-10-CM | POA: Diagnosis not present

## 2019-06-01 DIAGNOSIS — Z6833 Body mass index (BMI) 33.0-33.9, adult: Secondary | ICD-10-CM | POA: Diagnosis not present

## 2019-06-01 DIAGNOSIS — R0789 Other chest pain: Secondary | ICD-10-CM | POA: Diagnosis not present

## 2019-06-01 DIAGNOSIS — K2970 Gastritis, unspecified, without bleeding: Secondary | ICD-10-CM | POA: Diagnosis not present

## 2019-06-01 DIAGNOSIS — Z1389 Encounter for screening for other disorder: Secondary | ICD-10-CM | POA: Diagnosis not present

## 2019-06-01 DIAGNOSIS — R1310 Dysphagia, unspecified: Secondary | ICD-10-CM | POA: Diagnosis not present

## 2019-06-01 DIAGNOSIS — Z8249 Family history of ischemic heart disease and other diseases of the circulatory system: Secondary | ICD-10-CM | POA: Diagnosis not present

## 2019-06-01 DIAGNOSIS — M2550 Pain in unspecified joint: Secondary | ICD-10-CM | POA: Diagnosis not present

## 2019-06-02 DIAGNOSIS — M47816 Spondylosis without myelopathy or radiculopathy, lumbar region: Secondary | ICD-10-CM | POA: Diagnosis not present

## 2019-06-03 ENCOUNTER — Other Ambulatory Visit (HOSPITAL_BASED_OUTPATIENT_CLINIC_OR_DEPARTMENT_OTHER): Payer: Self-pay

## 2019-06-03 DIAGNOSIS — R5383 Other fatigue: Secondary | ICD-10-CM

## 2019-06-03 DIAGNOSIS — R0683 Snoring: Secondary | ICD-10-CM

## 2019-06-03 DIAGNOSIS — G4710 Hypersomnia, unspecified: Secondary | ICD-10-CM

## 2019-06-03 DIAGNOSIS — G4733 Obstructive sleep apnea (adult) (pediatric): Secondary | ICD-10-CM

## 2019-06-05 ENCOUNTER — Other Ambulatory Visit: Payer: Self-pay

## 2019-06-05 ENCOUNTER — Ambulatory Visit (INDEPENDENT_AMBULATORY_CARE_PROVIDER_SITE_OTHER): Payer: Medicare Other | Admitting: Urology

## 2019-06-05 DIAGNOSIS — N401 Enlarged prostate with lower urinary tract symptoms: Secondary | ICD-10-CM

## 2019-06-05 DIAGNOSIS — R3912 Poor urinary stream: Secondary | ICD-10-CM

## 2019-06-05 DIAGNOSIS — N5201 Erectile dysfunction due to arterial insufficiency: Secondary | ICD-10-CM | POA: Diagnosis not present

## 2019-06-08 ENCOUNTER — Other Ambulatory Visit: Payer: Self-pay

## 2019-06-08 ENCOUNTER — Other Ambulatory Visit (HOSPITAL_COMMUNITY)
Admission: RE | Admit: 2019-06-08 | Discharge: 2019-06-08 | Disposition: A | Discharge status: Home / Self Care | Payer: Medicare Other | Source: Ambulatory Visit | Attending: Neurology | Admitting: Neurology

## 2019-06-08 DIAGNOSIS — Z20828 Contact with and (suspected) exposure to other viral communicable diseases: Secondary | ICD-10-CM | POA: Insufficient documentation

## 2019-06-08 DIAGNOSIS — Z01812 Encounter for preprocedural laboratory examination: Secondary | ICD-10-CM | POA: Insufficient documentation

## 2019-06-08 LAB — SARS CORONAVIRUS 2 (TAT 6-24 HRS): SARS Coronavirus 2: NEGATIVE

## 2019-06-09 DIAGNOSIS — M150 Primary generalized (osteo)arthritis: Secondary | ICD-10-CM | POA: Diagnosis not present

## 2019-06-09 DIAGNOSIS — M2550 Pain in unspecified joint: Secondary | ICD-10-CM | POA: Diagnosis not present

## 2019-06-09 DIAGNOSIS — L4050 Arthropathic psoriasis, unspecified: Secondary | ICD-10-CM | POA: Diagnosis not present

## 2019-06-09 DIAGNOSIS — D0439 Carcinoma in situ of skin of other parts of face: Secondary | ICD-10-CM | POA: Diagnosis not present

## 2019-06-09 DIAGNOSIS — L603 Nail dystrophy: Secondary | ICD-10-CM | POA: Diagnosis not present

## 2019-06-09 DIAGNOSIS — E669 Obesity, unspecified: Secondary | ICD-10-CM | POA: Diagnosis not present

## 2019-06-09 DIAGNOSIS — R5383 Other fatigue: Secondary | ICD-10-CM | POA: Diagnosis not present

## 2019-06-09 DIAGNOSIS — M1990 Unspecified osteoarthritis, unspecified site: Secondary | ICD-10-CM | POA: Diagnosis not present

## 2019-06-09 DIAGNOSIS — Z6831 Body mass index (BMI) 31.0-31.9, adult: Secondary | ICD-10-CM | POA: Diagnosis not present

## 2019-06-09 DIAGNOSIS — G894 Chronic pain syndrome: Secondary | ICD-10-CM | POA: Diagnosis not present

## 2019-06-09 DIAGNOSIS — M47816 Spondylosis without myelopathy or radiculopathy, lumbar region: Secondary | ICD-10-CM | POA: Diagnosis not present

## 2019-06-09 DIAGNOSIS — Z6832 Body mass index (BMI) 32.0-32.9, adult: Secondary | ICD-10-CM | POA: Diagnosis not present

## 2019-06-09 DIAGNOSIS — M5136 Other intervertebral disc degeneration, lumbar region: Secondary | ICD-10-CM | POA: Diagnosis not present

## 2019-06-10 ENCOUNTER — Ambulatory Visit (INDEPENDENT_AMBULATORY_CARE_PROVIDER_SITE_OTHER): Payer: Medicare Other | Admitting: Nurse Practitioner

## 2019-06-10 ENCOUNTER — Ambulatory Visit: Payer: Medicare Other | Attending: Family Medicine | Admitting: Neurology

## 2019-06-10 ENCOUNTER — Encounter: Payer: Self-pay | Admitting: Nurse Practitioner

## 2019-06-10 ENCOUNTER — Other Ambulatory Visit: Payer: Self-pay

## 2019-06-10 VITALS — BP 152/80 | HR 66 | Temp 97.3°F | Ht 70.0 in | Wt 229.2 lb

## 2019-06-10 DIAGNOSIS — K219 Gastro-esophageal reflux disease without esophagitis: Secondary | ICD-10-CM

## 2019-06-10 DIAGNOSIS — Z79899 Other long term (current) drug therapy: Secondary | ICD-10-CM | POA: Insufficient documentation

## 2019-06-10 DIAGNOSIS — G4710 Hypersomnia, unspecified: Secondary | ICD-10-CM | POA: Diagnosis not present

## 2019-06-10 DIAGNOSIS — R0683 Snoring: Secondary | ICD-10-CM

## 2019-06-10 DIAGNOSIS — K5900 Constipation, unspecified: Secondary | ICD-10-CM | POA: Diagnosis not present

## 2019-06-10 DIAGNOSIS — G4733 Obstructive sleep apnea (adult) (pediatric): Secondary | ICD-10-CM | POA: Diagnosis not present

## 2019-06-10 DIAGNOSIS — R5383 Other fatigue: Secondary | ICD-10-CM

## 2019-06-10 DIAGNOSIS — R1319 Other dysphagia: Secondary | ICD-10-CM

## 2019-06-10 NOTE — Progress Notes (Signed)
Primary Care Physician:  Alfred Labrum, MD Primary Gastroenterologist:  Dr. Gala Jones   Chief Complaint  Patient presents with  . Dysphagia    meats gets hung especially chicken  . Gastroesophageal Reflux    usually tries to take something to help  . Constipation    HPI:   Alfred Jones is a 68 y.o. male who presents on referral from primary care for GERD and dysphagia.  Reviewed information provided with referral including office visit dated 04/28/2019.  At that time the patient noted solid food dysphagia which sometimes last 20 to 30 minutes.  Also noted GERD, currently on Protonix daily.  Recommended referral to GI.  Previous endoscopy completed 09/18/2010 also for GERD and dysphasia.  Findings include circumferential esophageal erosions in the distal esophagus unable to assess for Barrett's, narrowing distal esophagus suggesting soft peptic stricture component but no obvious carcinoma.  Noted small hiatal hernia, gastric mucosa normal, duodenum normal.  Esophagus was dilated with 56 French Maloney dilator.  Recommended repeat endoscopy in 3 months to assess for Barrett's.  It does not appear repeat endoscopy occurred.  Colonoscopy up-to-date also 09/18/2010 with no significant findings and recommend repeat exam in 10 years (2021).  Today he states he's doing ok overall. He has chronic reflux, will get a tickle in his throat which causes persistent cough. Has solid food dysphagia about once a month which is severe, attempts to pass with fluids and fluids back up and takes up to 30 mins to pass. Has associated odynophagia. Worst offenders are dry meat, breads. Overall GERD symptoms are controlled well on Protonix daily. Also has constipation, doesn't drink enough water. Has a bowel movement about every 2-4 days, hard stools, straining. Sometimes stools are pasty. No diarrhea. Denies hematochezia, melena. Denies abdominal pain, N/V, fever, chills, unintentional weight loss. Denies URI or  flu-like symptoms. Denies loss of sense of taste or smell. Denies chest pain, dyspnea, dizziness, lightheadedness, syncope, near syncope. Denies any other upper or lower GI symptoms.  Past Medical History:  Diagnosis Date  . Acid reflux   . Anxiety   . Arthritis    RHEUMATOID OR PSORIATIC ?  Marland Kitchen BCC (basal cell carcinoma of skin) 06/19/2007   left lower chest (CX35FU)  . BCC (basal cell carcinoma of skin) infiltrated 03/11/2018   right nose (MOHS)  . BCC (basal cell carcinoma of skin) ulcerated 07/11/2010   left cheek   . Cancer ([place])   . Depression   . Enlarged prostate   . Hyperlipidemia   . Numbness   . Sleep apnea    TESTED BACK IN 2017  . Superficial basal cell carcinoma (BCC) 03/11/2018   left shoulder     Past Surgical History:  Procedure Laterality Date  . ANTERIOR CERVICAL DECOMP/DISCECTOMY FUSION N/A 09/23/2018   Procedure: Cervical Three-Four Cervical Four-Five Cervical Five-Six Anterior cervical decompression/discectomy/fusion;  Surgeon: Erline Levine, MD;  Location: [facility];  Service: Neurosurgery;  Laterality: N/A;  Cervical Three-Four Cervical Four-Five Cervical Five-Six Anterior cervical decompression/discectomy/fusion  . CARDIAC CATHETERIZATION     20 YRS AGO AT [facility]  . EYE SURGERY     METAL REMOVED FROM LEFT EYE  (IN OFFICE)  . TONSILLECTOMY     as a child    Current Outpatient Medications  Medication Sig Dispense Refill  . acetaminophen (TYLENOL) 500 MG tablet Take 500 mg by mouth every 6 (six) hours as needed.    Marland Kitchen alfuzosin (UROXATRAL) 10 MG 24 hr tablet Take 10 mg by  mouth daily with breakfast.    . Apremilast (OTEZLA) 30 MG TABS Take 1 tablet by mouth daily.     . calcium carbonate (TUMS EX) 750 MG chewable tablet Chew 1 tablet by mouth as needed.     . Coenzyme Q10 (CO Q 10 PO) Take 1 tablet by mouth daily.    Alfred Jones Calcium (STOOL SOFTENER PO) Take 200 mg by mouth as needed.     . DULoxetine (CYMBALTA) 60 MG capsule Take 1 capsule (60 mg total)  by mouth daily. Please call for an appt or request PCP to manage future refills. 30 capsule 1  . folic acid (FOLVITE) 1 MG tablet Take 1 mg by mouth 2 (two) times daily.    Marland Kitchen gabapentin (NEURONTIN) 100 MG capsule Take up to three capsules TID (Patient taking differently: 100mg  PRN and 400mg  at bedtime) 90 capsule 11  . Melatonin 10 MG TABS Take 10 mg by mouth at bedtime as needed (sleep).     . methocarbamol (ROBAXIN) 500 MG tablet Take 1 tablet (500 mg total) by mouth every 6 (six) hours as needed for muscle spasms. 60 tablet 1  . oxyCODONE 10 MG TABS Take 0.5-1 tablets (5-10 mg total) by mouth every 4 (four) hours as needed for severe pain ((score 7 to 10)). 60 tablet 0  . pantoprazole (PROTONIX) 40 MG tablet Take 40 mg by mouth daily.    . rosuvastatin (CRESTOR) 5 MG tablet Take 5 mg by mouth once a week.    . Vitamin D, Ergocalciferol, (DRISDOL) 1.25 MG (50000 UT) CAPS capsule Take 50,000 Units by mouth every 7 (seven) days.     No current facility-administered medications for this visit.     Allergies as of 06/10/2019  . (No Known Allergies)    Family History  Problem Relation Age of Onset  . Heart disease Mother   . Diabetes Mother   . Heart attack Mother   . Heart disease Father   . Diabetes Father   . Colon cancer Neg Hx     Social History   Socioeconomic History  . Marital status: Married    Spouse name: Not on file  . Number of children: 2  . Years of education: 69  . Highest education level: Not on file  Occupational History  . Occupation: Manufacturing systems engineer  . Financial resource strain: Not on file  . Food insecurity    Worry: Not on file    Inability: Not on file  . Transportation needs    Medical: Not on file    Non-medical: Not on file  Tobacco Use  . Smoking status: Former Smoker    Packs/day: 1.00    Years: 16.00    Pack years: 16.00    Types: Cigarettes    Quit date: 10/22/1986    Years since quitting: 32.6  . Smokeless tobacco: Never Used   Substance and Sexual Activity  . Alcohol use: No  . Drug use: No  . Sexual activity: Not on file  Lifestyle  . Physical activity    Days per week: Not on file    Minutes per session: Not on file  . Stress: Not on file  Relationships  . Social Herbalist on phone: Not on file    Gets together: Not on file    Attends religious service: Not on file    Active member of club or organization: Not on file    Attends meetings of clubs or  organizations: Not on file    Relationship status: Not on file  . Intimate partner violence    Fear of current or ex partner: Not on file    Emotionally abused: Not on file    Physically abused: Not on file    Forced sexual activity: Not on file  Other Topics Concern  . Not on file  Social History Narrative   Lives at home with his wife and son.   Right-handed.   2-4 cups caffeine per day.    Review of Systems: General: Negative for anorexia, weight loss, fever, chills, fatigue, weakness. ENT: Negative for hoarseness, difficulty swallowing. CV: Negative for chest pain, angina, palpitations, peripheral edema.  Respiratory: Negative for dyspnea at rest, cough, sputum, wheezing.  GI: See history of present illness. MS: Negative for joint pain, low back pain.  Derm: Negative for rash or itching.  Endo: Negative for unusual weight change.  Heme: Negative for bruising or bleeding. Allergy: Negative for rash or hives.    Physical Exam: BP (!) 152/80   Pulse 66   Temp (!) 97.3 F (36.3 C) (Oral)   Ht 5\' 10"  (1.778 m)   Wt 229 lb 3.2 oz (104 kg)   BMI 32.89 kg/m  General:   Alert and oriented. Pleasant and cooperative. Well-nourished and well-developed.  Head:  Normocephalic and atraumatic. Eyes:  Without icterus, sclera clear and conjunctiva pink.  Ears:  Normal auditory acuity. Cardiovascular:  S1, S2 present without murmurs appreciated. Extremities without clubbing or edema. Respiratory:  Clear to auscultation bilaterally. No  wheezes, rales, or rhonchi. No distress.  Gastrointestinal:  +BS, soft, non-tender and non-distended. No HSM noted. No guarding or rebound. No masses appreciated.  Rectal:  Deferred  Musculoskalatal:  Symmetrical without gross deformities.  Neurologic:  Alert and oriented x4;  grossly normal neurologically. Psych:  Alert and cooperative. Normal mood and affect. Heme/Lymph/Immune: No excessive bruising noted.    06/10/2019 9:12 AM   Disclaimer: This note was dictated with voice recognition software. Similar sounding words can inadvertently be transcribed and may not be corrected upon review.

## 2019-06-10 NOTE — Assessment & Plan Note (Signed)
Having intermittent but significant solid food dysphagia.  Only occurs about once a month but when it happens it would typically be stuck for a prolonged amount of time, up to 30 minutes.  He cannot pass any liquids when he is having an episode.  Associated with odynophagia.  Previous EGD per HPI found distal esophageal erosions.  He did not follow through with surveillance EGD to evaluate for Barrett's esophagus.  At this point we will proceed with an EGD with dilation to help evaluate and treat his symptoms.  Proceed with EGD +/- dilation propofol/MAC with Dr. Gala Romney in near future: the risks, benefits, and alternatives have been discussed with the patient in detail. The patient states understanding and desires to proceed.  The patient is currently on Cymbalta, Neurontin, Biaxin, oxycodone.  No other anticoagulants, anxiolytics, chronic pain medications, antidepressants, diabetes medications, or iron supplements.  We will plan for the procedure on propofol/MAC to promote adequate sedation.

## 2019-06-10 NOTE — Assessment & Plan Note (Signed)
History of chronic GERD currently well controlled on Protonix 40 mg daily.  Recommend he continue his current Protonix regimen.  Follow-up in 4 months.

## 2019-06-10 NOTE — Patient Instructions (Addendum)
Your health issues we discussed today were:   Dysphagia (swallowing difficulties): 1. Continue taking your Protonix 2. We will schedule an upper endoscopy with possible dilation to evaluate your swallowing difficulties 3. Further printed information below related to foods and prevention of swallowing difficulties 4. Call us if you have any worsening or severe symptoms 5. If you have an episode where something gets stuck and it lasts for more than 30 to 60 minutes, proceed to the ER  GERD (reflux/heartburn): 1. Continue taking Protonix 2. Let us know if you have any worsening or severe symptoms  Constipation: 1. As we discussed, and adequate water intake is probably causing a large portion of your constipation 2. Continue taking your stool softener daily 3. You can consider adding MiraLAX once a day to help you have better bowel movements.  This also will help you drink more water 4. Call us if you have any worsening or severe symptoms  Overall I recommend:  1. Continue your other current medications 2. Return for follow-up in 4 months 3. Call us if you have any questions or concerns.   Because of recent events of COVID-19 ("Coronavirus"), follow CDC recommendations:  Wash your hand frequently Avoid touching your face Stay away from people who are sick If you have symptoms such as fever, cough, shortness of breath then call your healthcare provider for further guidance If you are sick, STAY AT HOME unless otherwise directed by your healthcare provider. Follow directions from state and national officials regarding staying safe   At [facility] we value your feedback. You may receive a survey about your visit today. Please share your experience as we strive to create trusting relationships with our patients to provide genuine, compassionate, quality care.  We appreciate your understanding and patience as we review any laboratory studies, imaging, and other diagnostic  tests that are ordered as we care for you. Our office policy is 5 business days for review of these results, and any emergent or urgent results are addressed in a timely manner for your best interest. If you do not hear from our office in 1 week, please contact us.   We also encourage the use of MyChart, which contains your medical information for your review as well. If you are not enrolled in this feature, an access code is on this after visit summary for your convenience. Thank you for allowing [place] to be involved in your care.  It was great to see you today!  I hope you have a great day!!       Dysphagia Eating Plan, Bite Size Food This diet plan is for people with moderate swallowing problems who have transitioned from pureed and minced foods. Bite size foods are soft and cut into small chunks so that they can be swallowed safely. On this eating plan, you may be instructed to drink liquids that are thickened. Work with your health care provider and your diet and nutrition specialist (dietitian) to make sure that you are following the diet safely and getting all the nutrients you need. What are tips for following this plan? General guidelines for foods   You may eat foods that are tender, soft, and moist.  Always test food texture before taking a bite. Poke food with a fork or spoon to make sure it is tender.  Food should be easy to cut and shew. Avoid large pieces of food that require a lot of chewing.  Take small bites. Each bite should be smaller than your  thumb nail (about 2mm by 15 mm).  If you were on pureed and minced food diet plans, you may eat any of the foods included in those diets.  Avoid foods that are very dry, hard, sticky, chewy, coarse, or crunchy.  If instructed by your health care provider, thicken liquids. Follow your health care provider's instructions for what products to use, how to do this, and to what thickness. ? Your health care provider may recommend  using a commercial thickener, rice cereal, or potato flakes. Ask your health care provider to recommend thickeners. ? Thickened liquids are usually a "pudding-like" consistency, or they may be as thick as honey or thick enough to eat with a spoon. Cooking  To moisten foods, you may add liquids while you are blending, mashing, or grinding your foods to the right consistency. These liquids include gravies, sauces, vegetable or fruit juice, milk, half and half, or water.  Strain extra liquid from foods before eating.  Reheat foods slowly to prevent a tough crust from forming.  Prepare foods in advance. Meal planning  Eat a variety of foods to get all the nutrients you need.  Some foods may be tolerated better than others. Work with your dietitian to identify which foods are safest for you to eat.  Follow your meal plan as told by your dietitian. What foods are allowed? Grains Moist breads without nuts or seeds. Biscuits, muffins, pancakes, and waffles that are well-moistened with syrup, jelly, margarine, or butter. Cooked cereals. Moist bread stuffing. Moist rice. Well-moistened cold cereal with small chunks. Well-cooked pasta, noodles, rice, and bread dressing in small pieces and thick sauce. Soft dumplings or spaetzle in small pieces and butter or gravy. Vegetables Soft, well-cooked vegetables in small pieces. Soft-cooked, mashed potatoes. Thickened vegetable juice. Fruits Canned or cooked fruits that are soft or moist and do not have skin or seeds. Fresh, soft bananas. Thickened fruit juices. Meat and other protein foods Tender, moist meats or poultry in small pieces. Moist meatballs or meatloaf. Fish without bones. Eggs or egg substitutes in small pieces. Tofu. Tempeh and meat alternatives in small pieces. Well-cooked, tender beans, peas, baked beans, and other legumes. Dairy Thickened milk. Cream cheese. Yogurt. Cottage cheese. Sour cream. Small pieces of soft cheese. Fats and oils  Butter. Oils. Margarine. Mayonnaise. Gravy. Spreads. Sweets and desserts Soft, smooth, moist desserts. Pudding. Custard. Moist cakes. Jam. Jelly. Honey. Preserves. Ask your health care provider whether you can have frozen desserts. Seasoning and other foods All seasonings and sweeteners. All sauces with small chunks. Prepared tuna, egg, or chicken salad without raw fruits or vegetables. Moist casseroles with small, tender pieces of meat. Soups with tender meat. What foods are not allowed? Grains Coarse or dry cereals. Dry breads. Toast. Crackers. Tough, crusty breads, such as [place] bread and baguettes. Dry pancakes, waffles, and muffins. Sticky rice. Dry bread stuffing. Granola. Popcorn. Chips. Vegetables All raw vegetables. Cooked corn. Rubbery or stiff cooked vegetables. Stringy vegetables, such as celery. Tough, crisp fried potatoes. Potato skins. Fruits Hard, crunchy, stringy, high-pulp, and juicy raw fruits such as apples, pineapple, papaya, and watermelon. Small, round fruits, such as grapes. Dried fruit and fruit leather. Meat and other protein foods Large pieces of meat. Dry, tough meats, such as bacon, sausage, and hot dogs. Chicken, [place], or fish with skin and bones. Crunchy peanut butter. Nuts. Seeds. Nut and seed butters. Dairy Yogurt with nuts, seeds, or large chunks. Large chunks of cheese. Frozen desserts and milk consistency not allowed by your  dietitian. Sweets and desserts Dry cakes. Chewy or dry cookies. Any desserts with nuts, seeds, dry fruits, coconut, pineapple, or anything dry, sticky, or hard. Chewy caramel. Licorice. Taffy-type candies. Ask your health care provider whether you can have frozen desserts. Seasoning and other foods Soups with tough or large chunks of meats, poultry, or vegetables. Corn or clam chowder. Smoothies with large chunks of fruit. Summary  Bite size foods can be helpful for people with moderate swallowing problems.  On the dysphagia eating  plan, you may eat foods that are soft, moist, and cut into pieces smaller than 25mm by 62mm.  You may be instructed to thicken liquids. Follow your health care provider's instructions about how to do this and to what consistency. This information is not intended to replace advice given to you by your health care provider. Make sure you discuss any questions you have with your health care provider. Document Released: 10/08/2005 Document Revised: 01/29/2019 Document Reviewed: 01/18/2017 Elsevier Patient Education  2020 [employer].

## 2019-06-10 NOTE — Assessment & Plan Note (Signed)
Complaints of constipation with a bowel movement every 2 to 3 days.  Stools are hard and require straining.  He is pretty convinced that this is because he does not drink much water.  Recommend increase water intake to stay hydrated.  Continue stool softener.  Can add MiraLAX once a day to help have regular bowel movements.  Follow-up in 4 months.

## 2019-06-11 NOTE — Procedures (Signed)
[place] A. Merlene Laughter, MD     [email or web address]             NOCTURNAL POLYSOMNOGRAPHY TITRATION STUDY  LOCATION: [facility]  Patient Name: Alfred Jones, Alfred Jones Date: 06/10/2019 Gender: Male D.O.B: [birth year] Age (years): 38 Referring Provider: Judd Lien MD Height (inches): 69 Interpreting Physician: Phillips Odor MD, ABSM Weight (lbs): 229 RPSGT: Peak, Jusitn BMI: 34 MRN: [id number] Neck Size: 16.50 CLINICAL INFORMATION The patient is referred for a CPAP titration to treat sleep apnea.     Date of NPSG, Split Night or HST:  SLEEP STUDY TECHNIQUE As per the AASM Manual for the Scoring of Sleep and Associated Events v2.3 (April 2016) with a hypopnea requiring 4% desaturations.  The channels recorded and monitored were frontal, central and occipital EEG, electrooculogram (EOG), submentalis EMG (chin), nasal and oral airflow, thoracic and abdominal wall motion, anterior tibialis EMG, snore microphone, electrocardiogram, and pulse oximetry. Continuous positive airway pressure (CPAP) was initiated at the beginning of the study and titrated to treat sleep-disordered breathing.  MEDICATIONS Medications self-administered by patient taken the night of the study : N/A  Current Outpatient Medications:  .  acetaminophen (TYLENOL) 500 MG tablet, Take 500 mg by mouth every 6 (six) hours as needed., Disp: , Rfl:  .  alfuzosin (UROXATRAL) 10 MG 24 hr tablet, Take 10 mg by mouth daily with breakfast., Disp: , Rfl:  .  Apremilast (OTEZLA) 30 MG TABS, Take 1 tablet by mouth daily. , Disp: , Rfl:  .  calcium carbonate (TUMS EX) 750 MG chewable tablet, Chew 1 tablet by mouth as needed. , Disp: , Rfl:  .  Coenzyme Q10 (CO Q 10 PO), Take 1 tablet by mouth daily., Disp: , Rfl:  .  Docusate Calcium (STOOL SOFTENER PO), Take 200 mg by mouth as needed. , Disp: , Rfl:  .  DULoxetine (CYMBALTA) 60 MG capsule, Take 1 capsule (60 mg total) by mouth daily. Please call for an  appt or request PCP to manage future refills., Disp: 30 capsule, Rfl: 1 .  folic acid (FOLVITE) 1 MG tablet, Take 1 mg by mouth 2 (two) times daily., Disp: , Rfl:  .  gabapentin (NEURONTIN) 100 MG capsule, Take up to three capsules TID (Patient taking differently: 100mg  PRN and 400mg  at bedtime), Disp: 90 capsule, Rfl: 11 .  Melatonin 10 MG TABS, Take 10 mg by mouth at bedtime as needed (sleep). , Disp: , Rfl:  .  methocarbamol (ROBAXIN) 500 MG tablet, Take 1 tablet (500 mg total) by mouth every 6 (six) hours as needed for muscle spasms., Disp: 60 tablet, Rfl: 1 .  oxyCODONE 10 MG TABS, Take 0.5-1 tablets (5-10 mg total) by mouth every 4 (four) hours as needed for severe pain ((score 7 to 10))., Disp: 60 tablet, Rfl: 0 .  pantoprazole (PROTONIX) 40 MG tablet, Take 40 mg by mouth daily., Disp: , Rfl:  .  rosuvastatin (CRESTOR) 5 MG tablet, Take 5 mg by mouth once a week., Disp: , Rfl:  .  Vitamin D, Ergocalciferol, (DRISDOL) 1.25 MG (50000 UT) CAPS capsule, Take 50,000 Units by mouth every 7 (seven) days., Disp: , Rfl:    TECHNICIAN COMMENTS Comments added by technician: PATIENT COMES TO SLEEP CENTER FOR CPAP TITRATION. PATIENT WAS ADHERENT TO MASK AND PRESSURE. PATIENT IS CURRENTLY USING A NASAL MASK AT HOME WITH AUTO CPAP 5-8 CMH2O. PATIENT HAD A DESIRE TO TRY A FULL FACE MASK AND USED WITHOUT LEAK OR DISCOMFORT DURING THE ENTIRE STUDY TIME.  ALL STAGES OF SLEEP WERE OBSERVED. SUPINE AND LATERAL POSITIONS WERE OBSERVED. AIRWAY OBSTRUCTIONS AND SNORING WERE ELIMINATED WITH CPAP PRESSURE. NO LEG MOVEMENTS WERE NOTED. ECG SHOWED NORMAL SINUS RHYTHM. Comments added by scorer: N/A RESPIRATORY PARAMETERS Optimal PAP Pressure (cm): 12 AHI at Optimal Pressure (/hr): 0.7 Overall Minimal O2 (%): 86.0 Supine % at Optimal Pressure (%): 0 Minimal O2 at Optimal Pressure (%): 91.0   SLEEP ARCHITECTURE The study was initiated at 10:45:29 PM and ended at 4:53:13 AM.  Sleep onset time was 22.2 minutes and the  sleep efficiency was 88.2%%. The total sleep time was 324.5 minutes.  The patient spent 4.6%% of the night in stage N1 sleep, 82.0%% in stage N2 sleep, 1.1%% in stage N3 and 12.3% in REM.Stage REM latency was 72.0 minutes  Wake after sleep onset was 21.0. Alpha intrusion was absent. Supine sleep was 34.36%.  CARDIAC DATA The 2 lead EKG demonstrated sinus rhythm. The mean heart rate was 65.3 beats per minute. Other EKG findings include: None. LEG MOVEMENT DATA The total Periodic Limb Movements of Sleep (PLMS) were 0. The PLMS index was 0.0. A PLMS index of <15 is considered normal in adults.  IMPRESSIONS 1.  This is a successful CPAP titration recording.  The optimal pressure is 12 cm of water.   Delano Metz, MD Diplomate, American Board of Sleep Medicine.  ELECTRONICALLY SIGNED ON:  06/11/2019, 5:06 PM [place] PH: [phone]   FX: [phone] [place]

## 2019-07-02 DIAGNOSIS — Z6833 Body mass index (BMI) 33.0-33.9, adult: Secondary | ICD-10-CM | POA: Diagnosis not present

## 2019-07-02 DIAGNOSIS — D179 Benign lipomatous neoplasm, unspecified: Secondary | ICD-10-CM | POA: Diagnosis not present

## 2019-07-08 DIAGNOSIS — M5126 Other intervertebral disc displacement, lumbar region: Secondary | ICD-10-CM | POA: Diagnosis not present

## 2019-07-08 DIAGNOSIS — M48061 Spinal stenosis, lumbar region without neurogenic claudication: Secondary | ICD-10-CM | POA: Diagnosis not present

## 2019-07-08 DIAGNOSIS — Z6832 Body mass index (BMI) 32.0-32.9, adult: Secondary | ICD-10-CM | POA: Diagnosis not present

## 2019-07-08 DIAGNOSIS — M545 Low back pain: Secondary | ICD-10-CM | POA: Diagnosis not present

## 2019-07-08 DIAGNOSIS — M5416 Radiculopathy, lumbar region: Secondary | ICD-10-CM | POA: Diagnosis not present

## 2019-07-10 ENCOUNTER — Other Ambulatory Visit: Payer: Self-pay | Admitting: Neurology

## 2019-07-14 ENCOUNTER — Other Ambulatory Visit: Payer: Self-pay | Admitting: Neurology

## 2019-07-16 ENCOUNTER — Other Ambulatory Visit: Payer: Self-pay | Admitting: Neurology

## 2019-07-22 DIAGNOSIS — M48061 Spinal stenosis, lumbar region without neurogenic claudication: Secondary | ICD-10-CM | POA: Diagnosis not present

## 2019-07-22 DIAGNOSIS — M47816 Spondylosis without myelopathy or radiculopathy, lumbar region: Secondary | ICD-10-CM | POA: Diagnosis not present

## 2019-07-22 DIAGNOSIS — Z6833 Body mass index (BMI) 33.0-33.9, adult: Secondary | ICD-10-CM | POA: Diagnosis not present

## 2019-07-27 DIAGNOSIS — Z23 Encounter for immunization: Secondary | ICD-10-CM | POA: Diagnosis not present

## 2019-07-27 DIAGNOSIS — Z6834 Body mass index (BMI) 34.0-34.9, adult: Secondary | ICD-10-CM | POA: Diagnosis not present

## 2019-07-27 DIAGNOSIS — E782 Mixed hyperlipidemia: Secondary | ICD-10-CM | POA: Diagnosis not present

## 2019-07-27 DIAGNOSIS — M2550 Pain in unspecified joint: Secondary | ICD-10-CM | POA: Diagnosis not present

## 2019-07-27 DIAGNOSIS — D179 Benign lipomatous neoplasm, unspecified: Secondary | ICD-10-CM | POA: Diagnosis not present

## 2019-07-27 DIAGNOSIS — M4712 Other spondylosis with myelopathy, cervical region: Secondary | ICD-10-CM | POA: Diagnosis not present

## 2019-07-27 DIAGNOSIS — L4050 Arthropathic psoriasis, unspecified: Secondary | ICD-10-CM | POA: Diagnosis not present

## 2019-07-27 DIAGNOSIS — S29012A Strain of muscle and tendon of back wall of thorax, initial encounter: Secondary | ICD-10-CM | POA: Diagnosis not present

## 2019-08-11 ENCOUNTER — Encounter (HOSPITAL_COMMUNITY)
Admission: RE | Admit: 2019-08-11 | Discharge: 2019-08-11 | Disposition: A | Discharge status: Home / Self Care | Payer: Medicare Other | Source: Ambulatory Visit | Attending: Internal Medicine | Admitting: Internal Medicine

## 2019-08-11 ENCOUNTER — Other Ambulatory Visit (HOSPITAL_COMMUNITY)
Admission: RE | Admit: 2019-08-11 | Discharge: 2019-08-11 | Disposition: A | Discharge status: Home / Self Care | Payer: Medicare Other | Source: Ambulatory Visit | Attending: Internal Medicine | Admitting: Internal Medicine

## 2019-08-11 ENCOUNTER — Other Ambulatory Visit: Payer: Self-pay

## 2019-08-11 DIAGNOSIS — Z01812 Encounter for preprocedural laboratory examination: Secondary | ICD-10-CM | POA: Diagnosis not present

## 2019-08-11 DIAGNOSIS — Z20828 Contact with and (suspected) exposure to other viral communicable diseases: Secondary | ICD-10-CM | POA: Diagnosis not present

## 2019-08-11 LAB — SARS CORONAVIRUS 2 (TAT 6-24 HRS): SARS Coronavirus 2: NEGATIVE

## 2019-08-13 ENCOUNTER — Ambulatory Visit (HOSPITAL_COMMUNITY): Payer: Medicare Other | Admitting: Anesthesiology

## 2019-08-13 ENCOUNTER — Other Ambulatory Visit: Payer: Self-pay

## 2019-08-13 ENCOUNTER — Encounter (HOSPITAL_COMMUNITY)
Admission: RE | Disposition: A | Discharge status: Home / Self Care | Payer: Self-pay | Source: Home / Self Care | Attending: Internal Medicine

## 2019-08-13 ENCOUNTER — Ambulatory Visit (HOSPITAL_COMMUNITY)
Admission: RE | Admit: 2019-08-13 | Discharge: 2019-08-13 | Disposition: A | Discharge status: Home / Self Care | Payer: Medicare Other | Attending: Internal Medicine | Admitting: Internal Medicine

## 2019-08-13 DIAGNOSIS — Z85828 Personal history of other malignant neoplasm of skin: Secondary | ICD-10-CM | POA: Diagnosis not present

## 2019-08-13 DIAGNOSIS — F419 Anxiety disorder, unspecified: Secondary | ICD-10-CM | POA: Insufficient documentation

## 2019-08-13 DIAGNOSIS — K219 Gastro-esophageal reflux disease without esophagitis: Secondary | ICD-10-CM | POA: Insufficient documentation

## 2019-08-13 DIAGNOSIS — Z79899 Other long term (current) drug therapy: Secondary | ICD-10-CM | POA: Insufficient documentation

## 2019-08-13 DIAGNOSIS — R1310 Dysphagia, unspecified: Secondary | ICD-10-CM

## 2019-08-13 DIAGNOSIS — K222 Esophageal obstruction: Secondary | ICD-10-CM | POA: Diagnosis not present

## 2019-08-13 DIAGNOSIS — G4730 Sleep apnea, unspecified: Secondary | ICD-10-CM | POA: Insufficient documentation

## 2019-08-13 DIAGNOSIS — Z87891 Personal history of nicotine dependence: Secondary | ICD-10-CM | POA: Diagnosis not present

## 2019-08-13 DIAGNOSIS — E785 Hyperlipidemia, unspecified: Secondary | ICD-10-CM | POA: Diagnosis not present

## 2019-08-13 DIAGNOSIS — K2100 Gastro-esophageal reflux disease with esophagitis, without bleeding: Secondary | ICD-10-CM | POA: Diagnosis not present

## 2019-08-13 DIAGNOSIS — F329 Major depressive disorder, single episode, unspecified: Secondary | ICD-10-CM | POA: Insufficient documentation

## 2019-08-13 DIAGNOSIS — K449 Diaphragmatic hernia without obstruction or gangrene: Secondary | ICD-10-CM | POA: Diagnosis not present

## 2019-08-13 DIAGNOSIS — K2210 Ulcer of esophagus without bleeding: Secondary | ICD-10-CM | POA: Diagnosis not present

## 2019-08-13 DIAGNOSIS — R1319 Other dysphagia: Secondary | ICD-10-CM

## 2019-08-13 HISTORY — PX: MALONEY DILATION: SHX5535

## 2019-08-13 HISTORY — PX: ESOPHAGOGASTRODUODENOSCOPY (EGD) WITH PROPOFOL: SHX5813

## 2019-08-13 SURGERY — ESOPHAGOGASTRODUODENOSCOPY (EGD) WITH PROPOFOL
Anesthesia: General

## 2019-08-13 MED ORDER — HYDROMORPHONE HCL 1 MG/ML IJ SOLN: 0.2500 mg | INTRAMUSCULAR | Status: DC | PRN

## 2019-08-13 MED ORDER — LACTATED RINGERS IV SOLN
INTRAVENOUS | Status: DC
  Administered 2019-08-13: 08:00:00 via INTRAVENOUS

## 2019-08-13 MED ORDER — KETAMINE HCL 10 MG/ML IJ SOLN
INTRAMUSCULAR | Status: DC | PRN
  Administered 2019-08-13: 20 mg via INTRAVENOUS

## 2019-08-13 MED ORDER — PROMETHAZINE HCL 25 MG/ML IJ SOLN: 6.2500 mg | INTRAMUSCULAR | Status: DC | PRN

## 2019-08-13 MED ORDER — CHLORHEXIDINE GLUCONATE CLOTH 2 % EX PADS: 6.0000 | MEDICATED_PAD | Freq: Once | CUTANEOUS | Status: DC

## 2019-08-13 MED ORDER — KETAMINE HCL 50 MG/5ML IJ SOSY
PREFILLED_SYRINGE | INTRAMUSCULAR | Status: AC
  Filled 2019-08-13: qty 5

## 2019-08-13 MED ORDER — PROPOFOL 10 MG/ML IV BOLUS
INTRAVENOUS | Status: AC
  Filled 2019-08-13: qty 40

## 2019-08-13 MED ORDER — MIDAZOLAM HCL 2 MG/2ML IJ SOLN: 0.5000 mg | Freq: Once | INTRAMUSCULAR | Status: DC | PRN

## 2019-08-13 MED ORDER — HYDROCODONE-ACETAMINOPHEN 7.5-325 MG PO TABS: 1.0000 | ORAL_TABLET | Freq: Once | ORAL | Status: DC | PRN

## 2019-08-13 MED ORDER — PROPOFOL 500 MG/50ML IV EMUL
INTRAVENOUS | Status: DC | PRN
  Administered 2019-08-13: 125 ug/kg/min via INTRAVENOUS

## 2019-08-13 NOTE — Anesthesia Preprocedure Evaluation (Addendum)
Anesthesia Evaluation  Patient identified by MRN, date of birth, ID band Patient awake    Reviewed: Allergy & Precautions, NPO status , Patient's Chart, lab work & pertinent test results  Airway Mallampati: II  TM Distance: >3 FB Neck ROM: Full    Dental no notable dental hx. (+) Missing   Pulmonary neg pulmonary ROS, sleep apnea and Continuous Positive Airway Pressure Ventilation , former smoker,    Pulmonary exam normal breath sounds clear to auscultation       Cardiovascular Exercise Tolerance: Good negative cardio ROS Normal cardiovascular examI Rhythm:Regular Rate:Normal  Reports cathed remotely ~ 2005 Denies CP/MI/DOE    Neuro/Psych PSYCHIATRIC DISORDERS Anxiety Depression  Neuromuscular disease negative neurological ROS  negative psych ROS   GI/Hepatic negative GI ROS, Neg liver ROS, GERD  Medicated and Controlled,Denies any GERD Sx today    Endo/Other  negative endocrine ROS  Renal/GU negative Renal ROS  negative genitourinary   Musculoskeletal  (+) Arthritis , Rheumatoid disorders,  Pt on chronic pain meds for either psoriatic or RA   Abdominal   Peds negative pediatric ROS (+)  Hematology negative hematology ROS (+)   Anesthesia Other Findings   Reproductive/Obstetrics negative OB ROS                            Anesthesia Physical Anesthesia Plan  ASA: II  Anesthesia Plan: General   Post-op Pain Management:    Induction: Intravenous  PONV Risk Score and Plan: 2 and Propofol infusion and TIVA  Airway Management Planned: Nasal Cannula and Simple Face Mask  Additional Equipment:   Intra-op Plan:   Post-operative Plan:   Informed Consent: I have reviewed the patients History and Physical, chart, labs and discussed the procedure including the risks, benefits and alternatives for the proposed anesthesia with the patient or authorized representative who has indicated  his/her understanding and acceptance.     Dental advisory given  Plan Discussed with: CRNA  Anesthesia Plan Comments: (Plan Full PPE use  Plan GA with GETA as needed d/w pt -WTP with same after Q&A)        Anesthesia Quick Evaluation

## 2019-08-13 NOTE — Discharge Instructions (Addendum)
EGD Discharge instructions Please read the instructions outlined below and refer to this sheet in the next few weeks. These discharge instructions provide you with general information on caring for yourself after you leave the hospital. Your doctor may also give you specific instructions. While your treatment has been planned according to the most current medical practices available, unavoidable complications occasionally occur. If you have any problems or questions after discharge, please call your doctor. ACTIVITY  You may resume your regular activity but move at a slower pace for the next 24 hours.   Take frequent rest periods for the next 24 hours.   Walking will help expel (get rid of) the air and reduce the bloated feeling in your abdomen.   No driving for 24 hours (because of the anesthesia (medicine) used during the test).   You may shower.   Do not sign any important legal documents or operate any machinery for 24 hours (because of the anesthesia used during the test).  NUTRITION  Drink plenty of fluids.   You may resume your normal diet.   Begin with a light meal and progress to your normal diet.   Avoid alcoholic beverages for 24 hours or as instructed by your caregiver.  MEDICATIONS  You may resume your normal medications unless your caregiver tells you otherwise.  WHAT YOU CAN EXPECT TODAY  You may experience abdominal discomfort such as a feeling of fullness or gas pains.  FOLLOW-UP  Your doctor will discuss the results of your test with you.  SEEK IMMEDIATE MEDICAL ATTENTION IF ANY OF THE FOLLOWING OCCUR:  Excessive nausea (feeling sick to your stomach) and/or vomiting.   Severe abdominal pain and distention (swelling).   Trouble swallowing.   Temperature over 101 F (37.8 C).   Rectal bleeding or vomiting of blood.    GERD information provided  Increase Protonix to 40 mg twice daily  Office visit with [place] in 3 months  At patient request I spoke  to Ellene Route at [phone]     Gastroesophageal Reflux Disease, Adult Gastroesophageal reflux (GER) happens when acid from the stomach flows up into the tube that connects the mouth and the stomach (esophagus). Normally, food travels down the esophagus and stays in the stomach to be digested. With GER, food and stomach acid sometimes move back up into the esophagus. You may have a disease called gastroesophageal reflux disease (GERD) if the reflux:  Happens often.  Causes frequent or very bad symptoms.  Causes problems such as damage to the esophagus. When this happens, the esophagus becomes sore and swollen (inflamed). Over time, GERD can make small holes (ulcers) in the lining of the esophagus. What are the causes? This condition is caused by a problem with the muscle between the esophagus and the stomach. When this muscle is weak or not normal, it does not close properly to keep food and acid from coming back up from the stomach. The muscle can be weak because of:  Tobacco use.  Pregnancy.  Having a certain type of hernia (hiatal hernia).  Alcohol use.  Certain foods and drinks, such as coffee, chocolate, onions, and peppermint. What increases the risk? You are more likely to develop this condition if you:  Are overweight.  Have a disease that affects your connective tissue.  Use NSAID medicines. What are the signs or symptoms? Symptoms of this condition include:  Heartburn.  Difficult or painful swallowing.  The feeling of having a lump in the throat.  A bitter taste  in the mouth.  Bad breath.  Having a lot of saliva.  Having an upset or bloated stomach.  Belching.  Chest pain. Different conditions can cause chest pain. Make sure you see your doctor if you have chest pain.  Shortness of breath or noisy breathing (wheezing).  Ongoing (chronic) cough or a cough at night.  Wearing away of the surface of teeth (tooth enamel).  Weight loss. How is this  treated? Treatment will depend on how bad your symptoms are. Your doctor may suggest:  Changes to your diet.  Medicine.  Surgery. Follow these instructions at home: Eating and drinking   Follow a diet as told by your doctor. You may need to avoid foods and drinks such as: ? Coffee and tea (with or without caffeine). ? Drinks that contain alcohol. ? Energy drinks and sports drinks. ? Bubbly (carbonated) drinks or sodas. ? Chocolate and cocoa. ? Peppermint and mint flavorings. ? Garlic and onions. ? Horseradish. ? Spicy and acidic foods. These include peppers, chili powder, curry powder, vinegar, hot sauces, and BBQ sauce. ? Citrus fruit juices and citrus fruits, such as oranges, lemons, and limes. ? Tomato-based foods. These include red sauce, chili, salsa, and pizza with red sauce. ? Fried and fatty foods. These include donuts, french fries, potato chips, and high-fat dressings. ? High-fat meats. These include hot dogs, rib eye steak, sausage, ham, and bacon. ? High-fat dairy items, such as whole milk, butter, and cream cheese.  Eat small meals often. Avoid eating large meals.  Avoid drinking large amounts of liquid with your meals.  Avoid eating meals during the 2-3 hours before bedtime.  Avoid lying down right after you eat.  Do not exercise right after you eat. Lifestyle   Do not use any products that contain nicotine or tobacco. These include cigarettes, e-cigarettes, and chewing tobacco. If you need help quitting, ask your doctor.  Try to lower your stress. If you need help doing this, ask your doctor.  If you are overweight, lose an amount of weight that is healthy for you. Ask your doctor about a safe weight loss goal. General instructions  Pay attention to any changes in your symptoms.  Take over-the-counter and prescription medicines only as told by your doctor. Do not take aspirin, ibuprofen, or other NSAIDs unless your doctor says it is okay.  Wear loose  clothes. Do not wear anything tight around your waist.  Raise (elevate) the head of your bed about 6 inches (15 cm).  Avoid bending over if this makes your symptoms worse.  Keep all follow-up visits as told by your doctor. This is important. Contact a doctor if:  You have new symptoms.  You lose weight and you do not know why.  You have trouble swallowing or it hurts to swallow.  You have wheezing or a cough that keeps happening.  Your symptoms do not get better with treatment.  You have a hoarse voice. Get help right away if:  You have pain in your arms, neck, jaw, teeth, or back.  You feel sweaty, dizzy, or light-headed.  You have chest pain or shortness of breath.  You throw up (vomit) and your throw-up looks like blood or coffee grounds.  You pass out (faint).  Your poop (stool) is bloody or black.  You cannot swallow, drink, or eat. Summary  If a person has gastroesophageal reflux disease (GERD), food and stomach acid move back up into the esophagus and cause symptoms or problems such as damage  to the esophagus.  Treatment will depend on how bad your symptoms are.  Follow a diet as told by your doctor.  Take all medicines only as told by your doctor. This information is not intended to replace advice given to you by your health care provider. Make sure you discuss any questions you have with your health care provider. Document Released: 03/26/2008 Document Revised: 04/16/2018 Document Reviewed: 04/16/2018 Elsevier Patient Education  2020 [place] After These instructions provide you with information about caring for yourself after your procedure. Your health care provider may also give you more specific instructions. Your treatment has been planned according to current medical practices, but problems sometimes occur. Call your health care provider if you have any problems or questions after your procedure. What can  I expect after the procedure? After your procedure, you may:  Feel sleepy for several hours.  Feel clumsy and have poor balance for several hours.  Feel forgetful about what happened after the procedure.  Have poor judgment for several hours.  Feel nauseous or vomit.  Have a sore throat if you had a breathing tube during the procedure. Follow these instructions at home: For at least 24 hours after the procedure:      Have a responsible adult stay with you. It is important to have someone help care for you until you are awake and alert.  Rest as needed.  Do not: ? Participate in activities in which you could fall or become injured. ? Drive. ? Use heavy machinery. ? Drink alcohol. ? Take sleeping pills or medicines that cause drowsiness. ? Make important decisions or sign legal documents. ? Take care of children on your own. Eating and drinking  Follow the diet that is recommended by your health care provider.  If you vomit, drink water, juice, or soup when you can drink without vomiting.  Make sure you have little or no nausea before eating solid foods. General instructions  Take over-the-counter and prescription medicines only as told by your health care provider.  If you have sleep apnea, surgery and certain medicines can increase your risk for breathing problems. Follow instructions from your health care provider about wearing your sleep device: ? Anytime you are sleeping, including during daytime naps. ? While taking prescription pain medicines, sleeping medicines, or medicines that make you drowsy.  If you smoke, do not smoke without supervision.  Keep all follow-up visits as told by your health care provider. This is important. Contact a health care provider if:  You keep feeling nauseous or you keep vomiting.  You feel light-headed.  You develop a rash.  You have a fever. Get help right away if:  You have trouble breathing. Summary  For several  hours after your procedure, you may feel sleepy and have poor judgment.  Have a responsible adult stay with you for at least 24 hours or until you are awake and alert. This information is not intended to replace advice given to you by your health care provider. Make sure you discuss any questions you have with your health care provider. Document Released: 01/29/2016 Document Revised: 01/06/2018 Document Reviewed: 01/29/2016 Elsevier Patient Education  2020 [employer].

## 2019-08-13 NOTE — Op Note (Signed)
[facility] Patient Name: Alfred Jones Procedure Date: 08/13/2019 10:41 AM MRN: [id number] Date of Birth: 04/10/1951 Attending MD: Norvel Richards , MD CSN: [id number] Age: 68 Admit Type: Outpatient Procedure:                Upper GI endoscopy Indications:              Dysphagia Providers:                Norvel Richards, MD, Jeanann Lewandowsky. Sharon Seller, RN,                            Nelma Rothman, Technician Referring MD:              Medicines:                Propofol per Anesthesia Complications:            No immediate complications. Estimated Blood Loss:     Estimated blood loss was minimal. Procedure:                After obtaining informed consent, the endoscope was                            passed under direct vision. Throughout the                            procedure, the patient's blood pressure, pulse, and                            oxygen saturations were monitored continuously. The                            [id number] scope was introduced through the                            mouth, and advanced to the second part of duodenum. Scope In: 12:23:37 PM Scope Out: 12:27:06 PM Total Procedure Duration: 0 hours 3 minutes 29 seconds  Findings:      A prominent Schatzki ring was found at the gastroesophageal junction.       Overlying distal esophageal erosions also present. No tumor. No       Barrett's epithelium seen.      A small hiatal hernia was present.      The exam was otherwise without abnormality.      The duodenal bulb and second portion of the duodenum were normal. The       scope was withdrawn. Dilation was performed with a Maloney dilator with       mild resistance at 92 Fr. nice disruption with minimal bleeding on look       back. Impression:               -Prominent Schatzki ring. Dilated. Overlying                            erosive reflux esophagitis                           - Small hiatal hernia.                           -  The examination  was otherwise normal.                           - Normal duodenal bulb and second portion of the                            duodenum.                           - No specimens collected. Moderate Sedation:      Moderate (conscious) sedation was administered by the endoscopy nurse       and supervised by the endoscopist. The following parameters were       monitored: oxygen saturation, heart rate, blood pressure, respiratory       rate, EKG, adequacy of pulmonary ventilation, and response to care. Recommendation:           - Patient has a contact number available for                            emergencies. The signs and symptoms of potential                            delayed complications were discussed with the                            patient. Return to normal activities tomorrow.                            Written discharge instructions were provided to the                            patient.                           - Resume previous diet.                           - Continue present medications. Increase Protonix                            to 40 mg twice daily.                           - Return to my office in 3 months. Procedure Code(s):        --- Professional ---                           [phone], Esophagogastroduodenoscopy, flexible,                            transoral; diagnostic, including collection of                            specimen(s) by brushing or washing, when performed                            (  separate procedure)                           43450, Dilation of esophagus, by unguided sound or                            bougie, single or multiple passes Diagnosis Code(s):        --- Professional ---                           K22.2, Esophageal obstruction                           K44.9, Diaphragmatic hernia without obstruction or                            gangrene                           R13.10, Dysphagia, unspecified CPT copyright 2019 American Medical  Association. All rights reserved. The codes documented in this report are preliminary and upon coder review may  be revised to meet current compliance requirements. Cristopher Estimable. Seanne Chirico, MD Norvel Richards, MD 08/13/2019 12:51:45 PM This report has been signed electronically. Number of Addenda: 0

## 2019-08-13 NOTE — Anesthesia Postprocedure Evaluation (Signed)
Anesthesia Post Note  Patient: Alfred Jones  Procedure(s) Performed: ESOPHAGOGASTRODUODENOSCOPY (EGD) WITH PROPOFOL (N/A ) MALONEY DILATION (N/A )  Patient location during evaluation: PACU Anesthesia Type: MAC Level of consciousness: awake and alert, patient cooperative and oriented Pain management: pain level controlled Vital Signs Assessment: post-procedure vital signs reviewed and stable Respiratory status: nonlabored ventilation, respiratory function stable and spontaneous breathing Cardiovascular status: stable Postop Assessment: no apparent nausea or vomiting Anesthetic complications: no     Last Vitals:  Vitals:   08/13/19 0735 08/13/19 1234  BP: 112/72 (!) 96/53  Pulse: 80 74  Resp: 18 10  Temp: 36.8 C 36.8 C  SpO2: 95% 94%    Last Pain:  Vitals:   08/13/19 1234  TempSrc:   PainSc: 0-No pain                 Corbin Falck

## 2019-08-13 NOTE — H&P (Signed)
@LOGO @   Primary Care Physician:  Curlene Labrum, MD Primary Gastroenterologist:  Dr. Gala Romney  Pre-Procedure History & Physical: HPI:  Alfred Jones is a 68 y.o. male here for further evaluation of esophageal dysphagia.  Longstanding GERD.  He is for EGD with possible esophageal dilation as appropriate/feasible per plan.  Past Medical History:  Diagnosis Date  . Acid reflux   . Anxiety   . Arthritis    RHEUMATOID OR PSORIATIC ?  Marland Kitchen BCC (basal cell carcinoma of skin) 06/19/2007   left lower chest (CX35FU)  . BCC (basal cell carcinoma of skin) infiltrated 03/11/2018   right nose ([facility])  . BCC (basal cell carcinoma of skin) ulcerated 07/11/2010   left cheek   . Cancer ([place])   . Depression   . Enlarged prostate   . Hyperlipidemia   . Numbness   . Sleep apnea    TESTED BACK IN 2017  . Superficial basal cell carcinoma (BCC) 03/11/2018   left shoulder     Past Surgical History:  Procedure Laterality Date  . ANTERIOR CERVICAL DECOMP/DISCECTOMY FUSION N/A 09/23/2018   Procedure: Cervical Three-Four Cervical Four-Five Cervical Five-Six Anterior cervical decompression/discectomy/fusion;  Surgeon: Erline Levine, MD;  Location: [facility];  Service: Neurosurgery;  Laterality: N/A;  Cervical Three-Four Cervical Four-Five Cervical Five-Six Anterior cervical decompression/discectomy/fusion  . CARDIAC CATHETERIZATION     20 YRS AGO AT [facility]  . EYE SURGERY     METAL REMOVED FROM LEFT EYE  (IN OFFICE)  . TONSILLECTOMY     as a child    Prior to Admission medications   Medication Sig Start Date End Date Taking? Authorizing Provider  acetaminophen (TYLENOL) 500 MG tablet Take 1,000 mg by mouth 2 (two) times daily.    Yes [provider]  alfuzosin (UROXATRAL) 10 MG 24 hr tablet Take 10 mg by mouth daily with breakfast.   Yes [provider]  Apremilast (OTEZLA) 30 MG TABS Take 30 mg by mouth daily.    Yes [provider]  Co-Enzyme Q-10 100 MG CAPS Take 200 mg by  mouth at bedtime.   Yes [provider]  doxylamine, Sleep, (UNISOM) 25 MG tablet Take 25 mg by mouth at bedtime.   Yes [provider]  DULoxetine (CYMBALTA) 30 MG capsule Take 30 mg by mouth at bedtime.   Yes [provider]  gabapentin (NEURONTIN) 400 MG capsule Take 400 mg by mouth 2 (two) times daily.   Yes [provider]  methocarbamol (ROBAXIN) 500 MG tablet Take 1 tablet (500 mg total) by mouth every 6 (six) hours as needed for muscle spasms. 09/24/18  Yes Erline Levine, MD  oxyCODONE 10 MG TABS Take 0.5-1 tablets (5-10 mg total) by mouth every 4 (four) hours as needed for severe pain ((score 7 to 10)). Patient taking differently: Take 10 mg by mouth at bedtime.  09/24/18  Yes Erline Levine, MD  pantoprazole (PROTONIX) 40 MG tablet Take 40 mg by mouth at bedtime.    Yes [provider]  rosuvastatin (CRESTOR) 5 MG tablet Take 5 mg by mouth every Thursday.    Yes [provider]  tadalafil (CIALIS) 5 MG tablet Take 5 mg by mouth daily.   Yes [provider]  Vitamin D, Ergocalciferol, (DRISDOL) 1.25 MG (50000 UT) CAPS capsule Take 50,000 Units by mouth every Thursday.    Yes [provider]  DULoxetine (CYMBALTA) 60 MG capsule Take 1 capsule (60 mg total) by mouth daily. Please call for an  appt or request PCP to manage future refills. Patient not taking: Reported on 08/07/2019 05/10/19   Marcial Pacas, MD  gabapentin (NEURONTIN) 100 MG capsule Take up to three capsules TID Patient not taking: Reported on 08/07/2019 06/04/18   Marcial Pacas, MD    Allergies as of 06/10/2019  . (No Known Allergies)    Family History  Problem Relation Age of Onset  . Heart disease Mother   . Diabetes Mother   . Heart attack Mother   . Heart disease Father   . Diabetes Father   . Colon cancer Neg Hx     Social History   Socioeconomic History  . Marital status: Married    Spouse name: Not on file  . Number of children: 2  . Years of  education: 25  . Highest education level: Not on file  Occupational History  . Occupation: Manufacturing systems engineer  . Financial resource strain: Not on file  . Food insecurity    Worry: Not on file    Inability: Not on file  . Transportation needs    Medical: Not on file    Non-medical: Not on file  Tobacco Use  . Smoking status: Former Smoker    Packs/day: 1.00    Years: 16.00    Pack years: 16.00    Types: Cigarettes    Quit date: 10/22/1986    Years since quitting: 32.8  . Smokeless tobacco: Never Used  Substance and Sexual Activity  . Alcohol use: No  . Drug use: No  . Sexual activity: Not on file  Lifestyle  . Physical activity    Days per week: Not on file    Minutes per session: Not on file  . Stress: Not on file  Relationships  . Social Herbalist on phone: Not on file    Gets together: Not on file    Attends religious service: Not on file    Active member of club or organization: Not on file    Attends meetings of clubs or organizations: Not on file    Relationship status: Not on file  . Intimate partner violence    Fear of current or ex partner: Not on file    Emotionally abused: Not on file    Physically abused: Not on file    Forced sexual activity: Not on file  Other Topics Concern  . Not on file  Social History Narrative   Lives at home with his wife and son.   Right-handed.   2-4 cups caffeine per day.    Review of Systems: See HPI, otherwise negative ROS  Physical Exam: BP 112/72   Pulse 80   Temp 98.3 F (36.8 C) (Oral)   Resp 18   SpO2 95%  General:   Alert,  Well-developed, well-nourished, pleasant and cooperative in NAD Neck:  Supple; no masses or thyromegaly. No significant cervical adenopathy. Lungs:  Clear throughout to auscultation.   No wheezes, crackles, or rhonchi. No acute distress. Heart:  Regular rate and rhythm; no murmurs, clicks, rubs,  or gallops. Abdomen: Non-distended, normal bowel sounds.  Soft and nontender  without appreciable mass or hepatosplenomegaly.  Pulses:  Normal pulses noted. Extremities:  Without clubbing or edema.  Impression/Plan: 68 year old male with esophageal dysphagia.  Here for further evaluation.  Offered the patient a EGD with esophageal dilation as feasible/appropriate per plan.     Notice: This dictation was prepared with Dragon dictation along with smaller phrase technology. Any transcriptional  errors that result from this process are unintentional and may not be corrected upon review.

## 2019-08-13 NOTE — Transfer of Care (Signed)
Immediate Anesthesia Transfer of Care Note  Patient: Alfred Jones  Procedure(s) Performed: ESOPHAGOGASTRODUODENOSCOPY (EGD) WITH PROPOFOL (N/A ) MALONEY DILATION (N/A )  Patient Location: PACU  Anesthesia Type:MAC  Level of Consciousness: awake, alert , oriented and patient cooperative  Airway & Oxygen Therapy: Patient Spontanous Breathing  Post-op Assessment: Report given to RN, Post -op Vital signs reviewed and stable and Patient moving all extremities X 4  Post vital signs: Reviewed and stable  Last Vitals:  Vitals Value Taken Time  BP 96/53 08/13/19 1232  Temp    Pulse 74 08/13/19 1233  Resp 10 08/13/19 1233  SpO2 94 % 08/13/19 1233  Vitals shown include unvalidated device data.  Last Pain:  Vitals:   08/13/19 1219  TempSrc:   PainSc: 0-No pain         Complications: No apparent anesthesia complications

## 2019-08-19 ENCOUNTER — Encounter (HOSPITAL_COMMUNITY): Payer: Self-pay | Admitting: Internal Medicine

## 2019-08-25 DIAGNOSIS — E782 Mixed hyperlipidemia: Secondary | ICD-10-CM | POA: Diagnosis not present

## 2019-08-25 DIAGNOSIS — K219 Gastro-esophageal reflux disease without esophagitis: Secondary | ICD-10-CM | POA: Diagnosis not present

## 2019-08-25 DIAGNOSIS — L570 Actinic keratosis: Secondary | ICD-10-CM | POA: Diagnosis not present

## 2019-08-25 DIAGNOSIS — L821 Other seborrheic keratosis: Secondary | ICD-10-CM | POA: Diagnosis not present

## 2019-08-25 DIAGNOSIS — E559 Vitamin D deficiency, unspecified: Secondary | ICD-10-CM | POA: Diagnosis not present

## 2019-08-25 DIAGNOSIS — R5383 Other fatigue: Secondary | ICD-10-CM | POA: Diagnosis not present

## 2019-08-25 DIAGNOSIS — D229 Melanocytic nevi, unspecified: Secondary | ICD-10-CM | POA: Diagnosis not present

## 2019-08-25 DIAGNOSIS — D485 Neoplasm of uncertain behavior of skin: Secondary | ICD-10-CM | POA: Diagnosis not present

## 2019-08-25 DIAGNOSIS — L739 Follicular disorder, unspecified: Secondary | ICD-10-CM | POA: Diagnosis not present

## 2019-08-28 DIAGNOSIS — Z23 Encounter for immunization: Secondary | ICD-10-CM | POA: Diagnosis not present

## 2019-08-28 DIAGNOSIS — L4050 Arthropathic psoriasis, unspecified: Secondary | ICD-10-CM | POA: Diagnosis not present

## 2019-08-28 DIAGNOSIS — D473 Essential (hemorrhagic) thrombocythemia: Secondary | ICD-10-CM | POA: Diagnosis not present

## 2019-08-28 DIAGNOSIS — M4712 Other spondylosis with myelopathy, cervical region: Secondary | ICD-10-CM | POA: Diagnosis not present

## 2019-08-28 DIAGNOSIS — Z6839 Body mass index (BMI) 39.0-39.9, adult: Secondary | ICD-10-CM | POA: Diagnosis not present

## 2019-08-28 DIAGNOSIS — M2550 Pain in unspecified joint: Secondary | ICD-10-CM | POA: Diagnosis not present

## 2019-08-28 DIAGNOSIS — M4722 Other spondylosis with radiculopathy, cervical region: Secondary | ICD-10-CM | POA: Diagnosis not present

## 2019-08-28 DIAGNOSIS — G4733 Obstructive sleep apnea (adult) (pediatric): Secondary | ICD-10-CM | POA: Diagnosis not present

## 2019-09-08 DIAGNOSIS — Z6833 Body mass index (BMI) 33.0-33.9, adult: Secondary | ICD-10-CM | POA: Diagnosis not present

## 2019-09-08 DIAGNOSIS — L603 Nail dystrophy: Secondary | ICD-10-CM | POA: Diagnosis not present

## 2019-09-08 DIAGNOSIS — M2550 Pain in unspecified joint: Secondary | ICD-10-CM | POA: Diagnosis not present

## 2019-09-08 DIAGNOSIS — D0439 Carcinoma in situ of skin of other parts of face: Secondary | ICD-10-CM | POA: Diagnosis not present

## 2019-09-08 DIAGNOSIS — M1990 Unspecified osteoarthritis, unspecified site: Secondary | ICD-10-CM | POA: Diagnosis not present

## 2019-09-08 DIAGNOSIS — L4050 Arthropathic psoriasis, unspecified: Secondary | ICD-10-CM | POA: Diagnosis not present

## 2019-09-08 DIAGNOSIS — E669 Obesity, unspecified: Secondary | ICD-10-CM | POA: Diagnosis not present

## 2019-09-08 DIAGNOSIS — M150 Primary generalized (osteo)arthritis: Secondary | ICD-10-CM | POA: Diagnosis not present

## 2019-09-08 DIAGNOSIS — G894 Chronic pain syndrome: Secondary | ICD-10-CM | POA: Diagnosis not present

## 2019-09-08 DIAGNOSIS — M5136 Other intervertebral disc degeneration, lumbar region: Secondary | ICD-10-CM | POA: Diagnosis not present

## 2019-10-05 DIAGNOSIS — M5126 Other intervertebral disc displacement, lumbar region: Secondary | ICD-10-CM | POA: Diagnosis not present

## 2019-10-05 DIAGNOSIS — M5416 Radiculopathy, lumbar region: Secondary | ICD-10-CM | POA: Diagnosis not present

## 2019-10-05 DIAGNOSIS — M545 Low back pain: Secondary | ICD-10-CM | POA: Diagnosis not present

## 2019-10-05 DIAGNOSIS — Z6833 Body mass index (BMI) 33.0-33.9, adult: Secondary | ICD-10-CM | POA: Diagnosis not present

## 2019-10-05 DIAGNOSIS — M48061 Spinal stenosis, lumbar region without neurogenic claudication: Secondary | ICD-10-CM | POA: Diagnosis not present

## 2019-10-05 DIAGNOSIS — I10 Essential (primary) hypertension: Secondary | ICD-10-CM | POA: Diagnosis not present

## 2019-10-06 DIAGNOSIS — M545 Low back pain: Secondary | ICD-10-CM | POA: Diagnosis not present

## 2019-10-06 DIAGNOSIS — Z6833 Body mass index (BMI) 33.0-33.9, adult: Secondary | ICD-10-CM | POA: Diagnosis not present

## 2019-10-06 DIAGNOSIS — M5126 Other intervertebral disc displacement, lumbar region: Secondary | ICD-10-CM | POA: Diagnosis not present

## 2019-10-06 DIAGNOSIS — R030 Elevated blood-pressure reading, without diagnosis of hypertension: Secondary | ICD-10-CM | POA: Diagnosis not present

## 2019-10-07 ENCOUNTER — Ambulatory Visit: Payer: Medicare Other | Admitting: Nurse Practitioner

## 2019-10-14 DIAGNOSIS — M25572 Pain in left ankle and joints of left foot: Secondary | ICD-10-CM | POA: Diagnosis not present

## 2019-11-09 ENCOUNTER — Encounter: Payer: Self-pay | Admitting: Urology

## 2019-11-17 ENCOUNTER — Ambulatory Visit (INDEPENDENT_AMBULATORY_CARE_PROVIDER_SITE_OTHER): Payer: Medicare Other | Admitting: Nurse Practitioner

## 2019-11-17 ENCOUNTER — Other Ambulatory Visit: Payer: Self-pay

## 2019-11-17 ENCOUNTER — Encounter: Payer: Self-pay | Admitting: Nurse Practitioner

## 2019-11-17 VITALS — BP 131/76 | HR 74 | Temp 97.7°F | Ht 70.0 in | Wt 235.8 lb

## 2019-11-17 DIAGNOSIS — R1319 Other dysphagia: Secondary | ICD-10-CM | POA: Diagnosis not present

## 2019-11-17 DIAGNOSIS — K5900 Constipation, unspecified: Secondary | ICD-10-CM | POA: Diagnosis not present

## 2019-11-17 DIAGNOSIS — K219 Gastro-esophageal reflux disease without esophagitis: Secondary | ICD-10-CM | POA: Diagnosis not present

## 2019-11-17 NOTE — Progress Notes (Signed)
Referring Provider: Curlene Labrum, MD Primary Care Physician:  Curlene Labrum, MD Primary GI:  Dr. Gala Romney  Chief Complaint  Patient presents with  . Gastroesophageal Reflux    constipation    HPI:   Alfred Jones is a 69 y.o. male who presents for follow-up on GERD and dysphagia.  The patient was last seen in our office 06/10/2019 for GERD, dysphagia, constipation.  A previous EGD with circumferential esophageal erosions in the distal esophagus, narrowing distal esophagus suggesting soft peptic stricture component but no obvious carcinoma.  Dilation was completed and recommended repeat endoscopy in 3 months.  This did not occur.  Colonoscopy up-to-date 09/18/2010 with recommended repeat in 10 years (2021).  At his last visit he noted chronic reflux and solid food dysphagia about once a month which can take 30 minutes to pass, associated odynophagia.  GERD generally well controlled on Protonix daily has constipation but minimal water intake and bowel movement 2 to 4 days with hard stools and straining.  Sometimes pasty stools.  No other overt GI complaints.  Recommended continue Protonix, EGD with possible dilation, ER precautions, increase water intake, continue stool softener, consider adding MiraLAX, follow-up in 4 months.  EGD completed 08/13/2019 and found prominent Schatzki's ring status post dilation with overlying erosive reflux esophagitis, small hiatal hernia, otherwise normal.  Recommended increase Protonix to 40 mg twice daily.  Today he states he's doing ok overall. Dysphagia significantly improved since dilation. Currently on Protonix 40 mg bid. His GERD is doing well overall on PPI. Rare to no known GERD breakthrough unless he has a dietary indiscretions such as orange juice, hot sauce, etc. Constipation is about the same. He is taking 2 stools softeners in the morning and 2 at night. Has some nausea in the morning, which he thinks is related to all the medications he's  taking. Has a bowel movement about every 2-4 days, hard stools, straining. Still not drinking much water. Denies significant hematochezia (except rare toilet tissue hematochezia with a lot of straining), melena, fever, chills, unintentional weight loss. Denies URI or flu-like symptoms. Denies loss of sense of taste or smell. Denies chest pain, dyspnea, dizziness, lightheadedness, syncope, near syncope. Denies any other upper or lower GI symptoms.  Past Medical History:  Diagnosis Date  . Acid reflux   . Anxiety   . Arthritis    RHEUMATOID OR PSORIATIC ?  Marland Kitchen BCC (basal cell carcinoma of skin) 06/19/2007   left lower chest (CX35FU)  . BCC (basal cell carcinoma of skin) infiltrated 03/11/2018   right nose (MOHS)  . BCC (basal cell carcinoma of skin) ulcerated 07/11/2010   left cheek   . Cancer ([place])   . Depression   . Enlarged prostate   . Hyperlipidemia   . Numbness   . Sleep apnea    TESTED BACK IN 2017  . Superficial basal cell carcinoma (BCC) 03/11/2018   left shoulder     Past Surgical History:  Procedure Laterality Date  . ANTERIOR CERVICAL DECOMP/DISCECTOMY FUSION N/A 09/23/2018   Procedure: Cervical Three-Four Cervical Four-Five Cervical Five-Six Anterior cervical decompression/discectomy/fusion;  Surgeon: Erline Levine, MD;  Location: [facility];  Service: Neurosurgery;  Laterality: N/A;  Cervical Three-Four Cervical Four-Five Cervical Five-Six Anterior cervical decompression/discectomy/fusion  . CARDIAC CATHETERIZATION     20 YRS AGO AT [facility]  . ESOPHAGOGASTRODUODENOSCOPY (EGD) WITH PROPOFOL N/A 08/13/2019   Procedure: ESOPHAGOGASTRODUODENOSCOPY (EGD) WITH PROPOFOL;  Surgeon: Daneil Dolin, MD;  [facility];  Service: Endoscopy;  Laterality: N/A;  8:30am  . EYE SURGERY     METAL REMOVED FROM LEFT EYE  (IN OFFICE)  . MALONEY DILATION N/A 08/13/2019   Procedure: Venia Minks DILATION;  Surgeon: Daneil Dolin, MD;  [facility];  Service: Endoscopy;   Laterality: N/A;  . TONSILLECTOMY     as a child    Current Outpatient Medications  Medication Sig Dispense Refill  . acetaminophen (TYLENOL) 500 MG tablet Take 1,000 mg by mouth 2 (two) times daily.     Marland Kitchen alfuzosin (UROXATRAL) 10 MG 24 hr tablet Take 10 mg by mouth daily with breakfast.    . Apremilast (OTEZLA) 30 MG TABS Take 30 mg by mouth daily.     . celecoxib (CELEBREX) 100 MG capsule Take 100 mg by mouth 3 (three) times daily.    Marland Kitchen Co-Enzyme Q-10 100 MG CAPS Take 200 mg by mouth at bedtime.    Marland Kitchen doxylamine, Sleep, (UNISOM) 25 MG tablet Take 25 mg by mouth at bedtime.    . DULoxetine (CYMBALTA) 30 MG capsule Take 30 mg by mouth at bedtime.    . gabapentin (NEURONTIN) 400 MG capsule Take 400 mg by mouth 2 (two) times daily.    . Melatonin 10 MG CAPS Take by mouth at bedtime.    . methocarbamol (ROBAXIN) 500 MG tablet Take 1 tablet (500 mg total) by mouth every 6 (six) hours as needed for muscle spasms. 60 tablet 1  . oxyCODONE 10 MG TABS Take 0.5-1 tablets (5-10 mg total) by mouth every 4 (four) hours as needed for severe pain ((score 7 to 10)). (Patient taking differently: Take 10 mg by mouth at bedtime. ) 60 tablet 0  . pantoprazole (PROTONIX) 40 MG tablet Take 40 mg by mouth 2 (two) times daily.     . rosuvastatin (CRESTOR) 5 MG tablet Take 5 mg by mouth every Thursday.     . tadalafil (CIALIS) 5 MG tablet Take 5 mg by mouth daily.    . TURMERIC PO Take by mouth daily.    . Vitamin D, Ergocalciferol, (DRISDOL) 1.25 MG (50000 UT) CAPS capsule Take 50,000 Units by mouth every Thursday.      No current facility-administered medications for this visit.    Allergies as of 11/17/2019  . (No Known Allergies)    Family History  Problem Relation Age of Onset  . Heart disease Mother   . Diabetes Mother   . Heart attack Mother   . Heart disease Father   . Diabetes Father   . Colon cancer Neg Hx     Social History   Socioeconomic History  . Marital status: Married    Spouse  name: Not on file  . Number of children: 2  . Years of education: 12  . Highest education level: Not on file  Occupational History  . Occupation: Dealer  Tobacco Use  . Smoking status: Former Smoker    Packs/day: 1.00    Years: 16.00    Pack years: 16.00    Types: Cigarettes    Quit date: 10/22/1986    Years since quitting: 33.0  . Smokeless tobacco: Never Used  Substance and Sexual Activity  . Alcohol use: No  . Drug use: No  . Sexual activity: Not on file  Other Topics Concern  . Not on file  Social History Narrative   Lives at home with his wife and son.   Right-handed.   2-4 cups caffeine per day.   Social Determinants of Health  Financial Resource Strain:   . Difficulty of Paying Living Expenses: Not on file  Food Insecurity:   . Worried About Charity fundraiser in the Last Year: Not on file  . Ran Out of Food in the Last Year: Not on file  Transportation Needs:   . Lack of Transportation (Medical): Not on file  . Lack of Transportation (Non-Medical): Not on file  Physical Activity:   . Days of Exercise per Week: Not on file  . Minutes of Exercise per Session: Not on file  Stress:   . Feeling of Stress : Not on file  Social Connections:   . Frequency of Communication with Friends and Family: Not on file  . Frequency of Social Gatherings with Friends and Family: Not on file  . Attends Religious Services: Not on file  . Active Member of Clubs or Organizations: Not on file  . Attends Archivist Meetings: Not on file  . Marital Status: Not on file    Review of Systems: General: Negative for anorexia, weight loss, fever, chills, fatigue, weakness. ENT: Negative for hoarseness, difficulty swallowing . CV: Negative for chest pain, angina, palpitations, peripheral edema.  Respiratory: Negative for dyspnea at rest, cough, sputum, wheezing.  GI: See history of present illness. Endo: Negative for unusual weight change.  Heme: Negative for bruising or  bleeding. Allergy: Negative for rash or hives.   Physical Exam: BP 131/76   Pulse 74   Temp 97.7 F (36.5 C)   Ht 5\' 10"  (1.778 m)   Wt 235 lb 12.8 oz (107 kg)   BMI 33.83 kg/m  General:   Alert and oriented. Pleasant and cooperative. Well-nourished and well-developed.  Eyes:  Without icterus, sclera clear and conjunctiva pink.  Ears:  Normal auditory acuity. Cardiovascular:  S1, S2 present without murmurs appreciated. Extremities without clubbing or edema. Respiratory:  Clear to auscultation bilaterally. No wheezes, rales, or rhonchi. No distress.  Gastrointestinal:  +BS, soft, non-tender and non-distended. No HSM noted. No guarding or rebound. No masses appreciated.  Rectal:  Deferred  Musculoskalatal:  Symmetrical without gross deformities. Neurologic:  Alert and oriented x4;  grossly normal neurologically. Psych:  Alert and cooperative. Normal mood and affect. Heme/Lymph/Immune: No excessive bruising noted.    11/17/2019 10:02 AM   Disclaimer: This note was dictated with voice recognition software. Similar sounding words can inadvertently be transcribed and may not be corrected upon review.

## 2019-11-17 NOTE — Assessment & Plan Note (Signed)
No improvement in constipation.  He is taking approximately 4 stool softeners a day.  Still not drinking adequate water.  I recommended he change to 1 stool softener a day, add MiraLAX once a day twice a day as needed this will help with his water intake as well.  Call in 2 to 3 weeks.  If no improvement we can consider low-dose Linzess like 72 mcg daily.  Return for follow-up in 6 months.  Call for any questions or concerns before then.

## 2019-11-17 NOTE — Patient Instructions (Signed)
Your health issues we discussed today were:   GERD and dysphagia (swallowing difficulties): 1. I am glad you are feeling well! 2. Continue taking Protonix 40 mg twice daily for GERD symptoms 3. This will also help with the reflux changes in your esophagus 4. Call us if you have any worsening or severe symptoms.  Specifically, call us if you have any worsening swallowing difficulties  Constipation: 1. As we discussed, take Colace stool softener 100 mg once a day 2. Start taking MiraLAX powder mixed into a drink of your choice (preferably water) once a day 3. You can increase this to twice a day if needed 4. If it is too much, you can reduce it to a half a dose a day 5. Call us in 2 to 3 weeks and let us know if it is helping you have more consistent, more regular/frequent bowel movements 6. Call us for any worsening or severe symptoms  Overall I recommend:  1. Continue taking your other current medications 2. Return for follow-up in 6 months 3. Call us if you have any questions or concerns   ---------------------------------------------------------------  COVID-19 Vaccine Information can be found at: [email or web address] For questions related to vaccine distribution or appointments, please email [email or web address] or call [phone].   ---------------------------------------------------------------   At [facility] we value your feedback. You may receive a survey about your visit today. Please share your experience as we strive to create trusting relationships with our patients to provide genuine, compassionate, quality care.  We appreciate your understanding and patience as we review any laboratory studies, imaging, and other diagnostic tests that are ordered as we care for you. Our office policy is 5 business days for review of these results, and any emergent or urgent results are addressed in a timely  manner for your best interest. If you do not hear from our office in 1 week, please contact us.   We also encourage the use of MyChart, which contains your medical information for your review as well. If you are not enrolled in this feature, an access code is on this after visit summary for your convenience. Thank you for allowing [place] to be involved in your care.  It was great to see you today!  I hope you have a great day!!

## 2019-11-17 NOTE — Assessment & Plan Note (Signed)
Dysphagia with EGD/dilation at improve his swallowing symptoms.  No breakthrough dysphagia at this time.  Continue to monitor and follow-up in 6 months.

## 2019-11-17 NOTE — Assessment & Plan Note (Signed)
Currently doing well on twice daily PPI.  Recommend he continue this based on findings from his EGD with reflux esophagitis.  Follow-up in 6 months.

## 2019-11-18 DIAGNOSIS — R208 Other disturbances of skin sensation: Secondary | ICD-10-CM | POA: Diagnosis not present

## 2019-11-18 DIAGNOSIS — L723 Sebaceous cyst: Secondary | ICD-10-CM | POA: Diagnosis not present

## 2019-11-19 DIAGNOSIS — M545 Low back pain: Secondary | ICD-10-CM | POA: Diagnosis not present

## 2019-11-26 DIAGNOSIS — D473 Essential (hemorrhagic) thrombocythemia: Secondary | ICD-10-CM | POA: Diagnosis not present

## 2019-11-30 DIAGNOSIS — F5104 Psychophysiologic insomnia: Secondary | ICD-10-CM | POA: Diagnosis not present

## 2019-11-30 DIAGNOSIS — Z6833 Body mass index (BMI) 33.0-33.9, adult: Secondary | ICD-10-CM | POA: Diagnosis not present

## 2019-11-30 DIAGNOSIS — G4733 Obstructive sleep apnea (adult) (pediatric): Secondary | ICD-10-CM | POA: Diagnosis not present

## 2019-11-30 DIAGNOSIS — M4712 Other spondylosis with myelopathy, cervical region: Secondary | ICD-10-CM | POA: Diagnosis not present

## 2019-11-30 DIAGNOSIS — L4050 Arthropathic psoriasis, unspecified: Secondary | ICD-10-CM | POA: Diagnosis not present

## 2019-11-30 DIAGNOSIS — L299 Pruritus, unspecified: Secondary | ICD-10-CM | POA: Diagnosis not present

## 2019-11-30 DIAGNOSIS — D473 Essential (hemorrhagic) thrombocythemia: Secondary | ICD-10-CM | POA: Diagnosis not present

## 2019-11-30 DIAGNOSIS — Z0001 Encounter for general adult medical examination with abnormal findings: Secondary | ICD-10-CM | POA: Diagnosis not present

## 2019-12-07 DIAGNOSIS — L409 Psoriasis, unspecified: Secondary | ICD-10-CM | POA: Diagnosis not present

## 2019-12-07 DIAGNOSIS — D473 Essential (hemorrhagic) thrombocythemia: Secondary | ICD-10-CM | POA: Diagnosis not present

## 2019-12-07 DIAGNOSIS — Z6834 Body mass index (BMI) 34.0-34.9, adult: Secondary | ICD-10-CM | POA: Diagnosis not present

## 2019-12-13 DIAGNOSIS — L409 Psoriasis, unspecified: Secondary | ICD-10-CM | POA: Insufficient documentation

## 2019-12-15 DIAGNOSIS — D473 Essential (hemorrhagic) thrombocythemia: Secondary | ICD-10-CM | POA: Diagnosis not present

## 2019-12-29 DIAGNOSIS — Z6835 Body mass index (BMI) 35.0-35.9, adult: Secondary | ICD-10-CM | POA: Diagnosis not present

## 2019-12-29 DIAGNOSIS — D473 Essential (hemorrhagic) thrombocythemia: Secondary | ICD-10-CM | POA: Diagnosis not present

## 2019-12-29 DIAGNOSIS — I7381 Erythromelalgia: Secondary | ICD-10-CM | POA: Diagnosis not present

## 2020-01-01 DIAGNOSIS — M5416 Radiculopathy, lumbar region: Secondary | ICD-10-CM | POA: Diagnosis not present

## 2020-01-01 DIAGNOSIS — M5126 Other intervertebral disc displacement, lumbar region: Secondary | ICD-10-CM | POA: Diagnosis not present

## 2020-01-01 DIAGNOSIS — M48061 Spinal stenosis, lumbar region without neurogenic claudication: Secondary | ICD-10-CM | POA: Diagnosis not present

## 2020-01-04 DIAGNOSIS — I7381 Erythromelalgia: Secondary | ICD-10-CM | POA: Insufficient documentation

## 2020-01-05 DIAGNOSIS — D473 Essential (hemorrhagic) thrombocythemia: Secondary | ICD-10-CM | POA: Diagnosis not present

## 2020-01-07 DIAGNOSIS — L4050 Arthropathic psoriasis, unspecified: Secondary | ICD-10-CM | POA: Diagnosis not present

## 2020-01-07 DIAGNOSIS — Z6833 Body mass index (BMI) 33.0-33.9, adult: Secondary | ICD-10-CM | POA: Diagnosis not present

## 2020-01-07 DIAGNOSIS — M2550 Pain in unspecified joint: Secondary | ICD-10-CM | POA: Diagnosis not present

## 2020-01-07 DIAGNOSIS — D473 Essential (hemorrhagic) thrombocythemia: Secondary | ICD-10-CM | POA: Diagnosis not present

## 2020-01-07 DIAGNOSIS — E669 Obesity, unspecified: Secondary | ICD-10-CM | POA: Diagnosis not present

## 2020-01-07 DIAGNOSIS — L603 Nail dystrophy: Secondary | ICD-10-CM | POA: Diagnosis not present

## 2020-01-07 DIAGNOSIS — M1990 Unspecified osteoarthritis, unspecified site: Secondary | ICD-10-CM | POA: Diagnosis not present

## 2020-01-07 DIAGNOSIS — M150 Primary generalized (osteo)arthritis: Secondary | ICD-10-CM | POA: Diagnosis not present

## 2020-01-07 DIAGNOSIS — D0439 Carcinoma in situ of skin of other parts of face: Secondary | ICD-10-CM | POA: Diagnosis not present

## 2020-01-07 DIAGNOSIS — M5136 Other intervertebral disc degeneration, lumbar region: Secondary | ICD-10-CM | POA: Diagnosis not present

## 2020-01-07 DIAGNOSIS — G894 Chronic pain syndrome: Secondary | ICD-10-CM | POA: Diagnosis not present

## 2020-01-12 DIAGNOSIS — D473 Essential (hemorrhagic) thrombocythemia: Secondary | ICD-10-CM | POA: Diagnosis not present

## 2020-01-15 ENCOUNTER — Other Ambulatory Visit: Payer: Self-pay

## 2020-01-15 ENCOUNTER — Ambulatory Visit (INDEPENDENT_AMBULATORY_CARE_PROVIDER_SITE_OTHER): Payer: Medicare Other | Admitting: Urology

## 2020-01-15 VITALS — BP 113/71 | HR 69 | Temp 98.1°F | Ht 71.0 in | Wt 225.0 lb

## 2020-01-15 DIAGNOSIS — N5201 Erectile dysfunction due to arterial insufficiency: Secondary | ICD-10-CM | POA: Diagnosis not present

## 2020-01-15 DIAGNOSIS — N401 Enlarged prostate with lower urinary tract symptoms: Secondary | ICD-10-CM

## 2020-01-15 DIAGNOSIS — R3912 Poor urinary stream: Secondary | ICD-10-CM

## 2020-01-15 DIAGNOSIS — N138 Other obstructive and reflux uropathy: Secondary | ICD-10-CM | POA: Diagnosis not present

## 2020-01-15 LAB — POCT URINALYSIS DIPSTICK
Bilirubin, UA: NEGATIVE
Blood, UA: NEGATIVE
Glucose, UA: NEGATIVE
Ketones, UA: NEGATIVE
Leukocytes, UA: NEGATIVE
Nitrite, UA: NEGATIVE
Protein, UA: POSITIVE — AB
Spec Grav, UA: 1.01 (ref 1.010–1.025)
Urobilinogen, UA: 1 E.U./dL
pH, UA: 6 (ref 5.0–8.0)

## 2020-01-15 MED ORDER — NYSTATIN 100000 UNIT/GM EX POWD: 1.0000 "application " | Freq: Three times a day (TID) | CUTANEOUS | 3 refills | Status: DC

## 2020-01-15 MED ORDER — ALFUZOSIN HCL ER 10 MG PO TB24: 10.0000 mg | ORAL_TABLET | Freq: Every day | ORAL | 3 refills | Status: DC

## 2020-01-15 MED ORDER — TADALAFIL 5 MG PO TABS: 5.0000 mg | ORAL_TABLET | Freq: Every day | ORAL | 11 refills | Status: DC

## 2020-01-15 NOTE — Progress Notes (Signed)
Subjective:  1. BPH with urinary obstruction   2. Weak urinary stream   3. Erectile dysfunction due to arterial insufficiency      [name] returns today in f/u for a 6 month f/u for  his history of BPH with BOO and ED. He was given tadalafil for the BPH and BOO as well as the ED on 06/05/19 and additional sildenafil to use with the tadalafil for the ED and is doing better with that.   He remains on alfuzosin at this time. He remains on cymbalta. He has stable LUTS with an  IPSS of 15. He has a reduced stream unless he has a full bladder.  He still has some intermittency. He has occasional hesitancy. He has nocturia x 1. He attempted a flowrate but had insufficient volume. He has no dysuria or hematuria.Marland Kitchen His last PSA was 1.8 in 8/20.  It was 1.7 in 10/19 and 1.02 in 2017. His testosterone level was 450 at last check. He has had improvement in the ejaculation with cessation of citalopram.   He has come off of oxycodone except for rare use at night. He is also on gabapentin.   He has a new diagnosis of thrombocytosis and he is on Hydroxyurea.        ROS:  ROS:  A complete review of systems was performed.  All systems are negative except for pertinent findings as noted.   Review of Systems  Constitutional: Negative for weight loss.  Respiratory: Negative for shortness of breath.   Cardiovascular: Negative for chest pain.  Gastrointestinal: Positive for constipation.  Musculoskeletal: Positive for back pain and joint pain.    No Known Allergies  Outpatient Encounter Medications as of 01/15/2020  Medication Sig  . acetaminophen (TYLENOL) 500 MG tablet Take 1,000 mg by mouth 2 (two) times daily.   Marland Kitchen alfuzosin (UROXATRAL) 10 MG 24 hr tablet Take 1 tablet (10 mg total) by mouth daily with breakfast.  . aspirin EC 81 MG tablet Take 81 mg by mouth daily.  . clindamycin (CLINDAGEL) 1 % gel APPLY BETWEEN TOES AFTER BATHING  . Co-Enzyme Q-10 100 MG CAPS Take 200 mg by mouth at bedtime.  Mariane Baumgarten Sodium 100 MG capsule Take by mouth.  . DULoxetine (CYMBALTA) 30 MG capsule Take 30 mg by mouth at bedtime.  . eszopiclone (LUNESTA) 1 MG TABS tablet Take 1 mg by mouth at bedtime as needed.  . gabapentin (NEURONTIN) 400 MG capsule Take 400 mg by mouth 2 (two) times daily.  . hydroxyurea (HYDREA) 500 MG capsule Take by mouth.  . Melatonin 10 MG CAPS Take by mouth at bedtime.  . methocarbamol (ROBAXIN) 500 MG tablet Take 1 tablet (500 mg total) by mouth every 6 (six) hours as needed for muscle spasms.  . Multiple Vitamin (MULTI-VITAMIN) tablet Take by mouth.  . oxyCODONE 10 MG TABS Take 0.5-1 tablets (5-10 mg total) by mouth every 4 (four) hours as needed for severe pain ((score 7 to 10)).  Marland Kitchen pantoprazole (PROTONIX) 40 MG tablet Take 40 mg by mouth 2 (two) times daily.   . predniSONE (DELTASONE) 5 MG tablet Take 5-10 mg by mouth daily.  . rosuvastatin (CRESTOR) 5 MG tablet Take 5 mg by mouth every Thursday.   . tadalafil (CIALIS) 5 MG tablet Take 1 tablet (5 mg total) by mouth daily.  . [DISCONTINUED] alfuzosin (UROXATRAL) 10 MG 24 hr tablet Take 10 mg by mouth daily with breakfast.  . [DISCONTINUED] tadalafil (CIALIS) 5 MG tablet Take  5 mg by mouth daily.  Marland Kitchen Apremilast (OTEZLA) 30 MG TABS Take 30 mg by mouth daily.   . celecoxib (CELEBREX) 100 MG capsule Take 100 mg by mouth 3 (three) times daily.  . Cholecalciferol 25 MCG (1000 UT) tablet Take by mouth.  . doxylamine, Sleep, (UNISOM) 25 MG tablet Take 25 mg by mouth at bedtime.  . traZODone (DESYREL) 50 MG tablet Take 50 mg by mouth every evening.  . TURMERIC PO Take by mouth daily.  . Vitamin D, Ergocalciferol, (DRISDOL) 1.25 MG (50000 UT) CAPS capsule Take 50,000 Units by mouth every Thursday.   . zolpidem (AMBIEN) 5 MG tablet Take by mouth.   No facility-administered encounter medications on file as of 01/15/2020.    Past Medical History:  Diagnosis Date  . Acid reflux   . Anxiety   . Arthritis    RHEUMATOID OR PSORIATIC ?   Marland Kitchen BCC (basal cell carcinoma of skin) 06/19/2007   left lower chest (CX35FU)  . BCC (basal cell carcinoma of skin) infiltrated 03/11/2018   right nose (MOHS)  . BCC (basal cell carcinoma of skin) ulcerated 07/11/2010   left cheek   . Cancer ([place])   . Depression   . Enlarged prostate   . Hyperlipidemia   . Numbness   . Sleep apnea    TESTED BACK IN 2017  . Superficial basal cell carcinoma (BCC) 03/11/2018   left shoulder     Past Surgical History:  Procedure Laterality Date  . ANTERIOR CERVICAL DECOMP/DISCECTOMY FUSION N/A 09/23/2018   Procedure: Cervical Three-Four Cervical Four-Five Cervical Five-Six Anterior cervical decompression/discectomy/fusion;  Surgeon: Erline Levine, MD;  Location: [facility];  Service: Neurosurgery;  Laterality: N/A;  Cervical Three-Four Cervical Four-Five Cervical Five-Six Anterior cervical decompression/discectomy/fusion  . CARDIAC CATHETERIZATION     20 YRS AGO AT [facility]  . ESOPHAGOGASTRODUODENOSCOPY (EGD) WITH PROPOFOL N/A 08/13/2019   Procedure: ESOPHAGOGASTRODUODENOSCOPY (EGD) WITH PROPOFOL;  Surgeon: Daneil Dolin, MD;  [facility];  Service: Endoscopy;  Laterality: N/A;  8:30am  . EYE SURGERY     METAL REMOVED FROM LEFT EYE  (IN OFFICE)  . MALONEY DILATION N/A 08/13/2019   Procedure: Venia Minks DILATION;  Surgeon: Daneil Dolin, MD;  [facility];  Service: Endoscopy;  Laterality: N/A;  . TONSILLECTOMY     as a child    Social History   Socioeconomic History  . Marital status: Married    Spouse name: Not on file  . Number of children: 2  . Years of education: 93  . Highest education level: Not on file  Occupational History  . Occupation: Dealer  Tobacco Use  . Smoking status: Former Smoker    Packs/day: 1.00    Years: 16.00    Pack years: 16.00    Types: Cigarettes    Quit date: 10/22/1986    Years since quitting: 33.2  . Smokeless tobacco: Never Used  Substance and Sexual Activity  . Alcohol use: No  . Drug  use: No  . Sexual activity: Not on file  Other Topics Concern  . Not on file  Social History Narrative   Lives at home with his wife and son.   Right-handed.   2-4 cups caffeine per day.   Social Determinants of Health   Financial Resource Strain:   . Difficulty of Paying Living Expenses:   Food Insecurity:   . Worried About Charity fundraiser in the Last Year:   . [place] in the Last  Year:   Transportation Needs:   . Film/video editor (Medical):   Marland Kitchen Lack of Transportation (Non-Medical):   Physical Activity:   . Days of Exercise per Week:   . Minutes of Exercise per Session:   Stress:   . Feeling of Stress :   Social Connections:   . Frequency of Communication with Friends and Family:   . Frequency of Social Gatherings with Friends and Family:   . Attends Religious Services:   . Active Member of Clubs or Organizations:   . Attends Archivist Meetings:   Marland Kitchen Marital Status:   Intimate Partner Violence:   . Fear of Current or Ex-Partner:   . Emotionally Abused:   Marland Kitchen Physically Abused:   . Sexually Abused:     Family History  Problem Relation Age of Onset  . Heart disease Mother   . Diabetes Mother   . Heart attack Mother   . Heart disease Father   . Diabetes Father   . Colon cancer Neg Hx        Objective: Vitals:   01/15/20 1102  BP: 113/71  Pulse: 69  Temp: 98.1 F (36.7 C)     Physical Exam Vitals reviewed.  Constitutional:      Appearance: Normal appearance.  Genitourinary:    Comments: Uncircumcised phallus without balanitis or meatal stenosis. Scrotum is normal but there is a small area of erythema at the posterior base of the scrotum. Testes and epididymis are normal. AP no other lesions. NST without mass. Prostate 1.5+ without nodule.  SV's non-palpable.  Neurological:     Mental Status: He is alert.     Lab Results:  Results for orders placed or performed in visit on 01/15/20 (from the past 24 hour(s))  POCT  urinalysis dipstick     Status: Abnormal   Collection Time: 01/15/20 11:21 AM  Result Value Ref Range   Color, UA yellow    Clarity, UA     Glucose, UA Negative Negative   Bilirubin, UA neg    Ketones, UA neg    Spec Grav, UA 1.010 1.010 - 1.025   Blood, UA neg    pH, UA 6.0 5.0 - 8.0   Protein, UA Positive (A) Negative   Urobilinogen, UA 1.0 0.2 or 1.0 E.U./dL   Nitrite, UA neg    Leukocytes, UA Negative Negative   Appearance     Odor      BMET No results for input(s): NA, K, CL, CO2, GLUCOSE, BUN, CREATININE, CALCIUM in the last 72 hours. PSA PSA  Date Value Ref Range Status  12/02/2015 1.12 <=4.00 ng/mL Final    Comment:    Test Methodology: ECLIA PSA (Electrochemiluminescence Immunoassay)   For PSA values from 2.5-4.0, particularly in younger men <73 years old, the AUA and NCCN suggest testing for % Free PSA (3515) and evaluation of the rate of increase in PSA (PSA velocity).    No results found for: TESTOSTERONE  I have reviewed [facility].   Plt count is 607K.   Studies/Results: No results found.    Assessment & Plan: BPH with BOO.  He is doing ok on alfuzosin and tadalafil which I have refilled.  He will return in 6 months with a PSA.   Perineal rash.   I will send nystatin.  ED.  I refilled the tadalafil and he will call with the dose of the sildenafil so I can sent that in.     Meds ordered this encounter  Medications  . alfuzosin (UROXATRAL) 10 MG 24 hr tablet    Sig: Take 1 tablet (10 mg total) by mouth daily with breakfast.    Dispense:  90 tablet    Refill:  3  . tadalafil (CIALIS) 5 MG tablet    Sig: Take 1 tablet (5 mg total) by mouth daily.    Dispense:  30 tablet    Refill:  11     Orders Placed This Encounter  Procedures  . PSA, total and free    Standing Status:   Future    Standing Expiration Date:   01/14/2021  . POCT urinalysis dipstick      Return in about 6 months (around 07/17/2020) for for BPH with BOO. .   CC:  Burdine, Virgina Evener, MD      Irine Seal 01/15/2020

## 2020-01-20 DIAGNOSIS — D473 Essential (hemorrhagic) thrombocythemia: Secondary | ICD-10-CM | POA: Diagnosis not present

## 2020-01-20 DIAGNOSIS — I7381 Erythromelalgia: Secondary | ICD-10-CM | POA: Diagnosis not present

## 2020-01-20 DIAGNOSIS — Z09 Encounter for follow-up examination after completed treatment for conditions other than malignant neoplasm: Secondary | ICD-10-CM | POA: Diagnosis not present

## 2020-01-20 DIAGNOSIS — Z6835 Body mass index (BMI) 35.0-35.9, adult: Secondary | ICD-10-CM | POA: Diagnosis not present

## 2020-01-27 DIAGNOSIS — Z09 Encounter for follow-up examination after completed treatment for conditions other than malignant neoplasm: Secondary | ICD-10-CM | POA: Insufficient documentation

## 2020-01-27 DIAGNOSIS — D473 Essential (hemorrhagic) thrombocythemia: Secondary | ICD-10-CM | POA: Insufficient documentation

## 2020-02-08 DIAGNOSIS — M1990 Unspecified osteoarthritis, unspecified site: Secondary | ICD-10-CM | POA: Diagnosis not present

## 2020-02-08 DIAGNOSIS — G894 Chronic pain syndrome: Secondary | ICD-10-CM | POA: Diagnosis not present

## 2020-02-08 DIAGNOSIS — L4050 Arthropathic psoriasis, unspecified: Secondary | ICD-10-CM | POA: Diagnosis not present

## 2020-02-08 DIAGNOSIS — Z6832 Body mass index (BMI) 32.0-32.9, adult: Secondary | ICD-10-CM | POA: Diagnosis not present

## 2020-02-08 DIAGNOSIS — E669 Obesity, unspecified: Secondary | ICD-10-CM | POA: Diagnosis not present

## 2020-02-08 DIAGNOSIS — L603 Nail dystrophy: Secondary | ICD-10-CM | POA: Diagnosis not present

## 2020-02-08 DIAGNOSIS — D473 Essential (hemorrhagic) thrombocythemia: Secondary | ICD-10-CM | POA: Diagnosis not present

## 2020-02-08 DIAGNOSIS — M150 Primary generalized (osteo)arthritis: Secondary | ICD-10-CM | POA: Diagnosis not present

## 2020-02-08 DIAGNOSIS — D0439 Carcinoma in situ of skin of other parts of face: Secondary | ICD-10-CM | POA: Diagnosis not present

## 2020-02-08 DIAGNOSIS — M2550 Pain in unspecified joint: Secondary | ICD-10-CM | POA: Diagnosis not present

## 2020-02-08 DIAGNOSIS — M5136 Other intervertebral disc degeneration, lumbar region: Secondary | ICD-10-CM | POA: Diagnosis not present

## 2020-02-12 DIAGNOSIS — D473 Essential (hemorrhagic) thrombocythemia: Secondary | ICD-10-CM | POA: Diagnosis not present

## 2020-03-04 DIAGNOSIS — I7381 Erythromelalgia: Secondary | ICD-10-CM | POA: Diagnosis not present

## 2020-03-04 DIAGNOSIS — Z6832 Body mass index (BMI) 32.0-32.9, adult: Secondary | ICD-10-CM | POA: Diagnosis not present

## 2020-03-04 DIAGNOSIS — D473 Essential (hemorrhagic) thrombocythemia: Secondary | ICD-10-CM | POA: Diagnosis not present

## 2020-03-04 DIAGNOSIS — Z09 Encounter for follow-up examination after completed treatment for conditions other than malignant neoplasm: Secondary | ICD-10-CM | POA: Diagnosis not present

## 2020-03-16 DIAGNOSIS — L4050 Arthropathic psoriasis, unspecified: Secondary | ICD-10-CM | POA: Diagnosis not present

## 2020-03-16 DIAGNOSIS — I739 Peripheral vascular disease, unspecified: Secondary | ICD-10-CM | POA: Diagnosis not present

## 2020-03-16 DIAGNOSIS — F5104 Psychophysiologic insomnia: Secondary | ICD-10-CM | POA: Diagnosis not present

## 2020-03-16 DIAGNOSIS — M2550 Pain in unspecified joint: Secondary | ICD-10-CM | POA: Diagnosis not present

## 2020-03-16 DIAGNOSIS — M791 Myalgia, unspecified site: Secondary | ICD-10-CM | POA: Diagnosis not present

## 2020-03-16 DIAGNOSIS — Z6834 Body mass index (BMI) 34.0-34.9, adult: Secondary | ICD-10-CM | POA: Diagnosis not present

## 2020-03-25 DIAGNOSIS — M79669 Pain in unspecified lower leg: Secondary | ICD-10-CM | POA: Diagnosis not present

## 2020-03-31 DIAGNOSIS — D649 Anemia, unspecified: Secondary | ICD-10-CM | POA: Diagnosis not present

## 2020-03-31 DIAGNOSIS — K219 Gastro-esophageal reflux disease without esophagitis: Secondary | ICD-10-CM | POA: Diagnosis not present

## 2020-03-31 DIAGNOSIS — E782 Mixed hyperlipidemia: Secondary | ICD-10-CM | POA: Diagnosis not present

## 2020-03-31 DIAGNOSIS — G4733 Obstructive sleep apnea (adult) (pediatric): Secondary | ICD-10-CM | POA: Diagnosis not present

## 2020-03-31 DIAGNOSIS — D529 Folate deficiency anemia, unspecified: Secondary | ICD-10-CM | POA: Diagnosis not present

## 2020-03-31 DIAGNOSIS — R5383 Other fatigue: Secondary | ICD-10-CM | POA: Diagnosis not present

## 2020-03-31 DIAGNOSIS — N400 Enlarged prostate without lower urinary tract symptoms: Secondary | ICD-10-CM | POA: Diagnosis not present

## 2020-03-31 DIAGNOSIS — D519 Vitamin B12 deficiency anemia, unspecified: Secondary | ICD-10-CM | POA: Diagnosis not present

## 2020-04-01 DIAGNOSIS — I739 Peripheral vascular disease, unspecified: Secondary | ICD-10-CM | POA: Diagnosis not present

## 2020-05-04 ENCOUNTER — Other Ambulatory Visit: Payer: Self-pay

## 2020-05-04 ENCOUNTER — Ambulatory Visit (INDEPENDENT_AMBULATORY_CARE_PROVIDER_SITE_OTHER): Payer: Medicare Other | Admitting: Dermatology

## 2020-05-04 DIAGNOSIS — D225 Melanocytic nevi of trunk: Secondary | ICD-10-CM

## 2020-05-04 DIAGNOSIS — Z85828 Personal history of other malignant neoplasm of skin: Secondary | ICD-10-CM | POA: Diagnosis not present

## 2020-05-04 DIAGNOSIS — D229 Melanocytic nevi, unspecified: Secondary | ICD-10-CM

## 2020-05-04 DIAGNOSIS — I781 Nevus, non-neoplastic: Secondary | ICD-10-CM

## 2020-05-06 DIAGNOSIS — D473 Essential (hemorrhagic) thrombocythemia: Secondary | ICD-10-CM | POA: Diagnosis not present

## 2020-05-06 DIAGNOSIS — Z6832 Body mass index (BMI) 32.0-32.9, adult: Secondary | ICD-10-CM | POA: Diagnosis not present

## 2020-05-06 DIAGNOSIS — M48061 Spinal stenosis, lumbar region without neurogenic claudication: Secondary | ICD-10-CM | POA: Diagnosis not present

## 2020-05-06 DIAGNOSIS — Z7189 Other specified counseling: Secondary | ICD-10-CM | POA: Diagnosis not present

## 2020-05-06 DIAGNOSIS — Z09 Encounter for follow-up examination after completed treatment for conditions other than malignant neoplasm: Secondary | ICD-10-CM | POA: Diagnosis not present

## 2020-05-10 DIAGNOSIS — G629 Polyneuropathy, unspecified: Secondary | ICD-10-CM | POA: Diagnosis not present

## 2020-05-10 DIAGNOSIS — M150 Primary generalized (osteo)arthritis: Secondary | ICD-10-CM | POA: Diagnosis not present

## 2020-05-10 DIAGNOSIS — D473 Essential (hemorrhagic) thrombocythemia: Secondary | ICD-10-CM | POA: Diagnosis not present

## 2020-05-10 DIAGNOSIS — G894 Chronic pain syndrome: Secondary | ICD-10-CM | POA: Diagnosis not present

## 2020-05-10 DIAGNOSIS — Z6835 Body mass index (BMI) 35.0-35.9, adult: Secondary | ICD-10-CM | POA: Diagnosis not present

## 2020-05-10 DIAGNOSIS — E669 Obesity, unspecified: Secondary | ICD-10-CM | POA: Diagnosis not present

## 2020-05-10 DIAGNOSIS — M2550 Pain in unspecified joint: Secondary | ICD-10-CM | POA: Diagnosis not present

## 2020-05-10 DIAGNOSIS — L4050 Arthropathic psoriasis, unspecified: Secondary | ICD-10-CM | POA: Diagnosis not present

## 2020-05-17 ENCOUNTER — Encounter: Payer: Self-pay | Admitting: Nurse Practitioner

## 2020-05-17 ENCOUNTER — Ambulatory Visit (INDEPENDENT_AMBULATORY_CARE_PROVIDER_SITE_OTHER): Payer: Medicare Other | Admitting: Nurse Practitioner

## 2020-05-17 ENCOUNTER — Other Ambulatory Visit: Payer: Self-pay

## 2020-05-17 VITALS — BP 115/71 | HR 66 | Temp 97.2°F | Ht 70.0 in | Wt 231.8 lb

## 2020-05-17 DIAGNOSIS — R1319 Other dysphagia: Secondary | ICD-10-CM | POA: Diagnosis not present

## 2020-05-17 DIAGNOSIS — K921 Melena: Secondary | ICD-10-CM | POA: Diagnosis not present

## 2020-05-17 DIAGNOSIS — K5900 Constipation, unspecified: Secondary | ICD-10-CM

## 2020-05-17 NOTE — Assessment & Plan Note (Signed)
The patient notes rare, very mild toilet tissue hematochezia when he is particularly constipated.  He is currently due for repeat colonoscopy this year.  He is amendable to proceeding and we will schedule.  Likely benign anorectal source in the setting of constipation.  Follow-up in 3 months.  Proceed with TCS on propofol/MAC with Dr. Gala Romney in near future: the risks, benefits, and alternatives have been discussed with the patient in detail. The patient states understanding and desires to proceed.  The patient is currently on Cymbalta, Neurontin, Robaxin, oxycodone, trazodone, Ambien. The patient is not on any other anticoagulants, anxiolytics, chronic pain medications, antidepressants, antidiabetics, or iron supplements.  ASA II (BMI >30 but <40; sleep apnea)

## 2020-05-17 NOTE — Patient Instructions (Signed)
Your health issues we discussed today were:   Dysphagia (swallowing difficulties): 1. I am glad you are doing better with this 2. Let us know if you have any worsening or recurrent symptoms  Constipation with mild, rare rectal bleeding: 1. As we discussed, try to increase your water gradually and aim for an ultimate goal of at least 64 ounces a day 2. Try to remember to use MiraLAX daily.  You can try half dose once a day to help prevent bloating and discomfort 3. We will schedule your colonoscopy for you, which you are due for.  This also help ensure that the bleeding is just from constipation 4. Further recommendations will follow 5. Call us if you have any worsening or severe symptoms  Overall I recommend:  1. Continue your other current medications 2. Return for follow-up in 3 months 3. Call us if you have any questions or concerns   ---------------------------------------------------------------  I am glad you have gotten your COVID-19 vaccination!  Even though you are fully vaccinated you should continue to follow CDC and state/local guidelines.  ---------------------------------------------------------------   At [facility] we value your feedback. You may receive a survey about your visit today. Please share your experience as we strive to create trusting relationships with our patients to provide genuine, compassionate, quality care.  We appreciate your understanding and patience as we review any laboratory studies, imaging, and other diagnostic tests that are ordered as we care for you. Our office policy is 5 business days for review of these results, and any emergent or urgent results are addressed in a timely manner for your best interest. If you do not hear from our office in 1 week, please contact us.   We also encourage the use of MyChart, which contains your medical information for your review as well. If you are not enrolled in this feature, an access  code is on this after visit summary for your convenience. Thank you for allowing [place] to be involved in your care.  It was great to see you today!  I hope you have a great Summer!!

## 2020-05-17 NOTE — Progress Notes (Signed)
Referring Provider: Curlene Labrum, MD Primary Care Physician:  Curlene Labrum, MD Primary GI:  Dr. Gala Romney  Chief Complaint  Patient presents with  . Constipation  . Dysphagia    occ but better since dilation    HPI:   Alfred Jones is a 69 y.o. male who presents for follow-up on GERD and constipation.  The patient was last seen in our office 11/17/2019 for dysphagia, GERD, constipation.  Previous EGD with circumferential esophageal erosions in the distal esophagus, narrowing distal esophagus suggesting small peptic stricture component but no obvious carcinoma status post dilation and recommended repeat endoscopy in 3 months which did not happen.  Colonoscopy up-to-date in 2011 with recommended repeat in 10 years (2021).  Due to persistent dysphagia symptoms and EGD was updated 08/13/2019 and found prominent Schatzki's ring status post dilation with overlying erosive reflux esophagitis, small hiatal hernia, otherwise normal.  Recommended increase Protonix to 40 mg twice daily.  At his last visit he notes significant improvement in dysphagia since dilation.  On Protonix 40 mg twice daily with GERD overall doing well with minimal to no breakthrough except for dietary indiscretions.  Constipation about the same with 2 stool softeners in the morning and 2 at night, some nausea in the morning thinks it is related to the medications he is taking.  Bowel movement every 3 to 4 days with hard stools and straining and still not drinking much water.  No hematochezia.  No other overt GI complaints.  Recommended continue Protonix 40 mg twice daily, Colace stool softener 100 mg once daily, MiraLAX 1-2 times a day, progress report in 2 to 3 weeks, follow-up in 6 months.  No progress report was called our office.  Today he states he is doing okay overall. His dysphagia symptoms are improved. His constipation is still ongoing. He uses the MiraLAX intermittently but not uncommon to forget. Still not  drinking adequate water. States "all this is my fault, I know." Has some rare abdominal pain with his constipation, hasn't noticed if it improves after a bowel movement. States he has rare scant hematochezia if he's particularly constipated. Denies N/V, melena, fever, chills, unintentional weight loss. Denies URI or flu-like symptoms. Denies loss of sense of taste or smell. Denies URI or flu-like symptoms. Denies loss of sense of taste or smell. The patient has not received COVID-19 vaccination(s). They are not interested in COVID-19 scheduling information. Denies chest pain, dyspnea, dizziness, lightheadedness, syncope, near syncope. Denies any other upper or lower GI symptoms.  Past Medical History:  Diagnosis Date  . Acid reflux   . Anxiety   . Arthritis    RHEUMATOID OR PSORIATIC ?  Marland Kitchen BCC (basal cell carcinoma of skin) 06/19/2007   left lower chest (CX35FU)  . BCC (basal cell carcinoma of skin) infiltrated 03/11/2018   right nose (MOHS)  . BCC (basal cell carcinoma of skin) ulcerated 07/11/2010   left cheek   . Cancer ([place])   . Depression   . Enlarged prostate   . Hyperlipidemia   . Numbness   . Sleep apnea    TESTED BACK IN 2017  . Superficial basal cell carcinoma (BCC) 03/11/2018   left shoulder     Past Surgical History:  Procedure Laterality Date  . ANTERIOR CERVICAL DECOMP/DISCECTOMY FUSION N/A 09/23/2018   Procedure: Cervical Three-Four Cervical Four-Five Cervical Five-Six Anterior cervical decompression/discectomy/fusion;  Surgeon: Erline Levine, MD;  Location: [facility];  Service: Neurosurgery;  Laterality: N/A;  Cervical Three-Four Cervical Four-Five  Cervical Five-Six Anterior cervical decompression/discectomy/fusion  . CARDIAC CATHETERIZATION     20 YRS AGO AT [facility]  . ESOPHAGOGASTRODUODENOSCOPY (EGD) WITH PROPOFOL N/A 08/13/2019   Procedure: ESOPHAGOGASTRODUODENOSCOPY (EGD) WITH PROPOFOL;  Surgeon: Daneil Dolin, MD;  [facility];  Service: Endoscopy;   Laterality: N/A;  8:30am  . EYE SURGERY     METAL REMOVED FROM LEFT EYE  (IN OFFICE)  . MALONEY DILATION N/A 08/13/2019   Procedure: Venia Minks DILATION;  Surgeon: Daneil Dolin, MD;  Location: [facility];  Service: Endoscopy;  Laterality: N/A;  . TONSILLECTOMY     as a child    Current Outpatient Medications  Medication Sig Dispense Refill  . acetaminophen (TYLENOL) 500 MG tablet Take 1,000 mg by mouth 2 (two) times daily.     Marland Kitchen alfuzosin (UROXATRAL) 10 MG 24 hr tablet Take 1 tablet (10 mg total) by mouth daily with breakfast. 90 tablet 3  . aspirin EC 81 MG tablet Take 81 mg by mouth daily.    . celecoxib (CELEBREX) 100 MG capsule Take 100 mg by mouth daily.     Marland Kitchen Co-Enzyme Q-10 100 MG CAPS Take 200 mg by mouth at bedtime.    Mariane Baumgarten Sodium 100 MG capsule Take 200 mg by mouth 2 (two) times daily.     . DULoxetine (CYMBALTA) 60 MG capsule Take 60 mg by mouth at bedtime.    . gabapentin (NEURONTIN) 300 MG capsule Take 300 mg by mouth 2 (two) times daily. Weaning off    . Melatonin 10 MG CAPS Take by mouth as needed.     . methocarbamol (ROBAXIN) 500 MG tablet Take 1 tablet (500 mg total) by mouth every 6 (six) hours as needed for muscle spasms. (Patient taking differently: Take 500 mg by mouth daily. ) 60 tablet 1  . Multiple Vitamin (MULTI-VITAMIN) tablet Take by mouth.    . oxyCODONE 10 MG TABS Take 0.5-1 tablets (5-10 mg total) by mouth every 4 (four) hours as needed for severe pain ((score 7 to 10)). (Patient taking differently: Take 10 mg by mouth at bedtime. ) 60 tablet 0  . pantoprazole (PROTONIX) 40 MG tablet Take 40 mg by mouth 2 (two) times daily.     . rosuvastatin (CRESTOR) 5 MG tablet Take 5 mg by mouth every Thursday.     . tadalafil (CIALIS) 5 MG tablet Take 1 tablet (5 mg total) by mouth daily. 30 tablet 11  . traZODone (DESYREL) 50 MG tablet Take 50 mg by mouth every evening.    Marland Kitchen Apremilast (OTEZLA) 30 MG TABS Take 30 mg by mouth daily.  (Patient not taking: Reported  on 05/17/2020)    . Cholecalciferol 25 MCG (1000 UT) tablet Take by mouth. (Patient not taking: Reported on 05/17/2020)    . clindamycin (CLINDAGEL) 1 % gel APPLY BETWEEN TOES AFTER BATHING (Patient not taking: Reported on 05/17/2020)    . doxylamine, Sleep, (UNISOM) 25 MG tablet Take 25 mg by mouth at bedtime. (Patient not taking: Reported on 05/17/2020)    . DULoxetine (CYMBALTA) 30 MG capsule Take 30 mg by mouth at bedtime. (Patient not taking: Reported on 05/17/2020)    . eszopiclone (LUNESTA) 1 MG TABS tablet Take 1 mg by mouth at bedtime as needed. (Patient not taking: Reported on 05/17/2020)    . gabapentin (NEURONTIN) 400 MG capsule Take 400 mg by mouth 2 (two) times daily. (Patient not taking: Reported on 05/17/2020)    . hydroxyurea (HYDREA) 500 MG capsule Take  by mouth. (Patient not taking: Reported on 05/17/2020)    . nystatin (MYCOSTATIN/NYSTOP) powder Apply 1 application topically 3 (three) times daily. To affected area. (Patient not taking: Reported on 05/17/2020) 30 g 3  . TURMERIC PO Take by mouth daily. (Patient not taking: Reported on 05/17/2020)    . Vitamin D, Ergocalciferol, (DRISDOL) 1.25 MG (50000 UT) CAPS capsule Take 50,000 Units by mouth every Thursday.  (Patient not taking: Reported on 05/17/2020)    . zolpidem (AMBIEN) 5 MG tablet Take by mouth. (Patient not taking: Reported on 05/17/2020)     No current facility-administered medications for this visit.    Allergies as of 05/17/2020  . (No Known Allergies)    Family History  Problem Relation Age of Onset  . Heart disease Mother   . Diabetes Mother   . Heart attack Mother   . Heart disease Father   . Diabetes Father   . Colon cancer Neg Hx     Social History   Socioeconomic History  . Marital status: Married    Spouse name: Not on file  . Number of children: 2  . Years of education: 12  . Highest education level: Not on file  Occupational History  . Occupation: Dealer  Tobacco Use  . Smoking status: Former  Smoker    Packs/day: 1.00    Years: 16.00    Pack years: 16.00    Types: Cigarettes    Quit date: 10/22/1986    Years since quitting: 33.5  . Smokeless tobacco: Never Used  Vaping Use  . Vaping Use: Never used  Substance and Sexual Activity  . Alcohol use: No  . Drug use: No  . Sexual activity: Not on file  Other Topics Concern  . Not on file  Social History Narrative   Lives at home with his wife and son.   Right-handed.   2-4 cups caffeine per day.   Social Determinants of Health   Financial Resource Strain:   . Difficulty of Paying Living Expenses:   Food Insecurity:   . Worried About Charity fundraiser in the Last Year:   . Arboriculturist in the Last Year:   Transportation Needs:   . Film/video editor (Medical):   Marland Kitchen Lack of Transportation (Non-Medical):   Physical Activity:   . Days of Exercise per Week:   . Minutes of Exercise per Session:   Stress:   . Feeling of Stress :   Social Connections:   . Frequency of Communication with Friends and Family:   . Frequency of Social Gatherings with Friends and Family:   . Attends Religious Services:   . Active Member of Clubs or Organizations:   . Attends Archivist Meetings:   Marland Kitchen Marital Status:     Subjective: Review of Systems  Constitutional: Negative for chills, fever, malaise/fatigue and weight loss.  HENT: Negative for congestion and sore throat.   Respiratory: Negative for cough and shortness of breath.   Cardiovascular: Negative for chest pain and palpitations.  Gastrointestinal: Negative for abdominal pain, blood in stool, diarrhea, melena, nausea and vomiting.  Musculoskeletal: Negative for joint pain and myalgias.  Skin: Negative for rash.  Neurological: Negative for dizziness and weakness.  Endo/Heme/Allergies: Does not bruise/bleed easily.  Psychiatric/Behavioral: Negative for depression. The patient is not nervous/anxious.   All other systems reviewed and are  negative.    Objective: BP 115/71   Pulse 66   Temp (!) 97.2 F (36.2 C) (Oral)  Ht 5\' 10"  (1.778 m)   Wt (!) 231 lb 12.8 oz (105.1 kg)   BMI 33.26 kg/m  Physical Exam Vitals and nursing note reviewed.  Constitutional:      General: He is not in acute distress.    Appearance: Normal appearance. He is not ill-appearing, toxic-appearing or diaphoretic.  HENT:     Head: Normocephalic and atraumatic.     Nose: No congestion or rhinorrhea.  Eyes:     General: No scleral icterus. Cardiovascular:     Rate and Rhythm: Normal rate and regular rhythm.     Heart sounds: Normal heart sounds.  Pulmonary:     Effort: Pulmonary effort is normal.     Breath sounds: Normal breath sounds.  Abdominal:     General: Bowel sounds are normal. There is no distension.     Palpations: Abdomen is soft. There is no hepatomegaly, splenomegaly or mass.     Tenderness: There is no abdominal tenderness. There is no guarding or rebound.     Hernia: No hernia is present.  Musculoskeletal:     Cervical back: Neck supple.  Skin:    General: Skin is warm and dry.     Coloration: Skin is not jaundiced.     Findings: No bruising or rash.  Neurological:     General: No focal deficit present.     Mental Status: He is alert and oriented to person, place, and time. Mental status is at baseline.  Psychiatric:        Mood and Affect: Mood normal.        Behavior: Behavior normal.        Thought Content: Thought content normal.       05/17/2020 10:31 AM   Disclaimer: This note was dictated with voice recognition software. Similar sounding words can inadvertently be transcribed and may not be corrected upon review.

## 2020-05-17 NOTE — Assessment & Plan Note (Signed)
Dysphagia symptoms doing well overall.  Continue current medications and let us know for any exacerbations or recurrent symptoms in the future.

## 2020-05-17 NOTE — Assessment & Plan Note (Signed)
Still persistent constipation, admits he does not drink enough water and is not eating enough fiber.  He often forgets to use MiraLAX.  I reinforced the idea of increased water intake and MiraLAX to help have better bowel movements.  He states he will try to remember better.  Continue other medications otherwise and follow-up in 3 months.

## 2020-05-29 ENCOUNTER — Encounter: Payer: Self-pay | Admitting: Dermatology

## 2020-05-29 NOTE — Progress Notes (Signed)
   Follow-Up Visit   Subjective  Alfred Jones is a 69 y.o. male who presents for the following: Skin Problem (spot on nose, noticed a month or two ago. bleeding,).  Spot Location: No Duration:  Quality:  Associated Signs/Symptoms: Modifying Factors:  Severity:  Timing: Context: History of multiple nonmelanoma skin cancers  Objective  Well appearing patient in no apparent distress; mood and affect are within normal limits.  All skin waist up examined.   Assessment & Plan    Telangiectasia of skin Mid Tip of Nose  Recheck as needed recurrence with a probable biopsy.  Nevus Mid Back  Annual skin examination.     I, Lavonna Monarch, MD, have reviewed all documentation for this visit.  The documentation on 05/29/20 for the exam, diagnosis, procedures, and orders are all accurate and complete.

## 2020-05-30 ENCOUNTER — Telehealth: Payer: Self-pay | Admitting: *Deleted

## 2020-05-30 DIAGNOSIS — M48061 Spinal stenosis, lumbar region without neurogenic claudication: Secondary | ICD-10-CM | POA: Diagnosis not present

## 2020-05-30 DIAGNOSIS — M5416 Radiculopathy, lumbar region: Secondary | ICD-10-CM | POA: Diagnosis not present

## 2020-05-30 NOTE — Telephone Encounter (Signed)
Patient needs TCS with propofol with RMR, ASA 2   lmovm

## 2020-05-31 ENCOUNTER — Telehealth: Payer: Self-pay | Admitting: Internal Medicine

## 2020-05-31 NOTE — Telephone Encounter (Signed)
Returning call from yesterday. [phone] or [phone]

## 2020-05-31 NOTE — Telephone Encounter (Signed)
Lmovm for pt 

## 2020-06-01 NOTE — Telephone Encounter (Signed)
Letter mailed

## 2020-06-02 ENCOUNTER — Telehealth: Payer: Self-pay | Admitting: Internal Medicine

## 2020-06-02 NOTE — Telephone Encounter (Signed)
Spoke with pt spouse. TCS with propofol with Dr. Gala Romney scheduled for 9/2 at 12:00pm, and covid test for 9/1 at 9:00am. Aware will mail prep instructions to them. Confirmed mailing address is correct.

## 2020-06-02 NOTE — Telephone Encounter (Signed)
[phone] ELIZABETH PATIENT WIFE CALLED STATING IT WAS OK FOR HIM TO HAVE A TCS AND SHE HAD A FEW QUESTIONS,  SHE WAS SUPPOSED TO SPEAK TO HIS INSURANCE

## 2020-06-06 DIAGNOSIS — D473 Essential (hemorrhagic) thrombocythemia: Secondary | ICD-10-CM | POA: Diagnosis not present

## 2020-06-06 DIAGNOSIS — L4050 Arthropathic psoriasis, unspecified: Secondary | ICD-10-CM | POA: Diagnosis not present

## 2020-06-06 DIAGNOSIS — G629 Polyneuropathy, unspecified: Secondary | ICD-10-CM | POA: Diagnosis not present

## 2020-06-06 DIAGNOSIS — G4733 Obstructive sleep apnea (adult) (pediatric): Secondary | ICD-10-CM | POA: Diagnosis not present

## 2020-06-06 DIAGNOSIS — M4712 Other spondylosis with myelopathy, cervical region: Secondary | ICD-10-CM | POA: Diagnosis not present

## 2020-06-06 DIAGNOSIS — E782 Mixed hyperlipidemia: Secondary | ICD-10-CM | POA: Diagnosis not present

## 2020-06-06 DIAGNOSIS — Z6833 Body mass index (BMI) 33.0-33.9, adult: Secondary | ICD-10-CM | POA: Diagnosis not present

## 2020-06-06 DIAGNOSIS — M2550 Pain in unspecified joint: Secondary | ICD-10-CM | POA: Diagnosis not present

## 2020-06-14 ENCOUNTER — Other Ambulatory Visit: Payer: Self-pay

## 2020-06-14 ENCOUNTER — Encounter (HOSPITAL_COMMUNITY)
Admission: RE | Admit: 2020-06-14 | Discharge: 2020-06-14 | Disposition: A | Discharge status: Home / Self Care | Payer: Medicare Other | Source: Ambulatory Visit | Attending: Internal Medicine | Admitting: Internal Medicine

## 2020-06-22 ENCOUNTER — Other Ambulatory Visit: Payer: Self-pay

## 2020-06-22 ENCOUNTER — Other Ambulatory Visit (HOSPITAL_COMMUNITY)
Admission: RE | Admit: 2020-06-22 | Discharge: 2020-06-22 | Disposition: A | Discharge status: Home / Self Care | Payer: Medicare Other | Source: Ambulatory Visit | Attending: Internal Medicine | Admitting: Internal Medicine

## 2020-06-22 DIAGNOSIS — Z20822 Contact with and (suspected) exposure to covid-19: Secondary | ICD-10-CM | POA: Insufficient documentation

## 2020-06-22 DIAGNOSIS — Z01812 Encounter for preprocedural laboratory examination: Secondary | ICD-10-CM | POA: Insufficient documentation

## 2020-06-22 LAB — SARS CORONAVIRUS 2 (TAT 6-24 HRS): SARS Coronavirus 2: NEGATIVE

## 2020-06-23 ENCOUNTER — Ambulatory Visit (HOSPITAL_COMMUNITY)
Admission: RE | Admit: 2020-06-23 | Discharge: 2020-06-23 | Disposition: A | Discharge status: Home / Self Care | Payer: Medicare Other | Attending: Internal Medicine | Admitting: Internal Medicine

## 2020-06-23 ENCOUNTER — Encounter (HOSPITAL_COMMUNITY)
Admission: RE | Disposition: A | Discharge status: Home / Self Care | Payer: Self-pay | Source: Home / Self Care | Attending: Internal Medicine

## 2020-06-23 ENCOUNTER — Other Ambulatory Visit: Payer: Self-pay

## 2020-06-23 ENCOUNTER — Ambulatory Visit (HOSPITAL_COMMUNITY): Payer: Medicare Other | Admitting: Anesthesiology

## 2020-06-23 ENCOUNTER — Encounter (HOSPITAL_COMMUNITY): Payer: Self-pay | Admitting: Internal Medicine

## 2020-06-23 DIAGNOSIS — Z79899 Other long term (current) drug therapy: Secondary | ICD-10-CM | POA: Diagnosis not present

## 2020-06-23 DIAGNOSIS — Z7982 Long term (current) use of aspirin: Secondary | ICD-10-CM | POA: Diagnosis not present

## 2020-06-23 DIAGNOSIS — Z1211 Encounter for screening for malignant neoplasm of colon: Secondary | ICD-10-CM | POA: Diagnosis not present

## 2020-06-23 DIAGNOSIS — E785 Hyperlipidemia, unspecified: Secondary | ICD-10-CM | POA: Diagnosis not present

## 2020-06-23 DIAGNOSIS — I739 Peripheral vascular disease, unspecified: Secondary | ICD-10-CM | POA: Diagnosis not present

## 2020-06-23 DIAGNOSIS — Q438 Other specified congenital malformations of intestine: Secondary | ICD-10-CM | POA: Diagnosis not present

## 2020-06-23 DIAGNOSIS — F329 Major depressive disorder, single episode, unspecified: Secondary | ICD-10-CM | POA: Diagnosis not present

## 2020-06-23 DIAGNOSIS — K5900 Constipation, unspecified: Secondary | ICD-10-CM | POA: Insufficient documentation

## 2020-06-23 DIAGNOSIS — K635 Polyp of colon: Secondary | ICD-10-CM | POA: Diagnosis not present

## 2020-06-23 DIAGNOSIS — Z87891 Personal history of nicotine dependence: Secondary | ICD-10-CM | POA: Insufficient documentation

## 2020-06-23 DIAGNOSIS — N400 Enlarged prostate without lower urinary tract symptoms: Secondary | ICD-10-CM | POA: Diagnosis not present

## 2020-06-23 DIAGNOSIS — G4730 Sleep apnea, unspecified: Secondary | ICD-10-CM | POA: Insufficient documentation

## 2020-06-23 DIAGNOSIS — Z85828 Personal history of other malignant neoplasm of skin: Secondary | ICD-10-CM | POA: Insufficient documentation

## 2020-06-23 DIAGNOSIS — K921 Melena: Secondary | ICD-10-CM | POA: Diagnosis not present

## 2020-06-23 DIAGNOSIS — K219 Gastro-esophageal reflux disease without esophagitis: Secondary | ICD-10-CM | POA: Diagnosis not present

## 2020-06-23 DIAGNOSIS — F419 Anxiety disorder, unspecified: Secondary | ICD-10-CM | POA: Insufficient documentation

## 2020-06-23 HISTORY — PX: POLYPECTOMY: SHX5525

## 2020-06-23 HISTORY — PX: COLONOSCOPY WITH PROPOFOL: SHX5780

## 2020-06-23 SURGERY — COLONOSCOPY WITH PROPOFOL
Anesthesia: General

## 2020-06-23 MED ORDER — LIDOCAINE HCL (CARDIAC) PF 50 MG/5ML IV SOSY
PREFILLED_SYRINGE | INTRAVENOUS | Status: DC | PRN
  Administered 2020-06-23: 80 mg via INTRAVENOUS

## 2020-06-23 MED ORDER — LACTATED RINGERS IV SOLN: Freq: Once | INTRAVENOUS | Status: AC

## 2020-06-23 MED ORDER — CHLORHEXIDINE GLUCONATE CLOTH 2 % EX PADS: 6.0000 | MEDICATED_PAD | Freq: Once | CUTANEOUS | Status: DC

## 2020-06-23 MED ORDER — PROPOFOL 500 MG/50ML IV EMUL
INTRAVENOUS | Status: DC | PRN
  Administered 2020-06-23: 150 ug/kg/min via INTRAVENOUS
  Administered 2020-06-23: 80 mg via INTRAVENOUS

## 2020-06-23 MED ORDER — LACTATED RINGERS IV SOLN: INTRAVENOUS | Status: DC | PRN

## 2020-06-23 MED ORDER — STERILE WATER FOR IRRIGATION IR SOLN
Status: DC | PRN
  Administered 2020-06-23: 1.5 mL

## 2020-06-23 NOTE — Anesthesia Preprocedure Evaluation (Addendum)
Anesthesia Evaluation  Patient identified by MRN, date of birth, ID band Patient awake    Reviewed: Allergy & Precautions, NPO status , Patient's Chart, lab work & pertinent test results  History of Anesthesia Complications Negative for: history of anesthetic complications  Airway Mallampati: II  TM Distance: >3 FB Neck ROM: Full   Comment: ACDF Dental  (+) Missing, Partial Upper, Dental Advisory Given   Pulmonary sleep apnea and Continuous Positive Airway Pressure Ventilation , former smoker,    breath sounds clear to auscultation       Cardiovascular Exercise Tolerance: Good + Peripheral Vascular Disease  Normal cardiovascular exam Rhythm:Regular Rate:Normal     Neuro/Psych PSYCHIATRIC DISORDERS Anxiety Depression  Neuromuscular disease    GI/Hepatic Neg liver ROS, GERD  Medicated and Controlled,  Endo/Other  negative endocrine ROS  Renal/GU negative Renal ROS     Musculoskeletal  (+) Arthritis  (BACK PAIN, SCIATICA, NECK PAIN), Rheumatoid disorders,    Abdominal   Peds  Hematology negative hematology ROS (+)   Anesthesia Other Findings   Reproductive/Obstetrics                            Anesthesia Physical Anesthesia Plan  ASA: II  Anesthesia Plan: General   Post-op Pain Management:    Induction: Intravenous  PONV Risk Score and Plan: TIVA  Airway Management Planned: Nasal Cannula and Natural Airway  Additional Equipment:   Intra-op Plan:   Post-operative Plan:   Informed Consent: I have reviewed the patients History and Physical, chart, labs and discussed the procedure including the risks, benefits and alternatives for the proposed anesthesia with the patient or authorized representative who has indicated his/her understanding and acceptance.     Dental advisory given  Plan Discussed with: CRNA and Surgeon  Anesthesia Plan Comments:        Anesthesia Quick  Evaluation

## 2020-06-23 NOTE — Anesthesia Postprocedure Evaluation (Signed)
Anesthesia Post Note  Patient: Alfred Jones  Procedure(s) Performed: COLONOSCOPY WITH PROPOFOL (N/A ) POLYPECTOMY  Patient location during evaluation: Endoscopy Anesthesia Type: General Level of consciousness: awake, oriented, awake and alert and patient cooperative Pain management: pain level controlled Vital Signs Assessment: post-procedure vital signs reviewed and stable Respiratory status: spontaneous breathing, respiratory function stable and nonlabored ventilation Cardiovascular status: blood pressure returned to baseline and stable Postop Assessment: no headache and no backache Anesthetic complications: no   No complications documented.   Last Vitals:  Vitals:   06/23/20 1048  BP: 138/72  Pulse: 61  Resp: 14  Temp: 36.6 C  SpO2: 99%    Last Pain:  Vitals:   06/23/20 1101  TempSrc:   PainSc: 0-No pain                 Tacy Learn

## 2020-06-23 NOTE — H&P (Signed)
@LOGO @   Primary Care Physician:  Curlene Labrum, MD Primary Gastroenterologist:  Dr. Gala Romney  Pre-Procedure History & Physical: HPI:  Alfred Jones is a 69 y.o. male here for diagnostic colonoscopy for a paper hematochezia in the setting of chronic constipation.  Negative colonoscopy 2011.  Past Medical History:  Diagnosis Date  . Acid reflux   . Anxiety   . Arthritis    RHEUMATOID OR PSORIATIC ?  Marland Kitchen BCC (basal cell carcinoma of skin) 06/19/2007   left lower chest (CX35FU)  . BCC (basal cell carcinoma of skin) infiltrated 03/11/2018   right nose ([facility])  . BCC (basal cell carcinoma of skin) ulcerated 07/11/2010   left cheek   . Cancer ([place])   . Depression   . Enlarged prostate   . Hyperlipidemia   . Numbness   . Sleep apnea    TESTED BACK IN 2017  . Superficial basal cell carcinoma (BCC) 03/11/2018   left shoulder     Past Surgical History:  Procedure Laterality Date  . ANTERIOR CERVICAL DECOMP/DISCECTOMY FUSION N/A 09/23/2018   Procedure: Cervical Three-Four Cervical Four-Five Cervical Five-Six Anterior cervical decompression/discectomy/fusion;  Surgeon: Erline Levine, MD;  Location: [facility];  Service: Neurosurgery;  Laterality: N/A;  Cervical Three-Four Cervical Four-Five Cervical Five-Six Anterior cervical decompression/discectomy/fusion  . CARDIAC CATHETERIZATION     20 YRS AGO AT [facility]  . ESOPHAGOGASTRODUODENOSCOPY (EGD) WITH PROPOFOL N/A 08/13/2019   Procedure: ESOPHAGOGASTRODUODENOSCOPY (EGD) WITH PROPOFOL;  Surgeon: Daneil Dolin, MD;  [facility];  Service: Endoscopy;  Laterality: N/A;  8:30am  . EYE SURGERY     METAL REMOVED FROM LEFT EYE  (IN OFFICE)  . MALONEY DILATION N/A 08/13/2019   Procedure: Venia Minks DILATION;  Surgeon: Daneil Dolin, MD;  Location[facility];  Service: Endoscopy;  Laterality: N/A;  . TONSILLECTOMY     as a child    Prior to Admission medications   Medication Sig Start Date End Date Taking? Authorizing Provider   alfuzosin (UROXATRAL) 10 MG 24 hr tablet Take 1 tablet (10 mg total) by mouth daily with breakfast. 01/15/20  Yes Irine Seal, MD  aspirin EC 81 MG tablet Take 81 mg by mouth daily.   Yes [provider]  clotrimazole (LOTRIMIN AF) 1 % cream Apply 1 application topically at bedtime. Feet   Yes [provider]  Co-Enzyme Q-10 100 MG CAPS Take 200 mg by mouth every Thursday.    Yes [provider]  diclofenac Sodium (VOLTAREN) 1 % GEL Apply 2 g topically at bedtime. Joint pain   Yes [provider]  Docusate Sodium 100 MG capsule Take 200 mg by mouth 2 (two) times daily.    Yes [provider]  DULoxetine (CYMBALTA) 60 MG capsule Take 60 mg by mouth at bedtime. 05/14/20  Yes [provider]  hydroxyurea (HYDREA) 500 MG capsule Take 500 mg by mouth daily. 06/05/20  Yes [provider]  Melatonin 10 MG CAPS Take 10 mg by mouth at bedtime.    Yes [provider]  methocarbamol (ROBAXIN) 500 MG tablet Take 1 tablet (500 mg total) by mouth every 6 (six) hours as needed for muscle spasms. Patient taking differently: Take 500 mg by mouth at bedtime.  09/24/18  Yes Erline Levine, MD  oxyCODONE 10 MG TABS Take 0.5-1 tablets (5-10 mg total) by mouth every 4 (four) hours as needed for severe pain ((score 7 to 10)). Patient taking differently: Take 10 mg by mouth at bedtime.  09/24/18  Yes Erline Levine, MD  pantoprazole (PROTONIX) 40 MG tablet Take 40 mg by mouth 2 (two) times daily.    Yes [provider]  rosuvastatin (CRESTOR) 5 MG tablet Take 5 mg by mouth every Thursday.    Yes [provider]  traZODone (DESYREL) 50 MG tablet Take 50 mg by mouth at bedtime.  11/30/19  Yes [provider]  acetaminophen (TYLENOL) 500 MG tablet Take 1,000 mg by mouth every 6 (six) hours as needed for mild pain.     [provider]  tadalafil (CIALIS) 5 MG tablet Take 1 tablet (5 mg total) by mouth daily. 01/15/20   Irine Seal, MD    Allergies as of 06/02/2020  . (No Known Allergies)    Family History  Problem Relation Age of Onset  . Heart disease Mother   . Diabetes Mother   . Heart attack Mother   . Heart disease Father   . Diabetes Father   . Colon cancer Neg Hx     Social History   Socioeconomic History  . Marital status: Married    Spouse name: Not on file  . Number of children: 2  . Years of education: 34  . Highest education level: Not on file  Occupational History  . Occupation: Dealer  Tobacco Use  . Smoking status: Former Smoker    Packs/day: 1.00    Years: 16.00    Pack years: 16.00    Types: Cigarettes    Quit date: 10/22/1986    Years since quitting: 33.6  . Smokeless tobacco: Never Used  Vaping Use  . Vaping Use: Never used  Substance and Sexual Activity  . Alcohol use: No  . Drug use: No  . Sexual activity: Not on file  Other Topics Concern  . Not on file  Social History Narrative   Lives at home with his wife and son.   Right-handed.   2-4 cups caffeine per day.   Social Determinants of Health   Financial Resource Strain:   . Difficulty of Paying Living Expenses: Not on file  Food Insecurity:   . Worried About Charity fundraiser in the Last Year: Not on file  . Ran Out of Food in the Last Year: Not on file  Transportation Needs:   . Lack of Transportation (Medical): Not on file  . Lack of Transportation (Non-Medical): Not on file  Physical Activity:   . Days of Exercise per Week: Not on file  . Minutes of Exercise per Session: Not on file  Stress:   . Feeling of Stress : Not on file  Social Connections:   . Frequency of Communication with Friends and Family: Not on file  . Frequency of Social Gatherings with Friends and Family: Not on file  . Attends Religious Services: Not on file  . Active Member of Clubs or Organizations: Not on file  . Attends Archivist Meetings: Not on file  . Marital Status: Not on file  Intimate Partner  Violence:   . Fear of Current or Ex-Partner: Not on file  . Emotionally Abused: Not on file  . Physically Abused: Not on file  . Sexually Abused: Not on file    Review of Systems: See HPI, otherwise negative ROS  Physical Exam: BP 138/72   Pulse 61   Temp 97.9 F (36.6 C) (Oral)   Resp 14   Ht 5\' 11"  (1.803 m)   Wt 98 kg   SpO2 99%  BMI 30.13 kg/m  General:   Alert,  Well-developed, well-nourished, pleasant and cooperative in NAD Mouth:  No deformity or lesions. Neck:  Supple; no masses or thyromegaly. No significant cervical adenopathy. Lungs:  Clear throughout to auscultation.   No wheezes, crackles, or rhonchi. No acute distress. Heart:  Regular rate and rhythm; no murmurs, clicks, rubs,  or gallops. Abdomen: Non-distended, normal bowel sounds.  Soft and nontender without appreciable mass or hepatosplenomegaly.  Pulses:  Normal pulses noted. Extremities:  Without clubbing or edema.  Impression/Plan: 69 year old gentleman with a paper hematochezia the setting of constipation.  Negative colonoscopy 2011.  Colonoscopy today for diagnostic purpose. The risks, benefits, limitations, alternatives and imponderables have been reviewed with the patient. Questions have been answered. All parties are agreeable.      Notice: This dictation was prepared with Dragon dictation along with smaller phrase technology. Any transcriptional errors that result from this process are unintentional and may not be corrected upon review.

## 2020-06-23 NOTE — Op Note (Signed)
[facility] Patient Name: Alfred Jones Procedure Date: 06/23/2020 10:53 AM MRN: [id number] Date of Birth: 15-Jul-1951 Attending MD: Norvel Richards , MD CSN: [id number] Age: 69 Admit Type: Outpatient Procedure:                Colonoscopy Indications:              Screening for colorectal malignant neoplasm Providers:                Norvel Richards, MD, Caprice Kluver, Randa Spike, Technician, Kristine L. Risa Grill,                            Technician Referring MD:              Medicines:                Propofol per Anesthesia Complications:            No immediate complications. Estimated Blood Loss:     Estimated blood loss was minimal. Procedure:                Pre-Anesthesia Assessment:                           - Prior to the procedure, a History and Physical                            was performed, and patient medications and                            allergies were reviewed. The patient's tolerance of                            previous anesthesia was also reviewed. The risks                            and benefits of the procedure and the sedation                            options and risks were discussed with the patient.                            All questions were answered, and informed consent                            was obtained. Prior Anticoagulants: The patient has                            taken no previous anticoagulant or antiplatelet                            agents. ASA Grade Assessment: III - A patient with  severe systemic disease. After reviewing the risks                            and benefits, the patient was deemed in                            satisfactory condition to undergo the procedure.                           After obtaining informed consent, the colonoscope                            was passed under direct vision. Throughout the                            procedure, the  patient's blood pressure, pulse, and                            oxygen saturations were monitored continuously. The                            CF-HQ190L ([id number]) scope was introduced through                            the anus and advanced to the the cecum, identified                            by appendiceal orifice and ileocecal valve. The                            colonoscopy was performed without difficulty. The                            patient tolerated the procedure well. The entire                            colon was well visualized. Scope In: 11:06:31 AM Scope Out: 11:29:01 AM Scope Withdrawal Time: 0 hours 11 minutes 6 seconds  Total Procedure Duration: 0 hours 22 minutes 30 seconds  Findings:      A 3 mm polyp was found in the hepatic flexure. The polyp was sessile.       The polyp was removed with a cold snare. This polyp was removed and       ablated simultaneously. No specimen for the pathologist.. Estimated       blood loss was minimal. Redundant colon requiring sternal abdominal       pressure to reach the cecum. Impression:               - One 1 mm polyp at the hepatic flexure, removed                            with a cold snare. Resected and retrieved. Moderate Sedation:      Moderate (conscious) sedation was administered by the endoscopy nurse       and supervised by  the endoscopist. The following parameters were       monitored: oxygen saturation, heart rate, blood pressure, respiratory       rate, EKG, adequacy of pulmonary ventilation, and response to care. Recommendation:           - Patient has a contact number available for                            emergencies. The signs and symptoms of potential                            delayed complications were discussed with the                            patient. Return to normal activities tomorrow.                            Written discharge instructions were provided to the                            patient.                            - Advance diet as tolerated.                           - Continue present medications.                           - Repeat colonoscopy in 10 years for surveillance                            if overall health permits.                           - Return to GI clinic in 3 months. Procedure Code(s):        --- Professional ---                           [phone], Colonoscopy, flexible; with removal of                            tumor(s), polyp(s), or other lesion(s) by snare                            technique Diagnosis Code(s):        --- Professional ---                           Z12.11, Encounter for screening for malignant                            neoplasm of colon                           K63.5, Polyp of colon CPT copyright 2019 American Medical Association. All rights reserved. The codes documented  in this report are preliminary and upon coder review may  be revised to meet current compliance requirements. Cristopher Estimable. Kaeleigh Westendorf, MD Norvel Richards, MD 06/23/2020 11:44:55 AM This report has been signed electronically. Number of Addenda: 0

## 2020-06-23 NOTE — Discharge Instructions (Signed)
Colonoscopy Discharge Instructions  Read the instructions outlined below and refer to this sheet in the next few weeks. These discharge instructions provide you with general information on caring for yourself after you leave the hospital. Your doctor may also give you specific instructions. While your treatment has been planned according to the most current medical practices available, unavoidable complications occasionally occur. If you have any problems or questions after discharge, call Dr. Gala Romney at [phone]. ACTIVITY  You may resume your regular activity, but move at a slower pace for the next 24 hours.   Take frequent rest periods for the next 24 hours.   Walking will help get rid of the air and reduce the bloated feeling in your belly (abdomen).   No driving for 24 hours (because of the medicine (anesthesia) used during the test).    Do not sign any important legal documents or operate any machinery for 24 hours (because of the anesthesia used during the test).  NUTRITION  Drink plenty of fluids.   You may resume your normal diet as instructed by your doctor.   Begin with a light meal and progress to your normal diet. Heavy or fried foods are harder to digest and may make you feel sick to your stomach (nauseated).   Avoid alcoholic beverages for 24 hours or as instructed.  MEDICATIONS  You may resume your normal medications unless your doctor tells you otherwise.  WHAT YOU CAN EXPECT TODAY  Some feelings of bloating in the abdomen.   Passage of more gas than usual.   Spotting of blood in your stool or on the toilet paper.  IF YOU HAD POLYPS REMOVED DURING THE COLONOSCOPY:  No aspirin products for 7 days or as instructed.   No alcohol for 7 days or as instructed.   Eat a soft diet for the next 24 hours.  FINDING OUT THE RESULTS OF YOUR TEST Not all test results are available during your visit. If your test results are not back during the visit, make an appointment  with your caregiver to find out the results. Do not assume everything is normal if you have not heard from your caregiver or the medical facility. It is important for you to follow up on all of your test results.  SEEK IMMEDIATE MEDICAL ATTENTION IF:  You have more than a spotting of blood in your stool.   Your belly is swollen (abdominal distention).   You are nauseated or vomiting.   You have a temperature over 101.   You have abdominal pain or discomfort that is severe or gets worse throughout the day.   1 small polyp removed and ablated  Recommend repeat colonoscopy in 10 years if overall health permits  Visit with [place] in 3 months  Patient request, I called Benjamine Mola at [phone] message on answering machine.      Colon Polyps  Polyps are tissue growths inside the body. Polyps can grow in many places, including the large intestine (colon). A polyp may be a round bump or a mushroom-shaped growth. You could have one polyp or several. Most colon polyps are noncancerous (benign). However, some colon polyps can become cancerous over time. Finding and removing the polyps early can help prevent this. What are the causes? The exact cause of colon polyps is not known. What increases the risk? You are more likely to develop this condition if you:  Have a family history of colon cancer or colon polyps.  Are older than 50 or older than  62 if you are African American.  Have inflammatory bowel disease, such as ulcerative colitis or Crohn's disease.  Have certain hereditary conditions, such as: ? Familial adenomatous polyposis. ? Lynch syndrome. ? Turcot syndrome. ? Peutz-Jeghers syndrome.  Are overweight.  Smoke cigarettes.  Do not get enough exercise.  Drink too much alcohol.  Eat a diet that is high in fat and red meat and low in fiber.  Had childhood cancer that was treated with abdominal radiation. What are the signs or symptoms? Most polyps do not cause  symptoms. If you have symptoms, they may include:  Blood coming from your rectum when having a bowel movement.  Blood in your stool. The stool may look dark red or black.  Abdominal pain.  A change in bowel habits, such as constipation or diarrhea. How is this diagnosed? This condition is diagnosed with a colonoscopy. This is a procedure in which a lighted, flexible scope is inserted into the anus and then passed into the colon to examine the area. Polyps are sometimes found when a colonoscopy is done as part of routine cancer screening tests. How is this treated? Treatment for this condition involves removing any polyps that are found. Most polyps can be removed during a colonoscopy. Those polyps will then be tested for cancer. Additional treatment may be needed depending on the results of testing. Follow these instructions at home: Lifestyle  Maintain a healthy weight, or lose weight if recommended by your health care provider.  Exercise every day or as told by your health care provider.  Do not use any products that contain nicotine or tobacco, such as cigarettes and e-cigarettes. If you need help quitting, ask your health care provider.  If you drink alcohol, limit how much you have: ? 0-1 drink a day for women. ? 0-2 drinks a day for men.  Be aware of how much alcohol is in your drink. In the [place] one drink equals one 12 oz bottle of beer (355 mL), one 5 oz glass of wine (148 mL), or one 1 oz shot of hard liquor (44 mL). Eating and drinking   Eat foods that are high in fiber, such as fruits, vegetables, and whole grains.  Eat foods that are high in calcium and vitamin D, such as milk, cheese, yogurt, eggs, liver, fish, and broccoli.  Limit foods that are high in fat, such as fried foods and desserts.  Limit the amount of red meat and processed meat you eat, such as hot dogs, sausage, bacon, and lunch meats. General instructions  Keep all follow-up visits as told by your  health care provider. This is important. ? This includes having regularly scheduled colonoscopies. ? Talk to your health care provider about when you need a colonoscopy. Contact a health care provider if:  You have new or worsening bleeding during a bowel movement.  You have new or increased blood in your stool.  You have a change in bowel habits.  You lose weight for no known reason. Summary  Polyps are tissue growths inside the body. Polyps can grow in many places, including the colon.  Most colon polyps are noncancerous (benign), but some can become cancerous over time.  This condition is diagnosed with a colonoscopy.  Treatment for this condition involves removing any polyps that are found. Most polyps can be removed during a colonoscopy. This information is not intended to replace advice given to you by your health care provider. Make sure you discuss any questions you have with  your health care provider. Document Revised: 01/23/2018 Document Reviewed: 01/23/2018 Elsevier Patient Education  [place] After These instructions provide you with information about caring for yourself after your procedure. Your health care provider may also give you more specific instructions. Your treatment has been planned according to current medical practices, but problems sometimes occur. Call your health care provider if you have any problems or questions after your procedure. What can I expect after the procedure? After your procedure, you may:  Feel sleepy for several hours.  Feel clumsy and have poor balance for several hours.  Feel forgetful about what happened after the procedure.  Have poor judgment for several hours.  Feel nauseous or vomit.  Have a sore throat if you had a breathing tube during the procedure. Follow these instructions at home: For at least 24 hours after the procedure:      Have a responsible adult stay with  you. It is important to have someone help care for you until you are awake and alert.  Rest as needed.  Do not: ? Participate in activities in which you could fall or become injured. ? Drive. ? Use heavy machinery. ? Drink alcohol. ? Take sleeping pills or medicines that cause drowsiness. ? Make important decisions or sign legal documents. ? Take care of children on your own. Eating and drinking  Follow the diet that is recommended by your health care provider.  If you vomit, drink water, juice, or soup when you can drink without vomiting.  Make sure you have little or no nausea before eating solid foods. General instructions  Take over-the-counter and prescription medicines only as told by your health care provider.  If you have sleep apnea, surgery and certain medicines can increase your risk for breathing problems. Follow instructions from your health care provider about wearing your sleep device: ? Anytime you are sleeping, including during daytime naps. ? While taking prescription pain medicines, sleeping medicines, or medicines that make you drowsy.  If you smoke, do not smoke without supervision.  Keep all follow-up visits as told by your health care provider. This is important. Contact a health care provider if:  You keep feeling nauseous or you keep vomiting.  You feel light-headed.  You develop a rash.  You have a fever. Get help right away if:  You have trouble breathing. Summary  For several hours after your procedure, you may feel sleepy and have poor judgment.  Have a responsible adult stay with you for at least 24 hours or until you are awake and alert. This information is not intended to replace advice given to you by your health care provider. Make sure you discuss any questions you have with your health care provider. Document Revised: 01/06/2018 Document Reviewed: 01/29/2016 Elsevier Patient Education  [place].

## 2020-06-23 NOTE — Transfer of Care (Signed)
Immediate Anesthesia Transfer of Care Note  Patient: Alfred Jones  Procedure(s) Performed: COLONOSCOPY WITH PROPOFOL (N/A ) POLYPECTOMY  Patient Location: Endoscopy Unit  Anesthesia Type:General  Level of Consciousness: awake, alert , oriented and patient cooperative  Airway & Oxygen Therapy: Patient Spontanous Breathing  Post-op Assessment: Report given to RN, Post -op Vital signs reviewed and stable and Patient moving all extremities  Post vital signs: Reviewed and stable  Last Vitals:  Vitals Value Taken Time  BP    Temp    Pulse    Resp    SpO2      Last Pain:  Vitals:   06/23/20 1101  TempSrc:   PainSc: 0-No pain      Patients Stated Pain Goal: 5 (69/67/89 3810)  Complications: No complications documented.

## 2020-06-28 DIAGNOSIS — M5126 Other intervertebral disc displacement, lumbar region: Secondary | ICD-10-CM | POA: Diagnosis not present

## 2020-06-28 DIAGNOSIS — M47816 Spondylosis without myelopathy or radiculopathy, lumbar region: Secondary | ICD-10-CM | POA: Diagnosis not present

## 2020-06-28 DIAGNOSIS — M48061 Spinal stenosis, lumbar region without neurogenic claudication: Secondary | ICD-10-CM | POA: Diagnosis not present

## 2020-06-29 ENCOUNTER — Encounter (HOSPITAL_COMMUNITY): Payer: Self-pay | Admitting: Internal Medicine

## 2020-07-05 ENCOUNTER — Telehealth: Payer: Self-pay | Admitting: Internal Medicine

## 2020-07-05 DIAGNOSIS — K5900 Constipation, unspecified: Secondary | ICD-10-CM

## 2020-07-05 NOTE — Telephone Encounter (Signed)
Pt hasn't had a BM since his TCS on 06/23/20. Pt had a Ducolax ,emena and Senokot stool softener yesterday and had a small BM last night. Stool was very skinny and pt is concerned. Asked if pt is taking the Mirlax as directed at his last apt. Pts spouse says pt was taking it when asked for 1 week but pt continued to have too many Bm's. Pt is able to pass gas, reports no abdominal pain. Please advise on what pt should do to get his bowels moving.

## 2020-07-05 NOTE — Telephone Encounter (Signed)
Pt's wife called again to see if the provider had gotten back with the nurse about recommendations for patient being constipation for 11 days . She wanted to hear from [place] before we leave today. Please advise. [phone]

## 2020-07-05 NOTE — Telephone Encounter (Signed)
Pt's wife called to say patient had colonoscopy by Dr Gala Romney on 9/2 and he has been constipated the past 11 days. Please advise. [phone]

## 2020-07-05 NOTE — Telephone Encounter (Signed)
Left a detailed message for pt. Message was sent to provider today and we will contact pt when the provider has responded. Pt was advised earlier to take a dose of Miralax as directed from his last ov.

## 2020-07-05 NOTE — Telephone Encounter (Signed)
Since the MiraLAX was effective, but too many stools, let's try doing a half dose once a day and see if that gets things moving but without adverse effects. If this is not working well enough, can try to increase to once daily until things get moving and then adjust down if needed.  I think if MiraLAX was too much, something like Linzess way go way too overboard.  Call if any problems or to let us know how he's doing in 3 days (Friday). If he develops severe abdominal pain, N/V, other worrisome signs can call us back and/or go to the ED if concerned. END OF MESSAGE  Clinical note: unremarkable colonoscopy with post-procedure constipation. Doubt ileus or SBO given he's still passing gas. Will follow-up with patient in a few days. If further concerns, can check abdominal 2-view.

## 2020-07-06 NOTE — Telephone Encounter (Signed)
Spoke with pts spouse. She was notified of recommendations given by Walden Field, NP. Pt will add the half dose of Miralax to see if that will help. They will monitor pts symptoms and call back with an update on Friday.

## 2020-07-11 ENCOUNTER — Other Ambulatory Visit: Payer: Self-pay

## 2020-07-11 ENCOUNTER — Ambulatory Visit (HOSPITAL_COMMUNITY)
Admission: RE | Admit: 2020-07-11 | Discharge: 2020-07-11 | Disposition: A | Discharge status: Home / Self Care | Payer: Medicare Other | Source: Ambulatory Visit | Attending: Gastroenterology | Admitting: Gastroenterology

## 2020-07-11 DIAGNOSIS — K5900 Constipation, unspecified: Secondary | ICD-10-CM | POA: Diagnosis not present

## 2020-07-11 NOTE — Telephone Encounter (Signed)
Patient spouse called back. Reports patient is still not able to have a BM. If he does reports it is "very skinny".

## 2020-07-11 NOTE — Telephone Encounter (Signed)
Please have patient take one capful of Miralax daily. We can go ahead and get a one view abdominal xray. It is a walk in appt. He can do that today. I doubt ileus or obstruction. We will probably have to do some type of purge (like Miralax) to get things moving.

## 2020-07-11 NOTE — Addendum Note (Signed)
Addended by: Cheron Every on: 07/11/2020 02:39 PM   Modules accepted: Orders

## 2020-07-11 NOTE — Telephone Encounter (Signed)
Called spoke with patient spouse. She is aware of Laural Benes. She will call patient at work to see if he can go ahead and have the abd xray done today. If not today then tomorrow. She will also have him do 1 capful of miralax

## 2020-07-11 NOTE — Telephone Encounter (Signed)
Routing to Vian for advice since Randall Hiss is off for the next two days

## 2020-07-15 ENCOUNTER — Ambulatory Visit: Payer: Medicare Other | Admitting: Urology

## 2020-07-27 ENCOUNTER — Telehealth: Payer: Self-pay | Admitting: Internal Medicine

## 2020-07-27 DIAGNOSIS — K5900 Constipation, unspecified: Secondary | ICD-10-CM

## 2020-07-27 NOTE — Telephone Encounter (Signed)
Spoke with pts spouse. Samples of Linzess 290 mcg are ready for pickup.   Pt would also like RX Linzess 290 mcg sent into his pharmacy. Medication is working well.

## 2020-07-27 NOTE — Telephone Encounter (Signed)
See documentation in other phone note.  

## 2020-07-27 NOTE — Telephone Encounter (Signed)
Patient wife called and said that the linzess is working when he takes it,  Does he need to continue it?  If so they would like a prescription sent to [facility] in [place]

## 2020-07-27 NOTE — Telephone Encounter (Signed)
Pt's wife called asking for Alfred Jones. I told her that she was on another call. She wanted nurse to know that patient's Linzess was 224mcg and was asking for samples. Please advise. [phone]

## 2020-07-27 NOTE — Telephone Encounter (Signed)
Lmom, waiting on a return call.  

## 2020-07-28 MED ORDER — LINACLOTIDE 290 MCG PO CAPS: 290.0000 ug | ORAL_CAPSULE | Freq: Every day | ORAL | 3 refills | Status: DC

## 2020-07-28 NOTE — Addendum Note (Signed)
Addended by: Gordy Levan, Rosezella Kronick A on: 07/28/2020 04:05 PM   Modules accepted: Orders

## 2020-07-28 NOTE — Telephone Encounter (Signed)
Rx sent per request. 

## 2020-07-28 NOTE — Telephone Encounter (Signed)
Noted  

## 2020-07-28 NOTE — Progress Notes (Signed)
Subjective:  1. BPH with urinary obstruction   2. Weak urinary stream   3. Erectile dysfunction due to arterial insufficiency      Mr. Vonbehren returns today in f/u for a 6 month f/u for  his history of BPH with BOO and ED.  He was supposed to have a PSA done but I don't have those results.  He was given tadalafil for the BPH and BOO as well as the ED on 06/05/19 and additional sildenafil to use with the tadalafil for the ED has so so results with that.   He remains on alfuzosin at this time. He remains on cymbalta. He has stable LUTS with an  IPSS of 11. He has a reduced stream unless he has a full bladder.  He still has some intermittency. He has occasional hesitancy. He has nocturia x 1. He attempted a flowrate but had insufficient volume. He has no dysuria or hematuria.Marland Kitchen His last PSA was 1.8 in 8/20.  It was 1.7 in 10/19 and 1.02 in 2017. His testosterone level was 450 at last check.   He has come off of oxycodone except for rare use at night. He is also on gabapentin.   He had constipation and had a KUB on 07/11/20.  No stones were seen.    He has a new diagnosis of thrombocytosis and he is on Hydroxyurea.        ROS:  ROS:  A complete review of systems was performed.  All systems are negative except for pertinent findings as noted.   Review of Systems    No Known Allergies  Outpatient Encounter Medications as of 07/29/2020  Medication Sig  . acetaminophen (TYLENOL) 500 MG tablet Take 1,000 mg by mouth every 6 (six) hours as needed for mild pain.   Marland Kitchen alfuzosin (UROXATRAL) 10 MG 24 hr tablet Take 1 tablet (10 mg total) by mouth daily with breakfast.  . aspirin EC 81 MG tablet Take 81 mg by mouth daily.  . clotrimazole (LOTRIMIN AF) 1 % cream Apply 1 application topically at bedtime. Feet  . Co-Enzyme Q-10 100 MG CAPS Take 200 mg by mouth every Thursday.   . diclofenac Sodium (VOLTAREN) 1 % GEL Apply 2 g topically at bedtime. Joint pain  . Docusate Sodium 100 MG capsule Take 200 mg by  mouth 2 (two) times daily.   . DULoxetine (CYMBALTA) 60 MG capsule Take 60 mg by mouth at bedtime.  . hydroxyurea (HYDREA) 500 MG capsule Take 500 mg by mouth daily.  Marland Kitchen linaclotide (LINZESS) 290 MCG CAPS capsule Take 1 capsule (290 mcg total) by mouth daily before breakfast.  . Melatonin 10 MG CAPS Take 10 mg by mouth at bedtime.   . pantoprazole (PROTONIX) 40 MG tablet Take 40 mg by mouth 2 (two) times daily.   . rosuvastatin (CRESTOR) 5 MG tablet Take 5 mg by mouth every Thursday.   . tadalafil (CIALIS) 5 MG tablet Take 1 tablet (5 mg total) by mouth daily.  . traZODone (DESYREL) 50 MG tablet Take 50 mg by mouth at bedtime.   . [DISCONTINUED] alfuzosin (UROXATRAL) 10 MG 24 hr tablet Take 1 tablet (10 mg total) by mouth daily with breakfast.  . [DISCONTINUED] tadalafil (CIALIS) 5 MG tablet Take 1 tablet (5 mg total) by mouth daily.  . celecoxib (CELEBREX) 200 MG capsule Take 200 mg by mouth daily.  . methocarbamol (ROBAXIN) 500 MG tablet Take 1 tablet (500 mg total) by mouth every 6 (six) hours as needed for  muscle spasms. (Patient not taking: Reported on 07/29/2020)  . oxyCODONE 10 MG TABS Take 0.5-1 tablets (5-10 mg total) by mouth every 4 (four) hours as needed for severe pain ((score 7 to 10)). (Patient not taking: Reported on 07/29/2020)  . sildenafil (REVATIO) 20 MG tablet 1-5 po qday prn   No facility-administered encounter medications on file as of 07/29/2020.    Past Medical History:  Diagnosis Date  . Acid reflux   . Anxiety   . Arthritis    RHEUMATOID OR PSORIATIC ?  Marland Kitchen BCC (basal cell carcinoma of skin) 06/19/2007   left lower chest (CX35FU)  . BCC (basal cell carcinoma of skin) infiltrated 03/11/2018   right nose (MOHS)  . BCC (basal cell carcinoma of skin) ulcerated 07/11/2010   left cheek   . Cancer ([place])   . Depression   . Enlarged prostate   . Hyperlipidemia   . Numbness   . Sleep apnea    TESTED BACK IN 2017  . Superficial basal cell carcinoma (BCC) 03/11/2018    left shoulder     Past Surgical History:  Procedure Laterality Date  . ANTERIOR CERVICAL DECOMP/DISCECTOMY FUSION N/A 09/23/2018   Procedure: Cervical Three-Four Cervical Four-Five Cervical Five-Six Anterior cervical decompression/discectomy/fusion;  Surgeon: Erline Levine, MD;  Location: [facility];  Service: Neurosurgery;  Laterality: N/A;  Cervical Three-Four Cervical Four-Five Cervical Five-Six Anterior cervical decompression/discectomy/fusion  . CARDIAC CATHETERIZATION     20 YRS AGO AT [facility]  . COLONOSCOPY WITH PROPOFOL N/A 06/23/2020   Procedure: COLONOSCOPY WITH PROPOFOL;  Surgeon: Daneil Dolin, MD;  [facility];  Service: Endoscopy;  Laterality: N/A;  12:00pm  . ESOPHAGOGASTRODUODENOSCOPY (EGD) WITH PROPOFOL N/A 08/13/2019   Procedure: ESOPHAGOGASTRODUODENOSCOPY (EGD) WITH PROPOFOL;  Surgeon: Daneil Dolin, MD;  [facility];  Service: Endoscopy;  Laterality: N/A;  8:30am  . EYE SURGERY     METAL REMOVED FROM LEFT EYE  (IN OFFICE)  . MALONEY DILATION N/A 08/13/2019   Procedure: Venia Minks DILATION;  Surgeon: Daneil Dolin, MD;  [facility];  Service: Endoscopy;  Laterality: N/A;  . POLYPECTOMY  06/23/2020   Procedure: POLYPECTOMY;  Surgeon: Daneil Dolin, MD;  [facility];  Service: Endoscopy;;  . TONSILLECTOMY     as a child    Social History   Socioeconomic History  . Marital status: Married    Spouse name: Not on file  . Number of children: 2  . Years of education: 68  . Highest education level: Not on file  Occupational History  . Occupation: Dealer  Tobacco Use  . Smoking status: Former Smoker    Packs/day: 1.00    Years: 16.00    Pack years: 16.00    Types: Cigarettes    Quit date: 10/22/1986    Years since quitting: 33.7  . Smokeless tobacco: Never Used  Vaping Use  . Vaping Use: Never used  Substance and Sexual Activity  . Alcohol use: No  . Drug use: No  . Sexual activity: Not on file  Other Topics Concern   . Not on file  Social History Narrative   Lives at home with his wife and son.   Right-handed.   2-4 cups caffeine per day.   Social Determinants of Health   Financial Resource Strain:   . Difficulty of Paying Living Expenses: Not on file  Food Insecurity:   . Worried About Charity fundraiser in the Last Year: Not on file  .  Ran Out of Food in the Last Year: Not on file  Transportation Needs:   . Lack of Transportation (Medical): Not on file  . Lack of Transportation (Non-Medical): Not on file  Physical Activity:   . Days of Exercise per Week: Not on file  . Minutes of Exercise per Session: Not on file  Stress:   . Feeling of Stress : Not on file  Social Connections:   . Frequency of Communication with Friends and Family: Not on file  . Frequency of Social Gatherings with Friends and Family: Not on file  . Attends Religious Services: Not on file  . Active Member of Clubs or Organizations: Not on file  . Attends Archivist Meetings: Not on file  . Marital Status: Not on file  Intimate Partner Violence:   . Fear of Current or Ex-Partner: Not on file  . Emotionally Abused: Not on file  . Physically Abused: Not on file  . Sexually Abused: Not on file    Family History  Problem Relation Age of Onset  . Heart disease Mother   . Diabetes Mother   . Heart attack Mother   . Heart disease Father   . Diabetes Father   . Colon cancer Neg Hx        Objective: Vitals:   07/29/20 1110  BP: (!) 145/78  Pulse: 63  Temp: 98.7 F (37.1 C)     Physical Exam  Lab Results:  Recent Results (from the past 2160 hour(s))  SARS CORONAVIRUS 2 (TAT 6-24 HRS) Nasopharyngeal Nasopharyngeal Swab     Status: None   Collection Time: 06/22/20  9:04 AM   Specimen: Nasopharyngeal Swab  Result Value Ref Range   SARS Coronavirus 2 NEGATIVE NEGATIVE    Comment: (NOTE) SARS-CoV-2 target nucleic acids are NOT DETECTED.  The SARS-CoV-2 RNA is generally detectable in upper and  lower respiratory specimens during the acute phase of infection. Negative results do not preclude SARS-CoV-2 infection, do not rule out co-infections with other pathogens, and should not be used as the sole basis for treatment or other patient management decisions. Negative results must be combined with clinical observations, patient history, and epidemiological information. The expected result is Negative.  Fact Sheet for Patients: SugarRoll.be  Fact Sheet for Healthcare Providers: https://www.woods-mathews.com/  This test is not yet approved or cleared by the [place] FDA and  has been authorized for detection and/or diagnosis of SARS-CoV-2 by FDA under an Emergency Use Authorization (EUA). This EUA will remain  in effect (meaning this test can be used) for the duration of the COVID-19 declaration under Se ction 564(b)(1) of the Act, 21 U.S.C. section 360bbb-3(b)(1), unless the authorization is terminated or revoked sooner.  Performed at [facility], [place] [address], [place], [place] [address]   Urinalysis, Routine w reflex microscopic     Status: Abnormal   Collection Time: 07/29/20 11:09 AM  Result Value Ref Range   Specific Gravity, UA 1.020 1.005 - 1.030   pH, UA 6.5 5.0 - 7.5   Color, UA Amber (A) Yellow   Appearance Ur Clear Clear   Leukocytes,UA Negative Negative   Protein,UA 1+ (A) Negative/Trace   Glucose, UA Negative Negative   Ketones, UA Trace (A) Negative   RBC, UA Negative Negative   Bilirubin, UA Negative Negative   Urobilinogen, Ur 2.0 (H) 0.2 - 1.0 mg/dL   Nitrite, UA Negative Negative   Microscopic Examination See below:   Microscopic Examination  Status: None   Collection Time: 07/29/20 11:09 AM   Urine  Result Value Ref Range   WBC, UA None seen 0 - 5 /hpf   RBC None seen 0 - 2 /hpf   Epithelial Cells (non renal) None seen 0 - 10 /hpf   Renal Epithel, UA None seen None seen /hpf   Mucus, UA  Present Not Estab.   Bacteria, UA None seen None seen/Few     BMET No results for input(s): NA, K, CL, CO2, GLUCOSE, BUN, CREATININE, CALCIUM in the last 72 hours. PSA PSA  Date Value Ref Range Status  12/02/2015 1.12 <=4.00 ng/mL Final    Comment:    Test Methodology: ECLIA PSA (Electrochemiluminescence Immunoassay)   For PSA values from 2.5-4.0, particularly in younger men <80 years old, the AUA and NCCN suggest testing for % Free PSA (3515) and evaluation of the rate of increase in PSA (PSA velocity).    No results found for: TESTOSTERONE  I have reviewed labs from [facility].   Plt count is 607K.   Studies/Results: Results for orders placed or performed in visit on 07/29/20 (from the past 48 hour(s))  Urinalysis, Routine w reflex microscopic     Status: Abnormal   Collection Time: 07/29/20 11:09 AM  Result Value Ref Range   Specific Gravity, UA 1.020 1.005 - 1.030   pH, UA 6.5 5.0 - 7.5   Color, UA Amber (A) Yellow   Appearance Ur Clear Clear   Leukocytes,UA Negative Negative   Protein,UA 1+ (A) Negative/Trace   Glucose, UA Negative Negative   Ketones, UA Trace (A) Negative   RBC, UA Negative Negative   Bilirubin, UA Negative Negative   Urobilinogen, Ur 2.0 (H) 0.2 - 1.0 mg/dL   Nitrite, UA Negative Negative   Microscopic Examination See below:   Microscopic Examination     Status: None   Collection Time: 07/29/20 11:09 AM   Urine  Result Value Ref Range   WBC, UA None seen 0 - 5 /hpf   RBC None seen 0 - 2 /hpf   Epithelial Cells (non renal) None seen 0 - 10 /hpf   Renal Epithel, UA None seen None seen /hpf   Mucus, UA Present Not Estab.   Bacteria, UA None seen None seen/Few       Assessment & Plan: BPH with BOO.  He is doing ok on alfuzosin and tadalafil which I have refilled.  He will return in 6 months with a PSA.   ED.  I refilled the tadalafil and also uses the sildenafil as needed.      Meds ordered this encounter  Medications  . alfuzosin (UROXATRAL)  10 MG 24 hr tablet    Sig: Take 1 tablet (10 mg total) by mouth daily with breakfast.    Dispense:  90 tablet    Refill:  3  . tadalafil (CIALIS) 5 MG tablet    Sig: Take 1 tablet (5 mg total) by mouth daily.    Dispense:  30 tablet    Refill:  11  . sildenafil (REVATIO) 20 MG tablet    Sig: 1-5 po qday prn    Dispense:  90 tablet    Refill:  3     Orders Placed This Encounter  Procedures  . Microscopic Examination  . Urinalysis, Routine w reflex microscopic  . PSA, total and free    Standing Status:   Future    Standing Expiration Date:   07/29/2021  Return in about 6 months (around 01/27/2021) for With PSA.   CC: Burdine, Virgina Evener, MD      Irine Seal 07/29/2020

## 2020-07-29 ENCOUNTER — Ambulatory Visit (INDEPENDENT_AMBULATORY_CARE_PROVIDER_SITE_OTHER): Payer: Medicare Other | Admitting: Urology

## 2020-07-29 ENCOUNTER — Encounter: Payer: Self-pay | Admitting: Urology

## 2020-07-29 ENCOUNTER — Other Ambulatory Visit: Payer: Self-pay

## 2020-07-29 VITALS — BP 145/78 | HR 63 | Temp 98.7°F | Ht 71.0 in | Wt 216.0 lb

## 2020-07-29 DIAGNOSIS — N401 Enlarged prostate with lower urinary tract symptoms: Secondary | ICD-10-CM | POA: Diagnosis not present

## 2020-07-29 DIAGNOSIS — E559 Vitamin D deficiency, unspecified: Secondary | ICD-10-CM | POA: Diagnosis not present

## 2020-07-29 DIAGNOSIS — N5201 Erectile dysfunction due to arterial insufficiency: Secondary | ICD-10-CM

## 2020-07-29 DIAGNOSIS — N138 Other obstructive and reflux uropathy: Secondary | ICD-10-CM | POA: Diagnosis not present

## 2020-07-29 DIAGNOSIS — D75839 Thrombocytosis, unspecified: Secondary | ICD-10-CM | POA: Diagnosis not present

## 2020-07-29 DIAGNOSIS — R3912 Poor urinary stream: Secondary | ICD-10-CM | POA: Diagnosis not present

## 2020-07-29 DIAGNOSIS — Z09 Encounter for follow-up examination after completed treatment for conditions other than malignant neoplasm: Secondary | ICD-10-CM | POA: Diagnosis not present

## 2020-07-29 DIAGNOSIS — M5441 Lumbago with sciatica, right side: Secondary | ICD-10-CM | POA: Diagnosis not present

## 2020-07-29 DIAGNOSIS — G8929 Other chronic pain: Secondary | ICD-10-CM | POA: Diagnosis not present

## 2020-07-29 DIAGNOSIS — D473 Essential (hemorrhagic) thrombocythemia: Secondary | ICD-10-CM | POA: Diagnosis not present

## 2020-07-29 LAB — URINALYSIS, ROUTINE W REFLEX MICROSCOPIC
Bilirubin, UA: NEGATIVE
Glucose, UA: NEGATIVE
Leukocytes,UA: NEGATIVE
Nitrite, UA: NEGATIVE
RBC, UA: NEGATIVE
Specific Gravity, UA: 1.02 (ref 1.005–1.030)
Urobilinogen, Ur: 2 mg/dL — ABNORMAL HIGH (ref 0.2–1.0)
pH, UA: 6.5 (ref 5.0–7.5)

## 2020-07-29 LAB — MICROSCOPIC EXAMINATION
Bacteria, UA: NONE SEEN
Epithelial Cells (non renal): NONE SEEN /hpf (ref 0–10)
RBC, Urine: NONE SEEN /hpf (ref 0–2)
Renal Epithel, UA: NONE SEEN /hpf
WBC, UA: NONE SEEN /hpf (ref 0–5)

## 2020-07-29 MED ORDER — SILDENAFIL CITRATE 20 MG PO TABS: ORAL_TABLET | ORAL | 3 refills | Status: DC

## 2020-07-29 MED ORDER — TADALAFIL 5 MG PO TABS: 5.0000 mg | ORAL_TABLET | Freq: Every day | ORAL | 11 refills | Status: DC

## 2020-07-29 MED ORDER — ALFUZOSIN HCL ER 10 MG PO TB24: 10.0000 mg | ORAL_TABLET | Freq: Every day | ORAL | 3 refills | Status: DC

## 2020-07-29 NOTE — Progress Notes (Signed)
Urological Symptom Review  Patient is experiencing the following symptoms: Stream starts and stops Trouble starting stream Weak stream   Review of Systems  Gastrointestinal (upper)  : Negative for upper GI symptoms  Gastrointestinal (lower) : Constipation  Constitutional : Fatigue  Skin: Itching  Eyes: Negative for eye symptoms  Ear/Nose/Throat : Negative for Ear/Nose/Throat symptoms  Hematologic/Lymphatic: Negative for Hematologic/Lymphatic symptoms  Cardiovascular : Leg swelling  Respiratory : Negative for respiratory symptoms  Endocrine: Negative for endocrine symptoms  Musculoskeletal: Negative for musculoskeletal symptoms  Neurological: Negative for neurological symptoms  Psychologic: Negative for psychiatric symptoms

## 2020-08-05 DIAGNOSIS — I7381 Erythromelalgia: Secondary | ICD-10-CM | POA: Diagnosis not present

## 2020-08-05 DIAGNOSIS — R079 Chest pain, unspecified: Secondary | ICD-10-CM | POA: Diagnosis not present

## 2020-08-05 DIAGNOSIS — D473 Essential (hemorrhagic) thrombocythemia: Secondary | ICD-10-CM | POA: Diagnosis not present

## 2020-08-05 DIAGNOSIS — Z09 Encounter for follow-up examination after completed treatment for conditions other than malignant neoplasm: Secondary | ICD-10-CM | POA: Diagnosis not present

## 2020-08-05 DIAGNOSIS — Z7189 Other specified counseling: Secondary | ICD-10-CM | POA: Diagnosis not present

## 2020-08-05 DIAGNOSIS — E785 Hyperlipidemia, unspecified: Secondary | ICD-10-CM | POA: Diagnosis not present

## 2020-08-09 DIAGNOSIS — K5909 Other constipation: Secondary | ICD-10-CM | POA: Diagnosis not present

## 2020-08-09 DIAGNOSIS — R0789 Other chest pain: Secondary | ICD-10-CM | POA: Diagnosis not present

## 2020-08-09 DIAGNOSIS — L608 Other nail disorders: Secondary | ICD-10-CM | POA: Diagnosis not present

## 2020-08-09 DIAGNOSIS — Z6833 Body mass index (BMI) 33.0-33.9, adult: Secondary | ICD-10-CM | POA: Diagnosis not present

## 2020-08-09 DIAGNOSIS — M48061 Spinal stenosis, lumbar region without neurogenic claudication: Secondary | ICD-10-CM | POA: Diagnosis not present

## 2020-08-09 DIAGNOSIS — B351 Tinea unguium: Secondary | ICD-10-CM | POA: Diagnosis not present

## 2020-08-09 DIAGNOSIS — R030 Elevated blood-pressure reading, without diagnosis of hypertension: Secondary | ICD-10-CM | POA: Diagnosis not present

## 2020-08-16 ENCOUNTER — Ambulatory Visit: Payer: Medicare Other | Admitting: Nurse Practitioner

## 2020-08-26 DIAGNOSIS — R5383 Other fatigue: Secondary | ICD-10-CM | POA: Diagnosis not present

## 2020-08-26 DIAGNOSIS — K219 Gastro-esophageal reflux disease without esophagitis: Secondary | ICD-10-CM | POA: Diagnosis not present

## 2020-08-26 DIAGNOSIS — Z1329 Encounter for screening for other suspected endocrine disorder: Secondary | ICD-10-CM | POA: Diagnosis not present

## 2020-08-26 DIAGNOSIS — E559 Vitamin D deficiency, unspecified: Secondary | ICD-10-CM | POA: Diagnosis not present

## 2020-08-26 DIAGNOSIS — E782 Mixed hyperlipidemia: Secondary | ICD-10-CM | POA: Diagnosis not present

## 2020-08-31 DIAGNOSIS — I7381 Erythromelalgia: Secondary | ICD-10-CM | POA: Diagnosis not present

## 2020-08-31 DIAGNOSIS — Z6833 Body mass index (BMI) 33.0-33.9, adult: Secondary | ICD-10-CM | POA: Diagnosis not present

## 2020-08-31 DIAGNOSIS — M2550 Pain in unspecified joint: Secondary | ICD-10-CM | POA: Diagnosis not present

## 2020-08-31 DIAGNOSIS — F5104 Psychophysiologic insomnia: Secondary | ICD-10-CM | POA: Diagnosis not present

## 2020-08-31 DIAGNOSIS — Z23 Encounter for immunization: Secondary | ICD-10-CM | POA: Diagnosis not present

## 2020-08-31 DIAGNOSIS — M4712 Other spondylosis with myelopathy, cervical region: Secondary | ICD-10-CM | POA: Diagnosis not present

## 2020-08-31 DIAGNOSIS — E782 Mixed hyperlipidemia: Secondary | ICD-10-CM | POA: Diagnosis not present

## 2020-08-31 DIAGNOSIS — D473 Essential (hemorrhagic) thrombocythemia: Secondary | ICD-10-CM | POA: Diagnosis not present

## 2020-09-06 DIAGNOSIS — M5416 Radiculopathy, lumbar region: Secondary | ICD-10-CM | POA: Diagnosis not present

## 2020-09-06 DIAGNOSIS — M47816 Spondylosis without myelopathy or radiculopathy, lumbar region: Secondary | ICD-10-CM | POA: Diagnosis not present

## 2020-09-06 DIAGNOSIS — M48061 Spinal stenosis, lumbar region without neurogenic claudication: Secondary | ICD-10-CM | POA: Diagnosis not present

## 2020-09-08 DIAGNOSIS — D75839 Thrombocytosis, unspecified: Secondary | ICD-10-CM | POA: Diagnosis not present

## 2020-09-08 DIAGNOSIS — D473 Essential (hemorrhagic) thrombocythemia: Secondary | ICD-10-CM | POA: Diagnosis not present

## 2020-09-12 DIAGNOSIS — R079 Chest pain, unspecified: Secondary | ICD-10-CM | POA: Diagnosis not present

## 2020-09-12 DIAGNOSIS — Z7982 Long term (current) use of aspirin: Secondary | ICD-10-CM | POA: Diagnosis not present

## 2020-09-12 DIAGNOSIS — Z09 Encounter for follow-up examination after completed treatment for conditions other than malignant neoplasm: Secondary | ICD-10-CM | POA: Diagnosis not present

## 2020-09-12 DIAGNOSIS — D75839 Thrombocytosis, unspecified: Secondary | ICD-10-CM | POA: Diagnosis not present

## 2020-09-12 DIAGNOSIS — Z6832 Body mass index (BMI) 32.0-32.9, adult: Secondary | ICD-10-CM | POA: Diagnosis not present

## 2020-09-12 DIAGNOSIS — I7381 Erythromelalgia: Secondary | ICD-10-CM | POA: Diagnosis not present

## 2020-09-12 DIAGNOSIS — Z7189 Other specified counseling: Secondary | ICD-10-CM | POA: Diagnosis not present

## 2020-09-12 DIAGNOSIS — D473 Essential (hemorrhagic) thrombocythemia: Secondary | ICD-10-CM | POA: Diagnosis not present

## 2020-10-05 ENCOUNTER — Ambulatory Visit (INDEPENDENT_AMBULATORY_CARE_PROVIDER_SITE_OTHER): Payer: Medicare Other | Admitting: Nurse Practitioner

## 2020-10-05 ENCOUNTER — Encounter: Payer: Self-pay | Admitting: Nurse Practitioner

## 2020-10-05 ENCOUNTER — Other Ambulatory Visit: Payer: Self-pay

## 2020-10-05 VITALS — BP 131/81 | HR 72 | Temp 96.9°F | Ht 69.0 in | Wt 230.6 lb

## 2020-10-05 DIAGNOSIS — K5900 Constipation, unspecified: Secondary | ICD-10-CM

## 2020-10-05 DIAGNOSIS — E559 Vitamin D deficiency, unspecified: Secondary | ICD-10-CM | POA: Diagnosis not present

## 2020-10-05 DIAGNOSIS — D473 Essential (hemorrhagic) thrombocythemia: Secondary | ICD-10-CM | POA: Diagnosis not present

## 2020-10-05 DIAGNOSIS — R1319 Other dysphagia: Secondary | ICD-10-CM

## 2020-10-05 DIAGNOSIS — Z09 Encounter for follow-up examination after completed treatment for conditions other than malignant neoplasm: Secondary | ICD-10-CM | POA: Diagnosis not present

## 2020-10-05 DIAGNOSIS — K219 Gastro-esophageal reflux disease without esophagitis: Secondary | ICD-10-CM

## 2020-10-05 DIAGNOSIS — D75839 Thrombocytosis, unspecified: Secondary | ICD-10-CM | POA: Diagnosis not present

## 2020-10-05 DIAGNOSIS — I7381 Erythromelalgia: Secondary | ICD-10-CM | POA: Diagnosis not present

## 2020-10-05 NOTE — Progress Notes (Signed)
Referring Provider: Curlene Labrum, MD Primary Care Physician:  Curlene Labrum, MD Primary GI:  Dr. Gala Romney  Chief Complaint  Patient presents with   Constipation    A little trouble swallowing    HPI:   Alfred Jones is a 69 y.o. male who presents for follow-up on constipation and dysphagia.  The patient was last seen in our office 05/17/2020 for dysphagia, constipation, hematochezia.  Noted history of GERD and constipation as well as dysphagia.  Previous EGDs with narrowed distal esophagus and dilations.  Colonoscopy up-to-date 2011 with recommended 10-year repeat (2021).  EGD updated 08/13/2019 due to persistent dysphagia which found prominent Schatzki's ring status post dilation with overlying erosive reflux esophagitis, small hiatal hernia, otherwise normal.  Recommend increase Protonix to 40 mg twice daily.  He had subsequent improvement in his symptoms.  Previous constipation taking two stool softeners in the morning and evening.  Recommended an updated regimen previously.  At his last visit again noted persistent improvement in dysphagia, ongoing constipation using MiraLAX intermittently but often forgets.  Still not drinking adequate water and states "all this is my fault, I know."  Some abdominal discomfort with constipation, rare scant hematochezia particularly if constipated.  No other overt GI complaints.  Recommended increase water with ultimate goal 64 ounces a day, use MiraLAX daily, schedule colonoscopy, follow-up in 3 months.  Colonoscopy completed 07/01/2020 which found a single 1 mm polyp at hepatic flexure status post removal.  Surgical pathology lab report was unable to be located.  Recommended repeat colonoscopy in 10 years if overall health permits.  Today states doing okay overall.  Dysphagia symptoms generally doing okay, only has symptoms depending on what he eats and how well he chews.  His primary complaint today is constipation.  He states he knows he is not  drinking enough water still. He has RA and when wakes he has a lot of nausea. When that finally resolves it's time to leave the house for work. He works in a paint/body shop and we discussed keeping refillable bottle of water to remind himself to drink water. Has a bowel movement as infrequently as every 2-3 days. Linzess caused diarrhea, had to use it prn. He states it does work well for him, just can't take it every day. Denies abdominal pain, N/V, hematochezia, melena, fever, chills, unintentional weight loss. Denies URI or flu-like symptoms. Denies loss of sense of taste or smell. Denies chest pain, dyspnea, dizziness, lightheadedness, syncope, near syncope. Denies any other upper or lower GI symptoms.  GERD doing well on Protonix 40 mg bid.  Past Medical History:  Diagnosis Date   Acid reflux    Anxiety    Arthritis    RHEUMATOID OR PSORIATIC ?   BCC (basal cell carcinoma of skin) 06/19/2007   left lower chest (CX35FU)   BCC (basal cell carcinoma of skin) infiltrated 03/11/2018   right nose (MOHS)   BCC (basal cell carcinoma of skin) ulcerated 07/11/2010   left cheek    Cancer ([place])    Depression    Enlarged prostate    Hyperlipidemia    Numbness    Sleep apnea    TESTED BACK IN 2017   Superficial basal cell carcinoma (BCC) 03/11/2018   left shoulder     Past Surgical History:  Procedure Laterality Date   ANTERIOR CERVICAL DECOMP/DISCECTOMY FUSION N/A 09/23/2018   Procedure: Cervical Three-Four Cervical Four-Five Cervical Five-Six Anterior cervical decompression/discectomy/fusion;  Surgeon: Erline Levine, MD;  Location: [facility]  OR;  Service: Neurosurgery;  Laterality: N/A;  Cervical Three-Four Cervical Four-Five Cervical Five-Six Anterior cervical decompression/discectomy/fusion   CARDIAC CATHETERIZATION     20 YRS AGO AT [facility]   COLONOSCOPY WITH PROPOFOL N/A 06/23/2020   Procedure: COLONOSCOPY WITH PROPOFOL;  Surgeon: Daneil Dolin, MD;  Location: [facility];   Service: Endoscopy;  Laterality: N/A;  12:00pm   ESOPHAGOGASTRODUODENOSCOPY (EGD) WITH PROPOFOL N/A 08/13/2019   Procedure: ESOPHAGOGASTRODUODENOSCOPY (EGD) WITH PROPOFOL;  Surgeon: Daneil Dolin, MD;  Location: [facility];  Service: Endoscopy;  Laterality: N/A;  8:30am   EYE SURGERY     METAL REMOVED FROM LEFT EYE  (IN OFFICE)   MALONEY DILATION N/A 08/13/2019   Procedure: Venia Minks DILATION;  Surgeon: Daneil Dolin, MD;  Location: [facility];  Service: Endoscopy;  Laterality: N/A;   POLYPECTOMY  06/23/2020   Procedure: POLYPECTOMY;  Surgeon: Daneil Dolin, MD;  Location: [facility];  Service: Endoscopy;;   TONSILLECTOMY     as a child    Current Outpatient Medications  Medication Sig Dispense Refill   acetaminophen (TYLENOL) 500 MG tablet Take 1,000 mg by mouth as needed for mild pain.     alfuzosin (UROXATRAL) 10 MG 24 hr tablet Take 1 tablet (10 mg total) by mouth daily with breakfast. 90 tablet 3   aspirin EC 81 MG tablet Take 81 mg by mouth daily.     celecoxib (CELEBREX) 200 MG capsule Take 200 mg by mouth as needed.     clotrimazole (LOTRIMIN) 1 % cream Apply 1 application topically at bedtime. Feet     Co-Enzyme Q-10 100 MG CAPS Take 200 mg by mouth daily.     diclofenac Sodium (VOLTAREN) 1 % GEL Apply 2 g topically at bedtime. Joint pain     Docusate Sodium 100 MG capsule Take 200 mg by mouth as needed.     DULoxetine (CYMBALTA) 60 MG capsule Take 60 mg by mouth at bedtime.     hydroxyurea (HYDREA) 500 MG capsule Take 500 mg by mouth daily. Takes 2 tablets daily.     linaclotide (LINZESS) 290 MCG CAPS capsule Take 1 capsule (290 mcg total) by mouth daily before breakfast. 90 capsule 3   Melatonin 10 MG CAPS Take 10 mg by mouth at bedtime.      methocarbamol (ROBAXIN) 500 MG tablet Take 1 tablet (500 mg total) by mouth every 6 (six) hours as needed for muscle spasms. (Patient taking differently: Take 500 mg by mouth as needed for muscle spasms.) 60  tablet 1   Multiple Vitamins-Minerals (MULTIVITAMIN MEN 50+ PO) Take by mouth daily.     oxyCODONE 10 MG TABS Take 0.5-1 tablets (5-10 mg total) by mouth every 4 (four) hours as needed for severe pain ((score 7 to 10)). (Patient taking differently: Take 5-10 mg by mouth 2 (two) times daily.) 60 tablet 0   pantoprazole (PROTONIX) 40 MG tablet Take 40 mg by mouth 2 (two) times daily.      rosuvastatin (CRESTOR) 5 MG tablet Take 5 mg by mouth 2 (two) times a week. Takes on Sunday and Wednesday.     sildenafil (REVATIO) 20 MG tablet 1-5 po qday prn (Patient taking differently: as needed. 1-5 po qday prn) 90 tablet 3   tadalafil (CIALIS) 5 MG tablet Take 1 tablet (5 mg total) by mouth daily. 30 tablet 11   traZODone (DESYREL) 50 MG tablet Take 50 mg by mouth at bedtime.      No current facility-administered  medications for this visit.    Allergies as of 10/05/2020   (No Known Allergies)    Family History  Problem Relation Age of Onset   Heart disease Mother    Diabetes Mother    Heart attack Mother    Heart disease Father    Diabetes Father    Colon cancer Neg Hx     Social History   Socioeconomic History   Marital status: Married    Spouse name: Not on file   Number of children: 2   Years of education: 12   Highest education level: Not on file  Occupational History   Occupation: Dealer  Tobacco Use   Smoking status: Former Smoker    Packs/day: 1.00    Years: 16.00    Pack years: 16.00    Types: Cigarettes    Quit date: 10/22/1986    Years since quitting: 33.9   Smokeless tobacco: Never Used  Vaping Use   Vaping Use: Never used  Substance and Sexual Activity   Alcohol use: No   Drug use: No   Sexual activity: Not on file  Other Topics Concern   Not on file  Social History Narrative   Lives at home with his wife and son.   Right-handed.   2-4 cups caffeine per day.   Social Determinants of Health   Financial Resource Strain: Not on file   Food Insecurity: Not on file  Transportation Needs: Not on file  Physical Activity: Not on file  Stress: Not on file  Social Connections: Not on file    Subjective: Review of Systems  Constitutional: Negative for chills, fever, malaise/fatigue and weight loss.  HENT: Negative for congestion and sore throat.   Respiratory: Negative for cough and shortness of breath.   Cardiovascular: Negative for chest pain and palpitations.  Gastrointestinal: Positive for constipation. Negative for abdominal pain, blood in stool, diarrhea, heartburn, melena, nausea and vomiting.  Musculoskeletal: Negative for joint pain and myalgias.  Skin: Negative for rash.  Neurological: Negative for dizziness and weakness.  Endo/Heme/Allergies: Does not bruise/bleed easily.  Psychiatric/Behavioral: Negative for depression. The patient is not nervous/anxious.   All other systems reviewed and are negative.    Objective: BP 131/81    Pulse 72    Temp (!) 96.9 F (36.1 C) (Temporal)    Ht 5\' 9"  (1.753 m)    Wt 230 lb 9.6 oz (104.6 kg)    BMI 34.05 kg/m  Physical Exam Vitals and nursing note reviewed.  Constitutional:      General: He is not in acute distress.    Appearance: Normal appearance. He is obese. He is not ill-appearing, toxic-appearing or diaphoretic.  HENT:     Head: Normocephalic and atraumatic.     Nose: No congestion or rhinorrhea.  Eyes:     General: No scleral icterus. Cardiovascular:     Rate and Rhythm: Normal rate and regular rhythm.     Heart sounds: Normal heart sounds.  Pulmonary:     Effort: Pulmonary effort is normal.     Breath sounds: Normal breath sounds.  Abdominal:     General: Bowel sounds are normal. There is no distension.     Palpations: Abdomen is soft. There is no hepatomegaly, splenomegaly or mass.     Tenderness: There is no abdominal tenderness. There is no guarding or rebound.     Hernia: No hernia is present.  Musculoskeletal:     Cervical back: Neck supple.   Skin:  General: Skin is warm and dry.     Coloration: Skin is not jaundiced.     Findings: No bruising or rash.  Neurological:     General: No focal deficit present.     Mental Status: He is alert and oriented to person, place, and time. Mental status is at baseline.  Psychiatric:        Mood and Affect: Mood normal.        Behavior: Behavior normal.        Thought Content: Thought content normal.      Assessment:  Very pleasant 69 year old male presents for follow-up on dysphagia, GERD, constipation.  Overall he is doing well.  Persistent constipation but states this is likely mostly his fault.  No red flag/warning signs or symptoms.  Colonoscopy up-to-date this year.  Constipation: Adequately managed on Linzess as needed.  Admits he does not drink enough water.  We have again discussed ways to promote adequate water intake.  If he is able to increase his water he may be able to cut back on his dose of Linzess and/or stop it altogether.  Notify us of any worsening symptoms.  GERD with periodic dysphagia: Chronic history of GERD well managed on PPI.  He has had recurrent dysphagia in the past with esophageal dilations.  Currently only has symptoms if he does not chew adequately.  In general he is satisfied with his current status is with dysphagia   Plan: 1. Continue current medications 2. Increase water intake with a goal of 64 ounces a day 3. Follow-up in 6 months    Thank you for allowing [place] to participate in the care of Bonney Roussel, DNP, AGNP-C Adult & Gerontological Nurse Practitioner [facility]   10/05/2020 9:48 AM   Disclaimer: This note was dictated with voice recognition software. Similar sounding words can inadvertently be transcribed and may not be corrected upon review.

## 2020-10-05 NOTE — Patient Instructions (Signed)
Your health issues we discussed today were:   Constipation: 1. Continue taking Linzess as you have been 2. Try to increase your water intake with a goal of 64 ounces a day 3. You can keep a reusable water bottle at your workplace to help remind you to drink throughout the day 4. Call us for any worsening or severe symptoms  GERD (reflux/heartburn) with dysphagia (swallowing difficulties): 1. I am glad you are reflux is doing well 2. I am glad you are not having any further swallowing difficulties 3. Try to ensure you chew your food adequately to help prevent swallowing issues 4. Call us if you have any worsening or severe symptoms 5. Continue taking Protonix twice a day  Overall I recommend:  1. Continue your other current medications 2. Return for follow-up in 6 months 3. Call us for any questions or concerns   ---------------------------------------------------------------  I am glad you have gotten your COVID-19 vaccination!  Even though you are fully vaccinated you should continue to follow CDC and state/local guidelines.  ---------------------------------------------------------------   At [facility] we value your feedback. You may receive a survey about your visit today. Please share your experience as we strive to create trusting relationships with our patients to provide genuine, compassionate, quality care.  We appreciate your understanding and patience as we review any laboratory studies, imaging, and other diagnostic tests that are ordered as we care for you. Our office policy is 5 business days for review of these results, and any emergent or urgent results are addressed in a timely manner for your best interest. If you do not hear from our office in 1 week, please contact us.   We also encourage the use of MyChart, which contains your medical information for your review as well. If you are not enrolled in this feature, an access code is on this after  visit summary for your convenience. Thank you for allowing [place] to be involved in your care.  It was great to see you today!  I hope you have a Merry Christmas and Happy Holidays!!

## 2020-10-06 NOTE — Progress Notes (Signed)
Cc'ed to pcp °

## 2020-10-11 DIAGNOSIS — M47816 Spondylosis without myelopathy or radiculopathy, lumbar region: Secondary | ICD-10-CM | POA: Diagnosis not present

## 2020-10-12 ENCOUNTER — Other Ambulatory Visit: Payer: Self-pay

## 2020-10-12 ENCOUNTER — Encounter: Payer: Self-pay | Admitting: Dermatology

## 2020-10-12 ENCOUNTER — Ambulatory Visit (INDEPENDENT_AMBULATORY_CARE_PROVIDER_SITE_OTHER): Payer: Medicare Other | Admitting: Dermatology

## 2020-10-12 DIAGNOSIS — Z1283 Encounter for screening for malignant neoplasm of skin: Secondary | ICD-10-CM

## 2020-10-12 DIAGNOSIS — Z09 Encounter for follow-up examination after completed treatment for conditions other than malignant neoplasm: Secondary | ICD-10-CM | POA: Diagnosis not present

## 2020-10-12 DIAGNOSIS — I7381 Erythromelalgia: Secondary | ICD-10-CM | POA: Diagnosis not present

## 2020-10-12 DIAGNOSIS — D473 Essential (hemorrhagic) thrombocythemia: Secondary | ICD-10-CM | POA: Diagnosis not present

## 2020-10-12 DIAGNOSIS — Z7982 Long term (current) use of aspirin: Secondary | ICD-10-CM | POA: Diagnosis not present

## 2020-10-12 DIAGNOSIS — L608 Other nail disorders: Secondary | ICD-10-CM

## 2020-10-12 DIAGNOSIS — L821 Other seborrheic keratosis: Secondary | ICD-10-CM | POA: Diagnosis not present

## 2020-10-15 ENCOUNTER — Encounter: Payer: Self-pay | Admitting: Dermatology

## 2020-10-15 NOTE — Progress Notes (Signed)
   Follow-Up Visit   Subjective  Alfred Jones is a 69 y.o. male who presents for the following: Nail Problem (X months right index finger dark spot,left temple crust was bleeding now healed.).  Dark area under nail right index finger Location:  Duration: At least 1 year Quality:  Associated Signs/Symptoms: Modifying Factors:  Severity:  Timing: Context: History of spot on left temple at blood, but now neither he nor I can feel or see the spot.  Objective  Well appearing patient in no apparent distress; mood and affect are within normal limits. Objective  Right Distal 2nd Finger: Monochrome 2 mm longitudinal melanonychia.  With dermoscopy this is parallel with even light pigment.  Negative Hutchinson sign.  Images    Objective  Mid Back: Waist up skin examination- no atypical moles or non mole skin cancer  Objective  Left Upper Back: Brown 7 mm textured flat topped papules   All skin waist up examined.   Assessment & Plan    Longitudinal melanonychia Right Distal 2nd Finger  If there is a change in the color, shape, or width of this pigmented band, or pigmentation extends to the skin below the nail, we will refer to hand surgeon for biopsy.  Encounter for screening for malignant neoplasm of skin Mid Back  Annual skin examination, encouraged to examine his own skin twice yearly.  Seborrheic keratosis Left Upper Back  Okay to leave if stable     I, Lavonna Monarch, MD, have reviewed all documentation for this visit.  The documentation on 10/15/20 for the exam, diagnosis, procedures, and orders are all accurate and complete.

## 2020-10-18 IMAGING — DX DG ABDOMEN 1V
2 series · 2 of 2 positions shown · non-contrast
Comparison: X-ray abdomen 04/18/2018, CT abdomen pelvis 06/03/2012

CLINICAL DATA: Constipation

EXAM:
ABDOMEN - 1 VIEW

[abdomen kub (1 of 2)]
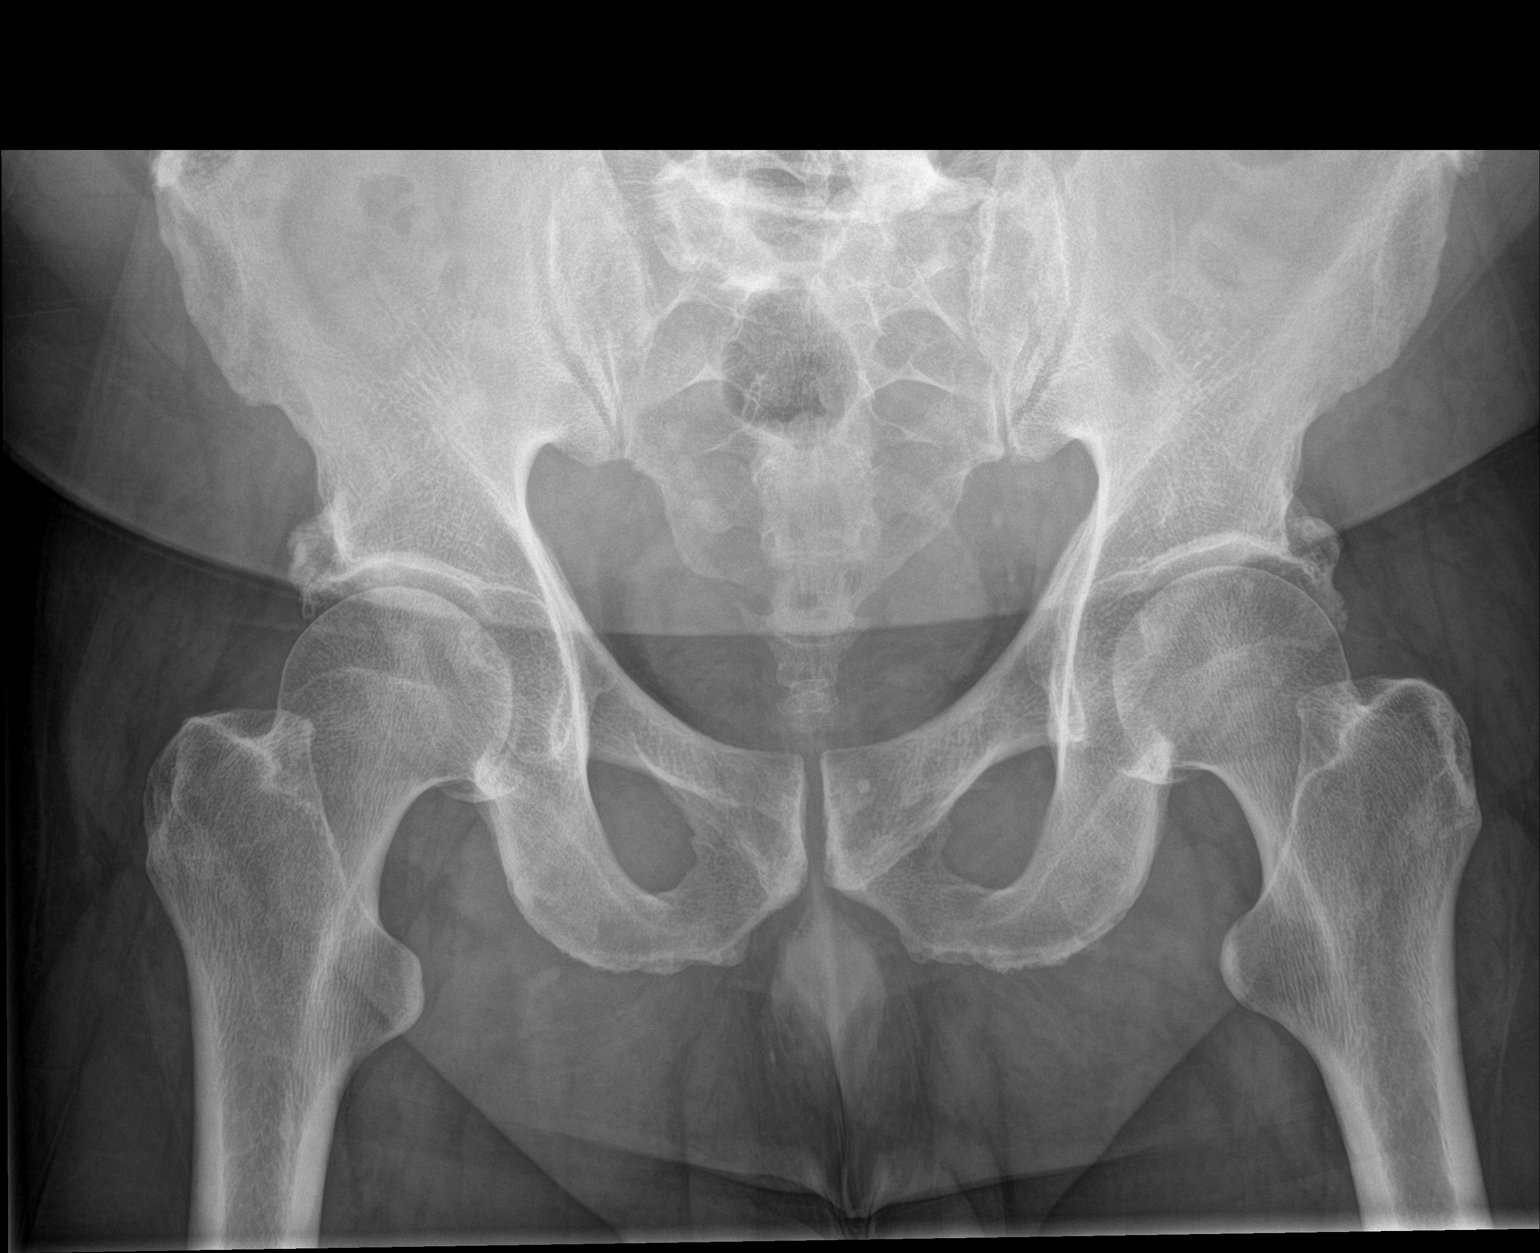

[abdomen kub (2 of 2)]
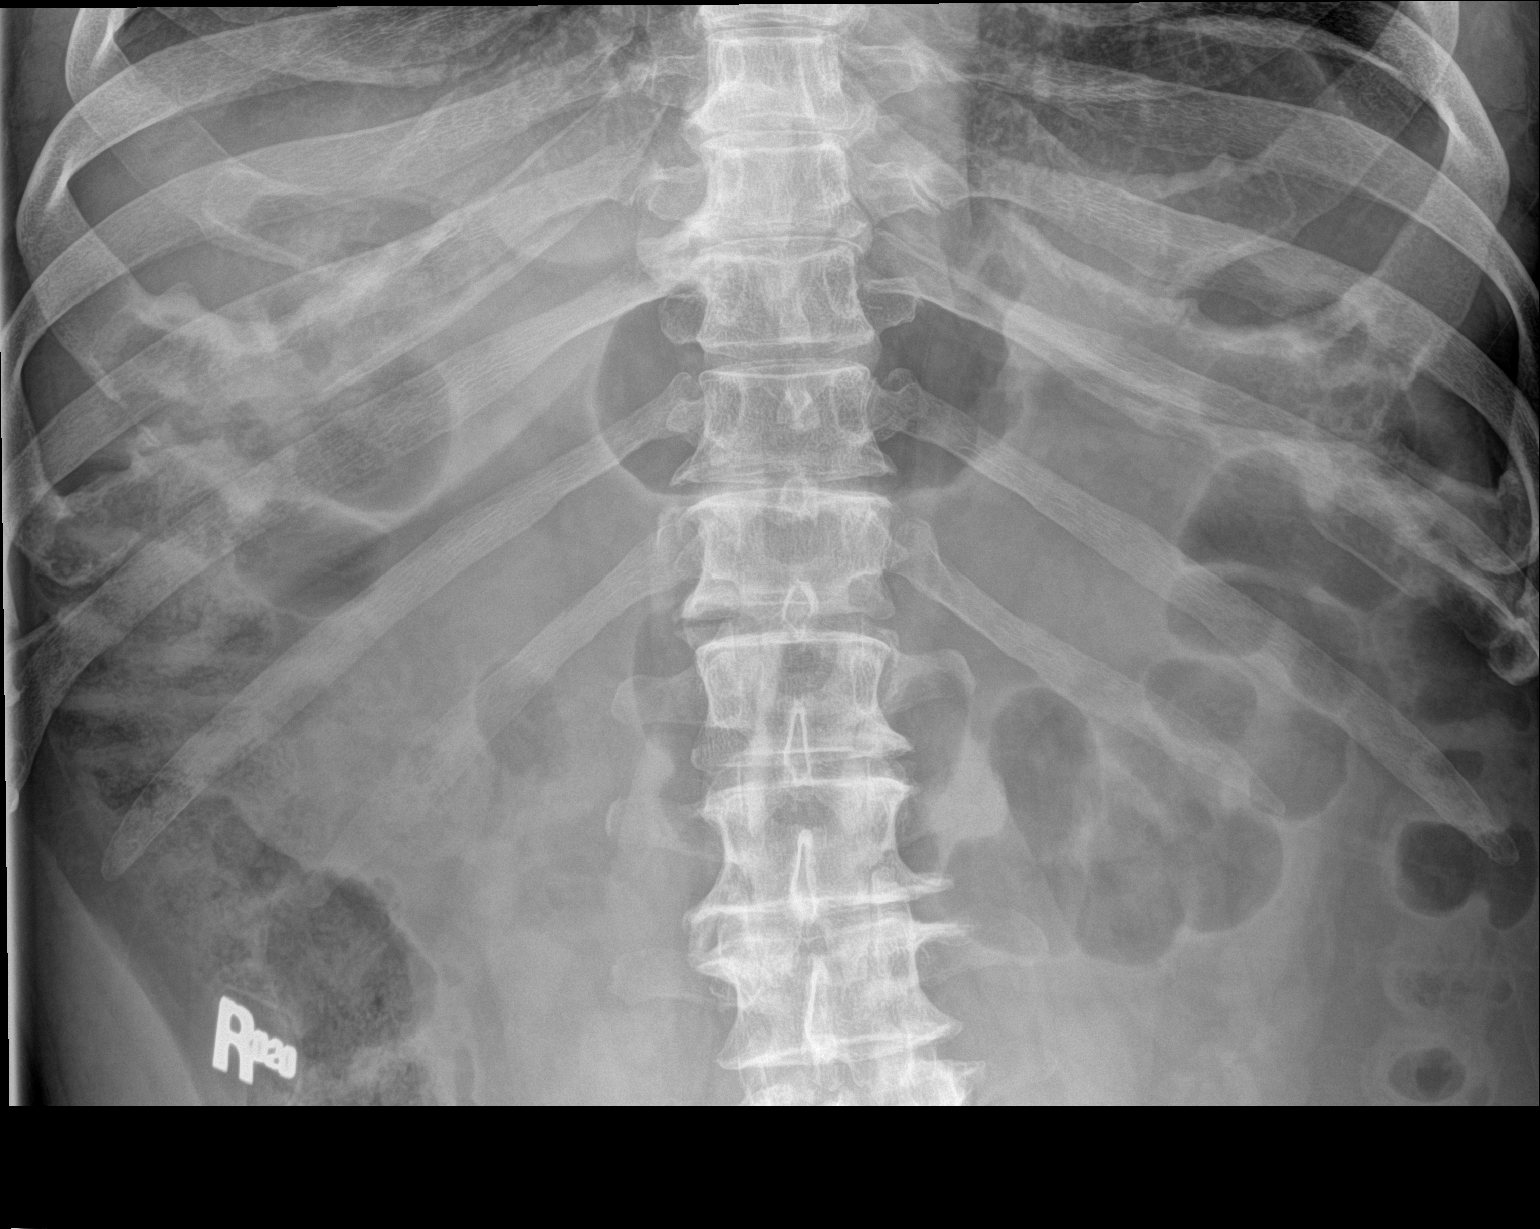

[2 of 2 positions shown; findings below may reference images not displayed]

FINDINGS: The bowel gas pattern is normal. Stool within the ascending and
proximal transverse colon. No radio-opaque calculi or other
significant radiographic abnormality are seen. Visualized osseous
structures are grossly unremarkable.
IMPRESSION: Nonobstructive bowel gas pattern with stool within the ascending
colon proximal transverse colon.

## 2020-11-01 DIAGNOSIS — M47816 Spondylosis without myelopathy or radiculopathy, lumbar region: Secondary | ICD-10-CM | POA: Diagnosis not present

## 2020-11-01 DIAGNOSIS — M5126 Other intervertebral disc displacement, lumbar region: Secondary | ICD-10-CM | POA: Diagnosis not present

## 2020-11-01 DIAGNOSIS — M48061 Spinal stenosis, lumbar region without neurogenic claudication: Secondary | ICD-10-CM | POA: Diagnosis not present

## 2020-11-22 DIAGNOSIS — E782 Mixed hyperlipidemia: Secondary | ICD-10-CM | POA: Diagnosis not present

## 2020-11-22 DIAGNOSIS — E7849 Other hyperlipidemia: Secondary | ICD-10-CM | POA: Diagnosis not present

## 2020-11-22 DIAGNOSIS — D649 Anemia, unspecified: Secondary | ICD-10-CM | POA: Diagnosis not present

## 2020-11-22 DIAGNOSIS — E559 Vitamin D deficiency, unspecified: Secondary | ICD-10-CM | POA: Diagnosis not present

## 2020-11-22 DIAGNOSIS — K219 Gastro-esophageal reflux disease without esophagitis: Secondary | ICD-10-CM | POA: Diagnosis not present
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
## 2020-11-28 DIAGNOSIS — E7849 Other hyperlipidemia: Secondary | ICD-10-CM | POA: Diagnosis not present

## 2020-11-28 DIAGNOSIS — M4712 Other spondylosis with myelopathy, cervical region: Secondary | ICD-10-CM | POA: Diagnosis not present

## 2020-11-28 DIAGNOSIS — G629 Polyneuropathy, unspecified: Secondary | ICD-10-CM | POA: Diagnosis not present

## 2020-11-28 DIAGNOSIS — I7381 Erythromelalgia: Secondary | ICD-10-CM | POA: Diagnosis not present

## 2020-11-28 DIAGNOSIS — Z23 Encounter for immunization: Secondary | ICD-10-CM | POA: Diagnosis not present

## 2020-11-28 DIAGNOSIS — M2550 Pain in unspecified joint: Secondary | ICD-10-CM | POA: Diagnosis not present

## 2020-11-28 DIAGNOSIS — D473 Essential (hemorrhagic) thrombocythemia: Secondary | ICD-10-CM | POA: Diagnosis not present

## 2020-11-28 DIAGNOSIS — L4050 Arthropathic psoriasis, unspecified: Secondary | ICD-10-CM | POA: Diagnosis not present

## 2020-12-08 ENCOUNTER — Ambulatory Visit (INDEPENDENT_AMBULATORY_CARE_PROVIDER_SITE_OTHER): Payer: Medicare Other | Admitting: Rheumatology

## 2020-12-08 ENCOUNTER — Encounter: Payer: Self-pay | Admitting: Rheumatology

## 2020-12-08 ENCOUNTER — Ambulatory Visit: Payer: Self-pay

## 2020-12-08 ENCOUNTER — Other Ambulatory Visit: Payer: Self-pay

## 2020-12-08 VITALS — BP 131/70 | HR 67 | Resp 17 | Ht 68.0 in | Wt 231.6 lb

## 2020-12-08 DIAGNOSIS — E559 Vitamin D deficiency, unspecified: Secondary | ICD-10-CM

## 2020-12-08 DIAGNOSIS — G8929 Other chronic pain: Secondary | ICD-10-CM | POA: Diagnosis not present

## 2020-12-08 DIAGNOSIS — D473 Essential (hemorrhagic) thrombocythemia: Secondary | ICD-10-CM

## 2020-12-08 DIAGNOSIS — M1990 Unspecified osteoarthritis, unspecified site: Secondary | ICD-10-CM | POA: Diagnosis not present

## 2020-12-08 DIAGNOSIS — M51369 Other intervertebral disc degeneration, lumbar region without mention of lumbar back pain or lower extremity pain: Secondary | ICD-10-CM

## 2020-12-08 DIAGNOSIS — M5136 Other intervertebral disc degeneration, lumbar region: Secondary | ICD-10-CM

## 2020-12-08 DIAGNOSIS — M4802 Spinal stenosis, cervical region: Secondary | ICD-10-CM

## 2020-12-08 DIAGNOSIS — Z840 Family history of diseases of the skin and subcutaneous tissue: Secondary | ICD-10-CM

## 2020-12-08 DIAGNOSIS — Z8639 Personal history of other endocrine, nutritional and metabolic disease: Secondary | ICD-10-CM

## 2020-12-08 DIAGNOSIS — D0439 Carcinoma in situ of skin of other parts of face: Secondary | ICD-10-CM

## 2020-12-08 DIAGNOSIS — R21 Rash and other nonspecific skin eruption: Secondary | ICD-10-CM | POA: Diagnosis not present

## 2020-12-08 DIAGNOSIS — M25562 Pain in left knee: Secondary | ICD-10-CM

## 2020-12-08 DIAGNOSIS — M79672 Pain in left foot: Secondary | ICD-10-CM | POA: Diagnosis not present

## 2020-12-08 DIAGNOSIS — M79671 Pain in right foot: Secondary | ICD-10-CM | POA: Diagnosis not present

## 2020-12-08 DIAGNOSIS — M79641 Pain in right hand: Secondary | ICD-10-CM | POA: Diagnosis not present

## 2020-12-08 DIAGNOSIS — M5030 Other cervical disc degeneration, unspecified cervical region: Secondary | ICD-10-CM

## 2020-12-08 DIAGNOSIS — M48061 Spinal stenosis, lumbar region without neurogenic claudication: Secondary | ICD-10-CM

## 2020-12-08 DIAGNOSIS — M79642 Pain in left hand: Secondary | ICD-10-CM | POA: Diagnosis not present

## 2020-12-08 DIAGNOSIS — M25561 Pain in right knee: Secondary | ICD-10-CM | POA: Diagnosis not present

## 2020-12-08 DIAGNOSIS — R5383 Other fatigue: Secondary | ICD-10-CM

## 2020-12-08 DIAGNOSIS — L409 Psoriasis, unspecified: Secondary | ICD-10-CM

## 2020-12-08 DIAGNOSIS — G589 Mononeuropathy, unspecified: Secondary | ICD-10-CM

## 2020-12-08 DIAGNOSIS — Z79899 Other long term (current) drug therapy: Secondary | ICD-10-CM | POA: Diagnosis not present

## 2020-12-08 DIAGNOSIS — Z8719 Personal history of other diseases of the digestive system: Secondary | ICD-10-CM

## 2020-12-08 DIAGNOSIS — I7381 Erythromelalgia: Secondary | ICD-10-CM

## 2020-12-09 NOTE — Progress Notes (Signed)
Hemoglobin is low and MCV is high.  Please add B12 and folate level.  I will discuss results at the follow-up visit.

## 2020-12-14 LAB — CBC WITH DIFFERENTIAL/PLATELET
Absolute Monocytes: 756 cells/uL (ref 200–950)
Basophils Absolute: 62 cells/uL (ref 0–200)
Basophils Relative: 1.1 %
Eosinophils Absolute: 129 cells/uL (ref 15–500)
Eosinophils Relative: 2.3 %
HCT: 35 % — ABNORMAL LOW (ref 38.5–50.0)
Hemoglobin: 12.5 g/dL — ABNORMAL LOW (ref 13.2–17.1)
Lymphs Abs: 2038 cells/uL (ref 850–3900)
MCH: 39.8 pg — ABNORMAL HIGH (ref 27.0–33.0)
MCHC: 35.7 g/dL (ref 32.0–36.0)
MCV: 111.5 fL — ABNORMAL HIGH (ref 80.0–100.0)
MPV: 9.2 fL (ref 7.5–12.5)
Monocytes Relative: 13.5 %
Neutro Abs: 2615 cells/uL (ref 1500–7800)
Neutrophils Relative %: 46.7 %
Platelets: 272 10*3/uL (ref 140–400)
RBC: 3.14 10*6/uL — ABNORMAL LOW (ref 4.20–5.80)
RDW: 14.4 % (ref 11.0–15.0)
Total Lymphocyte: 36.4 %
WBC: 5.6 10*3/uL (ref 3.8–10.8)

## 2020-12-14 LAB — CK: Total CK: 129 U/L (ref 44–196)

## 2020-12-14 LAB — COMPLETE METABOLIC PANEL WITH GFR
AG Ratio: 1.9 (calc) (ref 1.0–2.5)
ALT: 15 U/L (ref 9–46)
AST: 17 U/L (ref 10–35)
Albumin: 4.2 g/dL (ref 3.6–5.1)
Alkaline phosphatase (APISO): 55 U/L (ref 35–144)
BUN: 18 mg/dL (ref 7–25)
CO2: 30 mmol/L (ref 20–32)
Calcium: 8.6 mg/dL (ref 8.6–10.3)
Chloride: 106 mmol/L (ref 98–110)
Creat: 1.13 mg/dL (ref 0.70–1.25)
GFR, Est African American: 78 mL/min/{1.73_m2} (ref >=60)
GFR, Est Non African American: 67 mL/min/{1.73_m2} (ref >=60)
Globulin: 2.2 g/dL (calc) (ref 1.9–3.7)
Glucose, Bld: 72 mg/dL (ref 65–99)
Potassium: 4.2 mmol/L (ref 3.5–5.3)
Sodium: 142 mmol/L (ref 135–146)
Total Bilirubin: 0.6 mg/dL (ref 0.2–1.2)
Total Protein: 6.4 g/dL (ref 6.1–8.1)

## 2020-12-14 LAB — RHEUMATOID FACTOR: Rheumatoid fact SerPl-aCnc: <14 IU/mL (ref <14)

## 2020-12-14 LAB — URIC ACID: Uric Acid, Serum: 7.2 mg/dL (ref 4.0–8.0)

## 2020-12-14 LAB — TEST AUTHORIZATION

## 2020-12-14 LAB — SEDIMENTATION RATE: Sed Rate: 6 mm/h (ref 0–20)

## 2020-12-14 LAB — B12 AND FOLATE PANEL
Folate: 13.3 ng/mL
Vitamin B-12: 306 pg/mL (ref 200–1100)

## 2020-12-14 LAB — 14-3-3 ETA PROTEIN: 14-3-3 eta Protein: <0.2 ng/mL (ref <0.2)

## 2020-12-14 LAB — TSH: TSH: 4.18 mIU/L (ref 0.40–4.50)

## 2020-12-14 LAB — CYCLIC CITRUL PEPTIDE ANTIBODY, IGG: Cyclic Citrullin Peptide Ab: <16 UNITS
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
## 2021-01-05 ENCOUNTER — Ambulatory Visit (INDEPENDENT_AMBULATORY_CARE_PROVIDER_SITE_OTHER): Payer: Medicare Other | Admitting: Rheumatology

## 2021-01-05 ENCOUNTER — Encounter: Payer: Self-pay | Admitting: Rheumatology

## 2021-01-05 ENCOUNTER — Other Ambulatory Visit: Payer: Self-pay

## 2021-01-05 VITALS — BP 143/80 | HR 68 | Resp 16 | Ht 70.0 in | Wt 230.0 lb

## 2021-01-05 DIAGNOSIS — Z840 Family history of diseases of the skin and subcutaneous tissue: Secondary | ICD-10-CM

## 2021-01-05 DIAGNOSIS — M170 Bilateral primary osteoarthritis of knee: Secondary | ICD-10-CM | POA: Diagnosis not present

## 2021-01-05 DIAGNOSIS — Z79899 Other long term (current) drug therapy: Secondary | ICD-10-CM | POA: Diagnosis not present

## 2021-01-05 DIAGNOSIS — D473 Essential (hemorrhagic) thrombocythemia: Secondary | ICD-10-CM

## 2021-01-05 DIAGNOSIS — R21 Rash and other nonspecific skin eruption: Secondary | ICD-10-CM | POA: Diagnosis not present

## 2021-01-05 DIAGNOSIS — E559 Vitamin D deficiency, unspecified: Secondary | ICD-10-CM

## 2021-01-05 DIAGNOSIS — M79642 Pain in left hand: Secondary | ICD-10-CM

## 2021-01-05 DIAGNOSIS — M19041 Primary osteoarthritis, right hand: Secondary | ICD-10-CM

## 2021-01-05 DIAGNOSIS — D0439 Carcinoma in situ of skin of other parts of face: Secondary | ICD-10-CM

## 2021-01-05 DIAGNOSIS — M19071 Primary osteoarthritis, right ankle and foot: Secondary | ICD-10-CM

## 2021-01-05 DIAGNOSIS — I7381 Erythromelalgia: Secondary | ICD-10-CM

## 2021-01-05 DIAGNOSIS — M5030 Other cervical disc degeneration, unspecified cervical region: Secondary | ICD-10-CM | POA: Diagnosis not present

## 2021-01-05 DIAGNOSIS — Z8719 Personal history of other diseases of the digestive system: Secondary | ICD-10-CM

## 2021-01-05 DIAGNOSIS — M5136 Other intervertebral disc degeneration, lumbar region: Secondary | ICD-10-CM | POA: Diagnosis not present

## 2021-01-05 DIAGNOSIS — D75839 Thrombocytosis, unspecified: Secondary | ICD-10-CM | POA: Diagnosis not present

## 2021-01-05 DIAGNOSIS — M19072 Primary osteoarthritis, left ankle and foot: Secondary | ICD-10-CM

## 2021-01-05 DIAGNOSIS — M19042 Primary osteoarthritis, left hand: Secondary | ICD-10-CM

## 2021-01-05 DIAGNOSIS — G589 Mononeuropathy, unspecified: Secondary | ICD-10-CM

## 2021-01-05 DIAGNOSIS — M51369 Other intervertebral disc degeneration, lumbar region without mention of lumbar back pain or lower extremity pain: Secondary | ICD-10-CM

## 2021-01-05 DIAGNOSIS — Z8639 Personal history of other endocrine, nutritional and metabolic disease: Secondary | ICD-10-CM

## 2021-01-05 DIAGNOSIS — M79641 Pain in right hand: Secondary | ICD-10-CM | POA: Diagnosis not present

## 2021-01-05 DIAGNOSIS — Z09 Encounter for follow-up examination after completed treatment for conditions other than malignant neoplasm: Secondary | ICD-10-CM | POA: Diagnosis not present

## 2021-01-05 NOTE — Patient Instructions (Signed)
Hand Exercises Hand exercises can be helpful for almost anyone. These exercises can strengthen the hands, improve flexibility and movement, and increase blood flow to the hands. These results can make work and daily tasks easier. Hand exercises can be especially helpful for people who have joint pain from arthritis or have nerve damage from overuse (carpal tunnel syndrome). These exercises can also help people who have injured a hand. Exercises Most of these hand exercises are gentle stretching and motion exercises. It is usually safe to do them often throughout the day. Warming up your hands before exercise may help to reduce stiffness. You can do this with gentle massage or by placing your hands in warm water for 10-15 minutes. It is normal to feel some stretching, pulling, tightness, or mild discomfort as you begin new exercises. This will gradually improve. Stop an exercise right away if you feel sudden, severe pain or your pain gets worse. Ask your health care provider which exercises are best for you. Knuckle bend or "claw" fist 1. Stand or sit with your arm, hand, and all five fingers pointed straight up. Make sure to keep your wrist straight during the exercise. 2. Gently bend your fingers down toward your palm until the tips of your fingers are touching the top of your palm. Keep your big knuckle straight and just bend the small knuckles in your fingers. 3. Hold this position for __________ seconds. 4. Straighten (extend) your fingers back to the starting position. Repeat this exercise 5-10 times with each hand. Full finger fist 1. Stand or sit with your arm, hand, and all five fingers pointed straight up. Make sure to keep your wrist straight during the exercise. 2. Gently bend your fingers into your palm until the tips of your fingers are touching the middle of your palm. 3. Hold this position for __________ seconds. 4. Extend your fingers back to the starting position, stretching every  joint fully. Repeat this exercise 5-10 times with each hand. Straight fist 1. Stand or sit with your arm, hand, and all five fingers pointed straight up. Make sure to keep your wrist straight during the exercise. 2. Gently bend your fingers at the big knuckle, where your fingers meet your hand, and the middle knuckle. Keep the knuckle at the tips of your fingers straight and try to touch the bottom of your palm. 3. Hold this position for __________ seconds. 4. Extend your fingers back to the starting position, stretching every joint fully. Repeat this exercise 5-10 times with each hand. Tabletop 1. Stand or sit with your arm, hand, and all five fingers pointed straight up. Make sure to keep your wrist straight during the exercise. 2. Gently bend your fingers at the big knuckle, where your fingers meet your hand, as far down as you can while keeping the small knuckles in your fingers straight. Think of forming a tabletop with your fingers. 3. Hold this position for __________ seconds. 4. Extend your fingers back to the starting position, stretching every joint fully. Repeat this exercise 5-10 times with each hand. Finger spread 1. Place your hand flat on a table with your palm facing down. Make sure your wrist stays straight as you do this exercise. 2. Spread your fingers and thumb apart from each other as far as you can until you feel a gentle stretch. Hold this position for __________ seconds. 3. Bring your fingers and thumb tight together again. Hold this position for __________ seconds. Repeat this exercise 5-10 times with each hand.   Making circles 1. Stand or sit with your arm, hand, and all five fingers pointed straight up. Make sure to keep your wrist straight during the exercise. 2. Make a circle by touching the tip of your thumb to the tip of your index finger. 3. Hold for __________ seconds. Then open your hand wide. 4. Repeat this motion with your thumb and each finger on your  hand. Repeat this exercise 5-10 times with each hand. Thumb motion 1. Sit with your forearm resting on a table and your wrist straight. Your thumb should be facing up toward the ceiling. Keep your fingers relaxed as you move your thumb. 2. Lift your thumb up as high as you can toward the ceiling. Hold for __________ seconds. 3. Bend your thumb across your palm as far as you can, reaching the tip of your thumb for the small finger (pinkie) side of your palm. Hold for __________ seconds. Repeat this exercise 5-10 times with each hand. Grip strengthening 1. Hold a stress ball or other soft ball in the middle of your hand. 2. Slowly increase the pressure, squeezing the ball as much as you can without causing pain. Think of bringing the tips of your fingers into the middle of your palm. All of your finger joints should bend when doing this exercise. 3. Hold your squeeze for __________ seconds, then relax. Repeat this exercise 5-10 times with each hand.   Contact a health care provider if:  Your hand pain or discomfort gets much worse when you do an exercise.  Your hand pain or discomfort does not improve within 2 hours after you exercise. If you have any of these problems, stop doing these exercises right away. Do not do them again unless your health care provider says that you can. Get help right away if:  You develop sudden, severe hand pain or swelling. If this happens, stop doing these exercises right away. Do not do them again unless your health care provider says that you can. This information is not intended to replace advice given to you by your health care provider. Make sure you discuss any questions you have with your health care provider. Document Revised: 01/29/2019 Document Reviewed: 10/09/2018 Elsevier Patient Education  2021 Elsevier Inc.  

## 2021-01-10 DIAGNOSIS — M47816 Spondylosis without myelopathy or radiculopathy, lumbar region: Secondary | ICD-10-CM | POA: Diagnosis not present

## 2021-01-11 ENCOUNTER — Ambulatory Visit: Payer: Self-pay

## 2021-01-11 ENCOUNTER — Other Ambulatory Visit: Payer: Self-pay

## 2021-01-11 ENCOUNTER — Ambulatory Visit (INDEPENDENT_AMBULATORY_CARE_PROVIDER_SITE_OTHER): Payer: Medicare Other | Admitting: Rheumatology

## 2021-01-11 DIAGNOSIS — M19042 Primary osteoarthritis, left hand: Secondary | ICD-10-CM | POA: Diagnosis not present

## 2021-01-11 DIAGNOSIS — M79672 Pain in left foot: Secondary | ICD-10-CM

## 2021-01-11 DIAGNOSIS — M19041 Primary osteoarthritis, right hand: Secondary | ICD-10-CM | POA: Diagnosis not present

## 2021-01-11 DIAGNOSIS — M79642 Pain in left hand: Secondary | ICD-10-CM | POA: Diagnosis not present

## 2021-01-11 DIAGNOSIS — M19071 Primary osteoarthritis, right ankle and foot: Secondary | ICD-10-CM

## 2021-01-11 DIAGNOSIS — M79671 Pain in right foot: Secondary | ICD-10-CM

## 2021-01-11 DIAGNOSIS — M79641 Pain in right hand: Secondary | ICD-10-CM

## 2021-01-11 DIAGNOSIS — M19072 Primary osteoarthritis, left ankle and foot: Secondary | ICD-10-CM

## 2021-01-17 ENCOUNTER — Ambulatory Visit (INDEPENDENT_AMBULATORY_CARE_PROVIDER_SITE_OTHER): Payer: Medicare Other

## 2021-01-17 ENCOUNTER — Encounter: Payer: Self-pay | Admitting: Podiatry

## 2021-01-17 ENCOUNTER — Ambulatory Visit (INDEPENDENT_AMBULATORY_CARE_PROVIDER_SITE_OTHER): Payer: Medicare Other | Admitting: Podiatry

## 2021-01-17 ENCOUNTER — Other Ambulatory Visit: Payer: Self-pay

## 2021-01-17 DIAGNOSIS — M79671 Pain in right foot: Secondary | ICD-10-CM

## 2021-01-17 DIAGNOSIS — M5441 Lumbago with sciatica, right side: Secondary | ICD-10-CM

## 2021-01-17 DIAGNOSIS — M48061 Spinal stenosis, lumbar region without neurogenic claudication: Secondary | ICD-10-CM

## 2021-01-17 DIAGNOSIS — R202 Paresthesia of skin: Secondary | ICD-10-CM | POA: Diagnosis not present

## 2021-01-17 DIAGNOSIS — M79672 Pain in left foot: Secondary | ICD-10-CM

## 2021-01-17 DIAGNOSIS — G8929 Other chronic pain: Secondary | ICD-10-CM

## 2021-01-17 DIAGNOSIS — G609 Hereditary and idiopathic neuropathy, unspecified: Secondary | ICD-10-CM | POA: Diagnosis not present

## 2021-01-17 NOTE — Progress Notes (Signed)
  Subjective:  Patient ID: Alfred Jones, male    DOB: 1951/01/31,  MRN: [id number]  Chief Complaint  Patient presents with  . Foot Pain    PT stated that the right foot is worse than the left and he has a burning sensation in his feet     70 y.o. male presents with the above complaint. History confirmed with patient.  Been going on for some time.  He has history of low back problems.  He has had injections for this.  Describes it as burning and tingling pain.  Affects both feet although also his hands of similar pain  Objective:  Physical Exam: warm, good capillary refill, no trophic changes or ulcerative lesions, normal DP and PT pulses and normal monofilament exam.  No pain with range of motion of the measures phalangeal joint, subtalar joint or midtarsal joints.  Strength is 5 out of 5 in all planes   Radiographs: X-ray of both feet: He has some degenerative changes of the first metatarsophalangeal joint, no osseous abnormality to explain his symptoms Assessment:   1. Pain in both feet   2. Spinal stenosis of lumbar region, unspecified whether neurogenic claudication present   3. Chronic right-sided low back pain with right-sided sciatica   4. Paresthesia   5. Idiopathic polyneuropathy      Plan:  Patient was evaluated and treated and all questions answered.  Reviewed the radiographic and clinical findings with patient and his wife.  I discussed with him that he does have some mild arthritis in the great toe joints bilaterally.  However there is no localized pathology in the foot and ankle to explain his symptoms.  Rather I think his symptoms are likely idiopathic polyneuropathy or neurogenic pain from his spinal issues.  I discussed with him that he should discuss this with his neurologist and neurosurgery team.  He said he has tried gabapentin before but was not successful.  Attempt to discuss with him the use of Lyrica.  If they do not think is a good idea than I had be happy to  refer back to his neurology group that he has not seen in several years.  At this point no indication for further imaging or surgical intervention from my standpoint  Return if symptoms worsen or fail to improve.

## 2021-01-17 NOTE — Patient Instructions (Signed)
Ask your doctor about Lyrica (pregabalin)

## 2021-01-19 ENCOUNTER — Other Ambulatory Visit: Payer: Self-pay

## 2021-01-19 ENCOUNTER — Other Ambulatory Visit: Payer: Medicare Other

## 2021-01-19 DIAGNOSIS — N138 Other obstructive and reflux uropathy: Secondary | ICD-10-CM | POA: Diagnosis not present

## 2021-01-19 DIAGNOSIS — N401 Enlarged prostate with lower urinary tract symptoms: Secondary | ICD-10-CM | POA: Diagnosis not present

## 2021-01-20 LAB — PSA, TOTAL AND FREE
PSA, Free Pct: 25.5 %
PSA, Free: 0.51 ng/mL
Prostate Specific Ag, Serum: 2 ng/mL (ref 0.0–4.0)

## 2021-01-26 ENCOUNTER — Other Ambulatory Visit: Payer: Self-pay

## 2021-01-26 ENCOUNTER — Ambulatory Visit (INDEPENDENT_AMBULATORY_CARE_PROVIDER_SITE_OTHER): Payer: Medicare Other | Admitting: Urology

## 2021-01-26 VITALS — BP 133/84 | HR 97

## 2021-01-26 DIAGNOSIS — N401 Enlarged prostate with lower urinary tract symptoms: Secondary | ICD-10-CM | POA: Diagnosis not present

## 2021-01-26 DIAGNOSIS — N138 Other obstructive and reflux uropathy: Secondary | ICD-10-CM

## 2021-01-26 DIAGNOSIS — N5201 Erectile dysfunction due to arterial insufficiency: Secondary | ICD-10-CM | POA: Diagnosis not present

## 2021-01-26 DIAGNOSIS — R3912 Poor urinary stream: Secondary | ICD-10-CM | POA: Diagnosis not present

## 2021-01-26 LAB — URINALYSIS, ROUTINE W REFLEX MICROSCOPIC
Bilirubin, UA: NEGATIVE
Glucose, UA: NEGATIVE
Ketones, UA: NEGATIVE
Leukocytes,UA: NEGATIVE
Nitrite, UA: NEGATIVE
Protein,UA: NEGATIVE
RBC, UA: NEGATIVE
Specific Gravity, UA: 1.015 (ref 1.005–1.030)
Urobilinogen, Ur: 1 mg/dL (ref 0.2–1.0)
pH, UA: 6 (ref 5.0–7.5)

## 2021-01-26 NOTE — Progress Notes (Signed)
Urological Symptom Review  Patient is experiencing the following symptoms: Weak stream   Review of Systems  Gastrointestinal (upper)  : Negative for upper GI symptoms  Gastrointestinal (lower) : Negative for lower GI symptoms  Constitutional : Negative for symptoms  Skin: Negative for skin symptoms  Eyes: Negative for eye symptoms  Ear/Nose/Throat : Negative for Ear/Nose/Throat symptoms  Hematologic/Lymphatic: Negative for Hematologic/Lymphatic symptoms  Cardiovascular : Negative for cardiovascular symptoms  Respiratory : Negative for respiratory symptoms  Endocrine: Negative for endocrine symptoms  Musculoskeletal: Negative for musculoskeletal symptoms  Neurological: Negative for neurological symptoms  Psychologic: Negative for psychiatric symptoms  

## 2021-01-26 NOTE — Progress Notes (Signed)
Subjective:  1. BPH with urinary obstruction   2. Weak urinary stream   3. Erectile dysfunction due to arterial insufficiency      Mr. Borum returns today in f/u for a 6 month f/u for  his history of BPH with BOO and ED.  His PSA is 2 with a 25%f/t ratio.  I was 1.8 in 8/20, 1.7 in 10/19 and 1.02 in 2017.  He was given tadalafil for the BPH and BOO as well as the ED on 06/05/19 and additional sildenafil to use with the tadalafil for the ED has so so results with that.  He has delayed ejaculation.   He remains on alfuzosin at this time. He is no longer on cymbalta. He has stable LUTS with an  IPSS of 17. He has a reduced stream unless he has a full bladder.  He still has some intermittency. He has occasional hesitancy. He has nocturia x 1.  He has no dysuria or hematuria.Marland Kitchen  His testosterone level was 450 at last check.    He had constipation and had a KUB on 07/11/20.  No stones were seen.    He has a new diagnosis of thrombocytosis and he is on Hydroxyurea.        ROS:  ROS:  A complete review of systems was performed.  All systems are negative except for pertinent findings as noted.   ROS  No Known Allergies  Outpatient Encounter Medications as of 01/26/2021  Medication Sig  . acetaminophen (TYLENOL) 500 MG tablet Take 1,000 mg by mouth as needed for mild pain.  Marland Kitchen alfuzosin (UROXATRAL) 10 MG 24 hr tablet Take 1 tablet (10 mg total) by mouth daily with breakfast.  . celecoxib (CELEBREX) 100 MG capsule Take 100 mg by mouth 2 (two) times daily.  Marland Kitchen CETIRIZINE HCL PO Take 2 tablets by mouth daily.  Marland Kitchen CINNAMON PO Take 1,200 mg by mouth daily.  . clotrimazole (LOTRIMIN) 1 % cream Apply 1 application topically at bedtime. Feet  . Co-Enzyme Q-10 100 MG CAPS Take 100 mg by mouth daily.  . diclofenac Sodium (VOLTAREN) 1 % GEL Apply 2 g topically at bedtime. Joint pain  . DULoxetine (CYMBALTA) 60 MG capsule Take 60 mg by mouth daily.  . Flaxseed, Linseed, (FLAX SEED OIL) 1000 MG CAPS Take by  mouth.  . hydroxyurea (HYDREA) 500 MG capsule Take 500 mg by mouth daily. May take with food to minimize GI side effects.  . Melatonin 10 MG CAPS Take 10 mg by mouth at bedtime.   . methocarbamol (ROBAXIN) 500 MG tablet Take 500 mg by mouth 4 (four) times daily.  . Multiple Vitamins-Minerals (MULTIVITAMIN MEN 50+ PO) Take by mouth daily.  Marland Kitchen oxyCODONE-acetaminophen (PERCOCET) 10-325 MG tablet Take 1 tablet by mouth every 4 (four) hours as needed for pain.  . pantoprazole (PROTONIX) 40 MG tablet Take 1 tablet by mouth 2 (two) times daily.  . rosuvastatin (CRESTOR) 5 MG tablet Take 5 mg by mouth daily.  . tadalafil (CIALIS) 5 MG tablet Take 5 mg by mouth daily.  Teena Dunk Cherry 1200 MG CAPS Take by mouth.  . [DISCONTINUED] DULoxetine (CYMBALTA) 60 MG capsule Take 60 mg by mouth at bedtime.  . [DISCONTINUED] celecoxib (CELEBREX) 200 MG capsule Take 200 mg by mouth 2 (two) times daily.  . [DISCONTINUED] methocarbamol (ROBAXIN) 500 MG tablet Take 1 tablet (500 mg total) by mouth every 6 (six) hours as needed for muscle spasms. (Patient taking differently: Take 500 mg by mouth as  needed for muscle spasms.)  . [DISCONTINUED] oxyCODONE 10 MG TABS Take 0.5-1 tablets (5-10 mg total) by mouth every 4 (four) hours as needed for severe pain ((score 7 to 10)). (Patient taking differently: Take 5-10 mg by mouth 2 (two) times daily.)  . [DISCONTINUED] rosuvastatin (CRESTOR) 5 MG tablet Take 5 mg by mouth 2 (two) times a week. Takes on Sunday and Wednesday.  . [DISCONTINUED] traZODone (DESYREL) 50 MG tablet Take 50 mg by mouth at bedtime.    No facility-administered encounter medications on file as of 01/26/2021.    Past Medical History:  Diagnosis Date  . Acid reflux   . Anxiety   . Arthritis    RHEUMATOID OR PSORIATIC ?  . BCC (basal cell carcinoma of skin) 06/19/2007   left lower chest (CX35FU)  . BCC (basal cell carcinoma of skin) infiltrated 03/11/2018   right nose (MOHS)  . BCC (basal cell carcinoma of  skin) ulcerated 07/11/2010   left cheek   . Cancer (HCC)   . Depression   . Enlarged prostate   . Hyperlipidemia   . Numbness   . Sleep apnea    TESTED BACK IN 2017  . Superficial basal cell carcinoma (BCC) 03/11/2018   left shoulder     Past Surgical History:  Procedure Laterality Date  . ANTERIOR CERVICAL DECOMP/DISCECTOMY FUSION N/A 09/23/2018   Procedure: Cervical Three-Four Cervical Four-Five Cervical Five-Six Anterior cervical decompression/discectomy/fusion;  Surgeon: Stern, Joseph, MD;  Location: [facility] OR;  Service: Neurosurgery;  Laterality: N/A;  Cervical Three-Four Cervical Four-Five Cervical Five-Six Anterior cervical decompression/discectomy/fusion  . CARDIAC CATHETERIZATION     20  YRS AGO AT [facility]  . COLONOSCOPY WITH PROPOFOL N/A 06/23/2020   Procedure: COLONOSCOPY WITH PROPOFOL;  Surgeon: Daneil Dolin, MD;  Location: [facility];  Service: Endoscopy;  Laterality: N/A;  12:00pm  . ESOPHAGOGASTRODUODENOSCOPY (EGD) WITH PROPOFOL N/A 08/13/2019   Procedure: ESOPHAGOGASTRODUODENOSCOPY (EGD) WITH PROPOFOL;  Surgeon: Daneil Dolin, MD;  Location: [facility];  Service: Endoscopy;  Laterality: N/A;  8:30am  . EYE SURGERY     METAL REMOVED FROM LEFT EYE  (IN OFFICE)  . MALONEY DILATION N/A 08/13/2019   Procedure: Venia Minks DILATION;  Surgeon: Daneil Dolin, MD;  Location: [facility];  Service: Endoscopy;  Laterality: N/A;  . MOLE REMOVAL    . POLYPECTOMY  06/23/2020   Procedure: POLYPECTOMY;  Surgeon: Daneil Dolin, MD;  Location: [facility];  Service: Endoscopy;;  . TONSILLECTOMY     as a child    Social History   Socioeconomic History  . Marital status: Married    Spouse name: Not on file  . Number of children: 2  . Years of education: 25  . Highest education level: Not on file  Occupational History  . Occupation: Dealer  Tobacco Use  . Smoking status: Former Smoker    Packs/day: 1.00    Years: 16.00    Pack years: 16.00    Types: Cigarettes     Quit date: 10/22/1986    Years since quitting: 34.2  . Smokeless tobacco: Never Used  Vaping Use  . Vaping Use: Never used  Substance and Sexual Activity  . Alcohol use: No  . Drug use: No  . Sexual activity: Not on file  Other Topics Concern  . Not on file  Social History Narrative   Lives at home with his wife and son.   Right-handed.   2-4 cups caffeine per day.   Social Determinants of Health  Financial Resource Strain: Not on file  Food Insecurity: Not on file  Transportation Needs: Not on file  Physical Activity: Not on file  Stress: Not on file  Social Connections: Not on file  Intimate Partner Violence: Not on file    Family History  Problem Relation Age of Onset  . Heart disease Mother   . Diabetes Mother   . Heart attack Mother   . Heart disease Father   . Diabetes Father   . Diabetes Sister   . Seizures Sister   . Diabetes Brother   . Heart attack Brother   . Sleep apnea Brother   . Heart attack Brother   . Sleep apnea Brother   . Heart attack Brother   . Diabetes Brother   . Heart attack Brother   . Hypothyroidism Son   . Hypothyroidism Daughter   . Colon cancer Neg Hx        Objective: Vitals:   01/26/21 1135  BP: 133/84  Pulse: 97     Physical Exam  Lab Results:  Recent Results (from the past 2160 hour(s))  CBC with Differential/Platelet     Status: Abnormal   Collection Time: 12/08/20 12:00 AM  Result Value Ref Range   WBC 5.6 3.8 - 10.8 Thousand/uL   RBC 3.14 (L) 4.20 - 5.80 Million/uL   Hemoglobin 12.5 (L) 13.2 - 17.1 g/dL   HCT 35.0 (L) 38.5 - 50.0 %   MCV 111.5 (H) 80.0 - 100.0 fL   MCH 39.8 (H) 27.0 - 33.0 pg   MCHC 35.7 32.0 - 36.0 g/dL   RDW 14.4 11.0 - 15.0 %   Platelets 272 140 - 400 Thousand/uL   MPV 9.2 7.5 - 12.5 fL   Neutro Abs 2,615 1,500 - 7,800 cells/uL   Lymphs Abs 2,038 850 - 3,900 cells/uL   Absolute Monocytes 756 200 - 950 cells/uL   Eosinophils Absolute 129 15 - 500 cells/uL   Basophils Absolute 62 0 -  200 cells/uL   Neutrophils Relative % 46.7 %   Total Lymphocyte 36.4 %   Monocytes Relative 13.5 %   Eosinophils Relative 2.3 %   Basophils Relative 1.1 %  COMPLETE METABOLIC PANEL WITH GFR     Status: None   Collection Time: 12/08/20 12:00 AM  Result Value Ref Range   Glucose, Bld 72 65 - 99 mg/dL    Comment: .            Fasting reference interval .    BUN 18 7 - 25 mg/dL   Creat 1.13 0.70 - 1.25 mg/dL    Comment: For patients >68 years of age, the reference limit for Creatinine is approximately 13% higher for people identified as African-American. .    GFR, Est Non African American 67 > OR = 60 mL/min/1.11m2   GFR, Est African American 78 > OR = 60 mL/min/1.74m2   BUN/Creatinine Ratio NOT APPLICABLE 6 - 22 (calc)   Sodium 142 135 - 146 mmol/L   Potassium 4.2 3.5 - 5.3 mmol/L   Chloride 106 98 - 110 mmol/L   CO2 30 20 - 32 mmol/L   Calcium 8.6 8.6 - 10.3 mg/dL   Total Protein 6.4 6.1 - 8.1 g/dL   Albumin 4.2 3.6 - 5.1 g/dL   Globulin 2.2 1.9 - 3.7 g/dL (calc)   AG Ratio 1.9 1.0 - 2.5 (calc)   Total Bilirubin 0.6 0.2 - 1.2 mg/dL   Alkaline phosphatase (APISO) 55 35 - 144 U/L  AST 17 10 - 35 U/L   ALT 15 9 - 46 U/L  Sedimentation rate     Status: None   Collection Time: 12/08/20 12:00 AM  Result Value Ref Range   Sed Rate 6 0 - 20 mm/h  Rheumatoid factor     Status: None   Collection Time: 12/08/20 12:00 AM  Result Value Ref Range   Rhuematoid fact SerPl-aCnc <01 <60 IU/mL  Cyclic citrul peptide antibody, IgG     Status: None   Collection Time: 12/08/20 12:00 AM  Result Value Ref Range   Cyclic Citrullin Peptide Ab <16 UNITS    Comment: Reference Range Negative:            <20 Weak Positive:       20-39 Moderate Positive:   40-59 Strong Positive:     >59 .   14-3-3 eta Protein     Status: None   Collection Time: 12/08/20 12:00 AM  Result Value Ref Range   14-3-3 eta Protein <0.2 <0.2 ng/mL    Comment: . The 14-3-3eta protein is a marker of synovial  inflammation that is released into synovial fluid and peripheral blood in rheumatoid arthritis (RA) and erosive psoriatic arthritis. One in five RF and CCP seronegative early stage RA patients is found to be positive for 14-3-3eta protein. Patients with active joint RA disease have higher values of 14-3-3eta protein than those with inactive RA or psoriasis without arthritis. 14-3-3eta protein has a 93% specificity in patients with RA. Values > or = 0.2 ng/mL are elevated and indicative of RA disease or erosive psoriatic arthritis. Values >0.50 ng/mL are associated with more aggressive RA disease and poorer outcomes. Unlike RF and CCP, 14-3-3eta protein is a therapeutically modifiable marker to monitor response to therapy. A decrease in 14-3-3eta protein in response to DMARDs (disease-modifying antirheumatic drugs) and anti-TNF (tumor necrosis factor) drugs indicates better clinical outcomes; an increase is associated with  worse outcomes despite apparent clinical remission. . For further information please visit: [email or web address] . This test was developed and its analytical performance characteristics have been determined by [facility]. It has not been cleared or approved by FDA. This assay has been validated pursuant to the CLIA regulations and is used for clinical purposes. .   Uric acid     Status: None   Collection Time: 12/08/20 12:00 AM  Result Value Ref Range   Uric Acid, Serum 7.2 4.0 - 8.0 mg/dL    Comment: Therapeutic target for gout patients: <6.0 mg/dL .   CK     Status: None   Collection Time: 12/08/20 12:00 AM  Result Value Ref Range   Total CK 129 44 - 196 U/L  TSH     Status: None   Collection Time: 12/08/20 12:00 AM  Result Value Ref Range   TSH 4.18 0.40 - 4.50 mIU/L  B12 and Folate Panel     Status: None   Collection Time: 12/08/20 12:00 AM   Result Value Ref Range   Vitamin B-12 306 200 - 1,100 pg/mL    Comment: . Please Note: Although the reference range for vitamin B12 is [id number] pg/mL, it has been reported that between 5 and 10% of patients with values between 200 and 400 pg/mL may experience neuropsychiatric and hematologic abnormalities due to occult B12 deficiency; less than 1% of patients with values above 400 pg/mL will have symptoms. .    Folate 13.3 ng/mL    Comment:  Reference Range                            Low:           <3.4                            Borderline:    3.4-5.4                            Normal:        >5.4 .   TEST AUTHORIZATION     Status: None   Collection Time: 12/08/20 12:00 AM  Result Value Ref Range   TEST NAME: VITAMIN B12/FOLATE,    TEST CODE: [id number]    CLIENT CONTACT: DONNA CONNER//L    REPORT ALWAYS MESSAGE SIGNATURE      Comment: . The laboratory testing on this patient was verbally requested or confirmed by the ordering physician or his or her authorized representative after contact with an employee of [employer]. Federal regulations require that we maintain on file written authorization for all laboratory testing.  Accordingly we are asking that the ordering physician or his or her authorized representative sign a copy of this report and promptly return it to the client service representative. . . Signature:____________________________________________________ . Please fax this signed page to [phone] or return it via your [employer] courier.   PSA, total and free     Status: None   Collection Time: 01/19/21 11:57 AM  Result Value Ref Range   Prostate Specific Ag, Serum 2.0 0.0 - 4.0 ng/mL    Comment: Roche ECLIA methodology. According to the American Urological Association, Serum PSA should decrease and remain at undetectable levels after radical prostatectomy. The AUA defines biochemical recurrence as an  initial PSA value 0.2 ng/mL or greater followed by a subsequent confirmatory PSA value 0.2 ng/mL or greater. Values obtained with different assay methods or kits cannot be used interchangeably. Results cannot be interpreted as absolute evidence of the presence or absence of malignant disease.    PSA, Free 0.51 N/A ng/mL    Comment: Roche ECLIA methodology.   PSA, Free Pct 25.5 %    Comment: The table below lists the probability of prostate cancer for men with non-suspicious DRE results and total PSA between 4 and 10 ng/mL, by patient age Ricci Barker, [place], [id number]).                   % Free PSA       50-64 yr        65-75 yr                   0.00-10.00%        56%             55%                  10.01-15.00%        24%             35%                  15.01-20.00%        17%             23%  20.01-25.00%        10%             20%                       >25.00%         5%              9% Please note:  Catalona et al did not make specific               recommendations regarding the use of               percent free PSA for any other population               of men.   Urinalysis, Routine w reflex microscopic     Status: None   Collection Time: 01/26/21 11:17 AM  Result Value Ref Range   Specific Gravity, UA 1.015 1.005 - 1.030   pH, UA 6.0 5.0 - 7.5   Color, UA Yellow Yellow   Appearance Ur Clear Clear   Leukocytes,UA Negative Negative   Protein,UA Negative Negative/Trace   Glucose, UA Negative Negative   Ketones, UA Negative Negative   RBC, UA Negative Negative   Bilirubin, UA Negative Negative   Urobilinogen, Ur 1.0 0.2 - 1.0 mg/dL   Nitrite, UA Negative Negative   Microscopic Examination Comment     Comment: Microscopic follows if indicated.     BMET No results for input(s): NA, K, CL, CO2, GLUCOSE, BUN, CREATININE, CALCIUM in the last 72 hours. PSA PSA  Date Value Ref Range Status  12/02/2015 1.12 <=4.00 ng/mL Final    Comment:    Test  Methodology: ECLIA PSA (Electrochemiluminescence Immunoassay)   For PSA values from 2.5-4.0, particularly in younger men <39 years old, the AUA and NCCN suggest testing for % Free PSA (3515) and evaluation of the rate of increase in PSA (PSA velocity).    No results found for: TESTOSTERONE    Studies/Results: Results for orders placed or performed in visit on 01/26/21 (from the past 48 hour(s))  Urinalysis, Routine w reflex microscopic     Status: None   Collection Time: 01/26/21 11:17 AM  Result Value Ref Range   Specific Gravity, UA 1.015 1.005 - 1.030   pH, UA 6.0 5.0 - 7.5   Color, UA Yellow Yellow   Appearance Ur Clear Clear   Leukocytes,UA Negative Negative   Protein,UA Negative Negative/Trace   Glucose, UA Negative Negative   Ketones, UA Negative Negative   RBC, UA Negative Negative   Bilirubin, UA Negative Negative   Urobilinogen, Ur 1.0 0.2 - 1.0 mg/dL   Nitrite, UA Negative Negative   Microscopic Examination Comment     Comment: Microscopic follows if indicated.       Assessment & Plan: BPH with BOO.  He remains on alfuzosin and tadalafil but he has moderate LUTS.  He will return in 6 months with a PSA.  He is interested in considering a minimally invasive prostate procedure to preserve ejaculatory function and improve voiding.  We discussed the risks and benefits of Urolift and Rezum.  I gave him a handout on the Urolift.  He will let me know if he wants to proceed with evaluation.   ED.  He is better with the tadalafil.      No orders of the defined types were placed in this encounter.    Orders Placed This Encounter  Procedures  .  Urinalysis, Routine w reflex microscopic      Return in about 6 months (around 07/28/2021).   CC: Burdine, Virgina Evener, MD      Irine Seal 01/26/2021

## 2021-01-27 ENCOUNTER — Ambulatory Visit: Payer: Medicare Other | Admitting: Urology

## 2021-02-06 ENCOUNTER — Other Ambulatory Visit: Payer: Self-pay

## 2021-02-06 ENCOUNTER — Ambulatory Visit (INDEPENDENT_AMBULATORY_CARE_PROVIDER_SITE_OTHER): Payer: Medicare Other | Admitting: Dermatology

## 2021-02-06 DIAGNOSIS — R238 Other skin changes: Secondary | ICD-10-CM

## 2021-02-06 DIAGNOSIS — R21 Rash and other nonspecific skin eruption: Secondary | ICD-10-CM | POA: Diagnosis not present

## 2021-02-06 DIAGNOSIS — L608 Other nail disorders: Secondary | ICD-10-CM

## 2021-02-06 DIAGNOSIS — B353 Tinea pedis: Secondary | ICD-10-CM

## 2021-02-06 DIAGNOSIS — L03012 Cellulitis of left finger: Secondary | ICD-10-CM

## 2021-02-06 LAB — POCT SKIN KOH

## 2021-02-06 MED ORDER — HALOBETASOL PROPIONATE 0.05 % EX CREA: TOPICAL_CREAM | Freq: Two times a day (BID) | CUTANEOUS | 0 refills | Status: AC | PRN

## 2021-02-07 DIAGNOSIS — M5416 Radiculopathy, lumbar region: Secondary | ICD-10-CM | POA: Diagnosis not present

## 2021-02-07 DIAGNOSIS — M47816 Spondylosis without myelopathy or radiculopathy, lumbar region: Secondary | ICD-10-CM | POA: Diagnosis not present

## 2021-02-07 DIAGNOSIS — M5126 Other intervertebral disc displacement, lumbar region: Secondary | ICD-10-CM | POA: Diagnosis not present

## 2021-02-11 ENCOUNTER — Other Ambulatory Visit: Payer: Self-pay | Admitting: Urology

## 2021-02-14 ENCOUNTER — Encounter: Payer: Self-pay | Admitting: Dermatology

## 2021-02-14 NOTE — Progress Notes (Signed)
   Follow-Up Visit   Subjective  Alfred Jones is a 70 y.o. male who presents for the following: Rash (Rash of hands and feet x ~1 week. Gets between fingers and is very itchy. Breaks out in little blisters. /).  Dermatitis hands plus check some other spots Location:  Duration:  Quality:  Associated Signs/Symptoms: Modifying Factors:  Severity:  Timing: Context:   Objective  Well appearing patient in no apparent distress; mood and affect are within normal limits. Objective  Left Hand - Posterior, Right Hand - Posterior: Patchy chronic dermatitis or dorsal palmar hands.  Main differential is contact dermatitis versus adult eczema.  I discussed possible future patch testing.  For now patient prefers to continue treatment as long as it is mostly under control.  Objective  Left Hand - Posterior: Chronic tinea but clinically benign marginated dermatitis on hands is not tinea Mannam.  Objective  Left Lower Vermilion Lip: Soft blue 4 mm dermal papule  Objective  Right 2nd Finger Nail Plate: Light brown to millimeter parallel melanonychia, no dermoscopic atypia.  Images      All skin waist up examined.   Assessment & Plan    Rash (2) Left Hand - Posterior; Right Hand - Posterior  Okay continue to use of topical halobetasol.  Ideally use this episodically rather than daily.  Never use on face or body folds.  Ordered Medications: halobetasol (ULTRAVATE) 0.05 % cream  Tinea pedis of left foot Left Hand - Posterior  KOH and culture obtained.  Defer intervention pending BX results.  Other Related Procedures Culture, fungus without smear POCT Skin KOH  Venous lake Left Lower Vermilion Lip  No intervention currently necessary  Longitudinal melanonychia Right 2nd Finger Nail Plate  Discussed the association of progressive melanic you with melanoma; for now patient chooses to defer biopsy unless there is clinical change.      I, Lavonna Monarch, MD, have  reviewed all documentation for this visit.  The documentation on 02/14/21 for the exam, diagnosis, procedures, and orders are all accurate and complete.

## 2021-02-21 DIAGNOSIS — D649 Anemia, unspecified: Secondary | ICD-10-CM | POA: Diagnosis not present

## 2021-02-21 DIAGNOSIS — Z0001 Encounter for general adult medical examination with abnormal findings: Secondary | ICD-10-CM | POA: Diagnosis not present

## 2021-02-21 DIAGNOSIS — Z6832 Body mass index (BMI) 32.0-32.9, adult: Secondary | ICD-10-CM | POA: Diagnosis not present

## 2021-02-21 DIAGNOSIS — K219 Gastro-esophageal reflux disease without esophagitis: Secondary | ICD-10-CM | POA: Diagnosis not present

## 2021-02-21 DIAGNOSIS — R5383 Other fatigue: Secondary | ICD-10-CM | POA: Diagnosis not present

## 2021-02-21 DIAGNOSIS — D519 Vitamin B12 deficiency anemia, unspecified: Secondary | ICD-10-CM | POA: Diagnosis not present

## 2021-02-21 DIAGNOSIS — E7849 Other hyperlipidemia: Secondary | ICD-10-CM | POA: Diagnosis not present

## 2021-02-21 DIAGNOSIS — G4730 Sleep apnea, unspecified: Secondary | ICD-10-CM | POA: Diagnosis not present

## 2021-02-23 DIAGNOSIS — D7589 Other specified diseases of blood and blood-forming organs: Secondary | ICD-10-CM | POA: Diagnosis not present

## 2021-02-27 DIAGNOSIS — M2550 Pain in unspecified joint: Secondary | ICD-10-CM | POA: Diagnosis not present

## 2021-02-27 DIAGNOSIS — M4712 Other spondylosis with myelopathy, cervical region: Secondary | ICD-10-CM | POA: Diagnosis not present

## 2021-02-27 DIAGNOSIS — E7849 Other hyperlipidemia: Secondary | ICD-10-CM | POA: Diagnosis not present

## 2021-02-27 DIAGNOSIS — D473 Essential (hemorrhagic) thrombocythemia: Secondary | ICD-10-CM | POA: Diagnosis not present

## 2021-02-27 DIAGNOSIS — D7589 Other specified diseases of blood and blood-forming organs: Secondary | ICD-10-CM | POA: Diagnosis not present

## 2021-02-27 DIAGNOSIS — Z6832 Body mass index (BMI) 32.0-32.9, adult: Secondary | ICD-10-CM | POA: Diagnosis not present

## 2021-02-27 DIAGNOSIS — I7381 Erythromelalgia: Secondary | ICD-10-CM | POA: Diagnosis not present

## 2021-02-27 DIAGNOSIS — L4050 Arthropathic psoriasis, unspecified: Secondary | ICD-10-CM | POA: Diagnosis not present

## 2021-03-08 LAB — CULTURE, FUNGUS WITHOUT SMEAR
CULTURE:: NO GROWTH
MICRO NUMBER:: 11781343
SPECIMEN QUALITY:: ADEQUATE

## 2021-03-15 DIAGNOSIS — R059 Cough, unspecified: Secondary | ICD-10-CM | POA: Diagnosis not present

## 2021-03-15 DIAGNOSIS — Z20828 Contact with and (suspected) exposure to other viral communicable diseases: Secondary | ICD-10-CM | POA: Diagnosis not present

## 2021-03-28 DIAGNOSIS — M5126 Other intervertebral disc displacement, lumbar region: Secondary | ICD-10-CM | POA: Diagnosis not present

## 2021-03-28 DIAGNOSIS — M47816 Spondylosis without myelopathy or radiculopathy, lumbar region: Secondary | ICD-10-CM | POA: Diagnosis not present

## 2021-03-28 DIAGNOSIS — M48061 Spinal stenosis, lumbar region without neurogenic claudication: Secondary | ICD-10-CM | POA: Diagnosis not present

## 2021-04-11 ENCOUNTER — Encounter: Payer: Self-pay | Admitting: Gastroenterology

## 2021-04-11 ENCOUNTER — Ambulatory Visit (INDEPENDENT_AMBULATORY_CARE_PROVIDER_SITE_OTHER): Payer: Medicare Other | Admitting: Gastroenterology

## 2021-04-11 ENCOUNTER — Ambulatory Visit: Payer: Medicare Other | Admitting: Nurse Practitioner

## 2021-04-11 ENCOUNTER — Other Ambulatory Visit: Payer: Self-pay

## 2021-04-11 VITALS — BP 109/65 | HR 71 | Temp 97.3°F | Ht 70.0 in | Wt 224.0 lb

## 2021-04-11 DIAGNOSIS — K219 Gastro-esophageal reflux disease without esophagitis: Secondary | ICD-10-CM | POA: Diagnosis not present

## 2021-04-11 DIAGNOSIS — K5900 Constipation, unspecified: Secondary | ICD-10-CM

## 2021-04-11 NOTE — Patient Instructions (Signed)
Continue pantoprazole (Protonix once daily).   For constipation: increase water intake. Add Benefiber 2 teaspoons daily in your beverage of choice. You can increase this to three times per day.  We have provided samples of Linzess for you!  We will see you in 1 year or sooner if needed!   It was a pleasure to see you today. I want to create trusting relationships with patients to provide genuine, compassionate, and quality care. I value your feedback. If you receive a survey regarding your visit,  I greatly appreciate you taking time to fill this out.   Annitta Needs, PhD, ANP-BC [facility]

## 2021-04-11 NOTE — Progress Notes (Signed)
Referring Provider: Curlene Labrum, MD Primary Care Physician:  Curlene Labrum, MD Primary GI: Dr. Gala Romney    Chief Complaint  Patient presents with   Constipation    Can go 2-3 days without BM, linzess is expensive. [place] as needed. Ran out few months ago   Gastroesophageal Reflux    Doing okay    HPI:   Alfred Jones is a 70 y.o. male presenting today with a history of GERD, Schatzki's ring s/p dilation in 2020, last colonoscopy in 2021 with surveillance in 2031, chronic constipation.  Constipation: Linzess too expensive. No melena or hematochezia. Drinking more water. BM usually every 2-3 days. Some straining. Tries to eat more greens. Taking Linzess just as needed. Would like more samples. Not drinking enough water. Interested in staying with Linzess just prn and not trying another agent.    GERD: Protonix once daily. Dysphagia resolved. No abdominal pain.     Past Medical History:  Diagnosis Date   Acid reflux    Anxiety    Arthritis    RHEUMATOID OR PSORIATIC ?   BCC (basal cell carcinoma of skin) 06/19/2007   left lower chest (CX35FU)   BCC (basal cell carcinoma of skin) infiltrated 03/11/2018   right nose (MOHS)   BCC (basal cell carcinoma of skin) ulcerated 07/11/2010   left cheek    Cancer ([place])    Depression    Enlarged prostate    Hyperlipidemia    Numbness    Sleep apnea    TESTED BACK IN 2017   Superficial basal cell carcinoma (BCC) 03/11/2018   left shoulder     Past Surgical History:  Procedure Laterality Date   ANTERIOR CERVICAL DECOMP/DISCECTOMY FUSION N/A 09/23/2018   Procedure: Cervical Three-Four Cervical Four-Five Cervical Five-Six Anterior cervical decompression/discectomy/fusion;  Surgeon: Erline Levine, MD;  Location: [place];  Service: Neurosurgery;  Laterality: N/A;  Cervical Three-Four Cervical Four-Five Cervical Five-Six Anterior cervical decompression/discectomy/fusion   CARDIAC CATHETERIZATION     20 YRS AGO AT [facility]    COLONOSCOPY WITH PROPOFOL N/A 06/23/2020   One small polyp at hepatic flexure that was sessile and ablated without specimen for pathologist. Surveillance 10 years if overall health permits.   ESOPHAGOGASTRODUODENOSCOPY (EGD) WITH PROPOFOL N/A 08/13/2019   prominent Schatzki ring s/p dilation, overlying erosive reflux esophagitis, small hiatal hernia   EYE SURGERY     METAL REMOVED FROM LEFT EYE  (IN OFFICE)   MALONEY DILATION N/A 08/13/2019   Procedure: Venia Minks DILATION;  Surgeon: Daneil Dolin, MD;  [facility];  Service: Endoscopy;  Laterality: N/A;   MOLE REMOVAL     POLYPECTOMY  06/23/2020   Procedure: POLYPECTOMY;  Surgeon: Daneil Dolin, MD;  [facility];  Service: Endoscopy;;   TONSILLECTOMY     as a child    Current Outpatient Medications  Medication Sig Dispense Refill   acetaminophen (TYLENOL) 500 MG tablet Take 1,000 mg by mouth as needed for mild pain.     alfuzosin (UROXATRAL) 10 MG 24 hr tablet Take 1 tablet (10 mg total) by mouth daily with breakfast. 90 tablet 3   aspirin EC 81 MG tablet Take 81 mg by mouth daily. Swallow whole.     celecoxib (CELEBREX) 100 MG capsule Take 200 mg by mouth 2 (two) times daily.     CETIRIZINE HCL PO Take 2 tablets by mouth daily.     CINNAMON PO Take 1,200 mg by mouth daily.  clotrimazole (LOTRIMIN) 1 % cream Apply 1 application topically at bedtime. Feet     Co-Enzyme Q-10 100 MG CAPS Take 100 mg by mouth daily.     diclofenac Sodium (VOLTAREN) 1 % GEL Apply 2 g topically at bedtime. Joint pain     DULoxetine (CYMBALTA) 60 MG capsule Take 60 mg by mouth daily.     Flaxseed, Linseed, (FLAX SEED OIL) 1000 MG CAPS Take by mouth.     hydroxyurea (HYDREA) 500 MG capsule Take 500 mg by mouth daily. May take with food to minimize GI side effects.     Melatonin 10 MG CAPS Take 10 mg by mouth at bedtime.      methocarbamol (ROBAXIN) 500 MG tablet Take 500 mg by mouth 4 (four) times daily.     Multiple  Vitamins-Minerals (MULTIVITAMIN MEN 50+ PO) Take by mouth daily.     oxyCODONE-acetaminophen (PERCOCET) 10-325 MG tablet Take 1 tablet by mouth every 4 (four) hours as needed for pain.     pantoprazole (PROTONIX) 40 MG tablet Take 1 tablet by mouth daily.     polyethylene glycol (MIRALAX / GLYCOLAX) 17 g packet Take 17 g by mouth daily as needed.     pregabalin (LYRICA) 100 MG capsule Take 100 mg by mouth daily.     rosuvastatin (CRESTOR) 5 MG tablet Take 5 mg by mouth daily.     tadalafil (CIALIS) 5 MG tablet Take 1 tablet by mouth once daily 30 tablet 11   Tart Cherry 1200 MG CAPS Take by mouth.     traZODone (DESYREL) 50 MG tablet Take 50 mg by mouth at bedtime.     TURMERIC PO Take by mouth at bedtime.     No current facility-administered medications for this visit.    Allergies as of 04/11/2021   (No Known Allergies)    Family History  Problem Relation Age of Onset   Heart disease Mother    Diabetes Mother    Heart attack Mother    Heart disease Father    Diabetes Father    Diabetes Sister    Seizures Sister    Diabetes Brother    Heart attack Brother    Sleep apnea Brother    Heart attack Brother    Sleep apnea Brother    Heart attack Brother    Diabetes Brother    Heart attack Brother    Hypothyroidism Son    Hypothyroidism Daughter    Colon cancer Neg Hx     Social History   Socioeconomic History   Marital status: Married    Spouse name: Not on file   Number of children: 2   Years of education: 12   Highest education level: Not on file  Occupational History   Occupation: Dealer  Tobacco Use   Smoking status: Former    Packs/day: 1.00    Years: 16.00    Pack years: 16.00    Types: Cigarettes    Quit date: 10/22/1986    Years since quitting: 34.4   Smokeless tobacco: Never  Vaping Use   Vaping Use: Never used  Substance and Sexual Activity   Alcohol use: No   Drug use: No   Sexual activity: Not on file  Other Topics Concern   Not on file   Social History Narrative   Lives at home with his wife and son.   Right-handed.   2-4 cups caffeine per day.   Social Determinants of Health   Financial Resource Strain: Not on  file  Food Insecurity: Not on file  Transportation Needs: Not on file  Physical Activity: Not on file  Stress: Not on file  Social Connections: Not on file    Review of Systems: Gen: Denies fever, chills, anorexia. Denies fatigue, weakness, weight loss.  CV: Denies chest pain, palpitations, syncope, peripheral edema, and claudication. Resp: Denies dyspnea at rest, cough, wheezing, coughing up blood, and pleurisy. GI: see HPI Derm: Denies rash, itching, dry skin Psych: Denies depression, anxiety, memory loss, confusion. No homicidal or suicidal ideation.  Heme: Denies bruising, bleeding, and enlarged lymph nodes.  Physical Exam: BP 109/65   Pulse 71   Temp (!) 97.3 F (36.3 C)   Ht 5\' 10"  (1.778 m)   Wt 224 lb (101.6 kg)   BMI 32.14 kg/m  General:   Alert and oriented. No distress noted. Pleasant and cooperative.  Head:  Normocephalic and atraumatic. Eyes:  Conjuctiva clear without scleral icterus. Mouth:  mask in place Abdomen:  +BS, soft, non-tender and non-distended. No rebound or guarding. No HSM or masses noted. Msk:  Symmetrical without gross deformities. Normal posture. Extremities:  Without edema. Neurologic:  Alert and  oriented x4 Psych:  Alert and cooperative. Normal mood and affect.  ASSESSMENT: Alfred Jones is a 70 y.o. male presenting today with a history of GERD, Schatzki's ring s/p dilation in 2020, last colonoscopy in 2021 with surveillance in 2031, chronic constipation.  Constipation: likely dietary related. Declining trial of different prescriptive agent as Linzess was too expensive. I feel he would benefit from adding Benefiber daily and increasing water intake. We also discussed Linzess novel dosing of prn with samples he has.   GERD: no dysphagia. Doing well on PPI daily.    PLAN:  Protonix daily Add Benefiber, increase water Novel dosing Linzess prn Call if no improvement and will trial Amitiza Return in 1 year or sooner if needed  Annitta Needs, PhD, ANP-BC [facility] Gastroenterology

## 2021-04-25 DIAGNOSIS — D473 Essential (hemorrhagic) thrombocythemia: Secondary | ICD-10-CM | POA: Diagnosis not present

## 2021-04-25 DIAGNOSIS — D75839 Thrombocytosis, unspecified: Secondary | ICD-10-CM | POA: Diagnosis not present

## 2021-05-02 DIAGNOSIS — R5383 Other fatigue: Secondary | ICD-10-CM | POA: Diagnosis not present

## 2021-05-02 DIAGNOSIS — Z6830 Body mass index (BMI) 30.0-30.9, adult: Secondary | ICD-10-CM | POA: Diagnosis not present

## 2021-05-02 DIAGNOSIS — D473 Essential (hemorrhagic) thrombocythemia: Secondary | ICD-10-CM | POA: Diagnosis not present

## 2021-05-03 DIAGNOSIS — D473 Essential (hemorrhagic) thrombocythemia: Secondary | ICD-10-CM | POA: Diagnosis not present

## 2021-05-03 DIAGNOSIS — R5383 Other fatigue: Secondary | ICD-10-CM | POA: Diagnosis not present

## 2021-05-11 DIAGNOSIS — D75839 Thrombocytosis, unspecified: Secondary | ICD-10-CM | POA: Diagnosis not present

## 2021-05-11 DIAGNOSIS — R161 Splenomegaly, not elsewhere classified: Secondary | ICD-10-CM | POA: Diagnosis not present

## 2021-05-22 DIAGNOSIS — K219 Gastro-esophageal reflux disease without esophagitis: Secondary | ICD-10-CM | POA: Diagnosis not present

## 2021-05-22 DIAGNOSIS — E782 Mixed hyperlipidemia: Secondary | ICD-10-CM | POA: Diagnosis not present

## 2021-05-22 DIAGNOSIS — Z1329 Encounter for screening for other suspected endocrine disorder: Secondary | ICD-10-CM | POA: Diagnosis not present

## 2021-05-22 DIAGNOSIS — E7849 Other hyperlipidemia: Secondary | ICD-10-CM | POA: Diagnosis not present

## 2021-05-29 DIAGNOSIS — D473 Essential (hemorrhagic) thrombocythemia: Secondary | ICD-10-CM | POA: Diagnosis not present

## 2021-05-29 DIAGNOSIS — K5909 Other constipation: Secondary | ICD-10-CM | POA: Diagnosis not present

## 2021-05-29 DIAGNOSIS — I7381 Erythromelalgia: Secondary | ICD-10-CM | POA: Diagnosis not present

## 2021-05-29 DIAGNOSIS — L4050 Arthropathic psoriasis, unspecified: Secondary | ICD-10-CM | POA: Diagnosis not present

## 2021-05-29 DIAGNOSIS — G4733 Obstructive sleep apnea (adult) (pediatric): Secondary | ICD-10-CM | POA: Diagnosis not present

## 2021-05-29 DIAGNOSIS — D7589 Other specified diseases of blood and blood-forming organs: Secondary | ICD-10-CM | POA: Diagnosis not present

## 2021-05-29 DIAGNOSIS — M2550 Pain in unspecified joint: Secondary | ICD-10-CM | POA: Diagnosis not present

## 2021-05-29 DIAGNOSIS — E7849 Other hyperlipidemia: Secondary | ICD-10-CM | POA: Diagnosis not present

## 2021-06-12 DIAGNOSIS — H25043 Posterior subcapsular polar age-related cataract, bilateral: Secondary | ICD-10-CM | POA: Diagnosis not present
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
## 2021-07-04 ENCOUNTER — Ambulatory Visit (INDEPENDENT_AMBULATORY_CARE_PROVIDER_SITE_OTHER): Payer: Medicare Other | Admitting: Rheumatology

## 2021-07-04 ENCOUNTER — Encounter: Payer: Self-pay | Admitting: Rheumatology

## 2021-07-04 ENCOUNTER — Other Ambulatory Visit: Payer: Self-pay

## 2021-07-04 VITALS — BP 112/68 | HR 62 | Ht 70.0 in | Wt 225.0 lb

## 2021-07-04 DIAGNOSIS — Z840 Family history of diseases of the skin and subcutaneous tissue: Secondary | ICD-10-CM

## 2021-07-04 DIAGNOSIS — M5136 Other intervertebral disc degeneration, lumbar region: Secondary | ICD-10-CM | POA: Diagnosis not present

## 2021-07-04 DIAGNOSIS — D0439 Carcinoma in situ of skin of other parts of face: Secondary | ICD-10-CM

## 2021-07-04 DIAGNOSIS — Z8719 Personal history of other diseases of the digestive system: Secondary | ICD-10-CM

## 2021-07-04 DIAGNOSIS — M79641 Pain in right hand: Secondary | ICD-10-CM

## 2021-07-04 DIAGNOSIS — R21 Rash and other nonspecific skin eruption: Secondary | ICD-10-CM | POA: Diagnosis not present

## 2021-07-04 DIAGNOSIS — M19041 Primary osteoarthritis, right hand: Secondary | ICD-10-CM | POA: Diagnosis not present

## 2021-07-04 DIAGNOSIS — G589 Mononeuropathy, unspecified: Secondary | ICD-10-CM

## 2021-07-04 DIAGNOSIS — M170 Bilateral primary osteoarthritis of knee: Secondary | ICD-10-CM

## 2021-07-04 DIAGNOSIS — Z8639 Personal history of other endocrine, nutritional and metabolic disease: Secondary | ICD-10-CM

## 2021-07-04 DIAGNOSIS — D473 Essential (hemorrhagic) thrombocythemia: Secondary | ICD-10-CM | POA: Diagnosis not present

## 2021-07-04 DIAGNOSIS — I7381 Erythromelalgia: Secondary | ICD-10-CM | POA: Diagnosis not present

## 2021-07-04 DIAGNOSIS — M5030 Other cervical disc degeneration, unspecified cervical region: Secondary | ICD-10-CM

## 2021-07-04 DIAGNOSIS — Z79899 Other long term (current) drug therapy: Secondary | ICD-10-CM

## 2021-07-04 DIAGNOSIS — M19071 Primary osteoarthritis, right ankle and foot: Secondary | ICD-10-CM

## 2021-07-04 DIAGNOSIS — M19072 Primary osteoarthritis, left ankle and foot: Secondary | ICD-10-CM

## 2021-07-04 DIAGNOSIS — M19042 Primary osteoarthritis, left hand: Secondary | ICD-10-CM

## 2021-07-04 DIAGNOSIS — M5126 Other intervertebral disc displacement, lumbar region: Secondary | ICD-10-CM | POA: Diagnosis not present

## 2021-07-04 DIAGNOSIS — E559 Vitamin D deficiency, unspecified: Secondary | ICD-10-CM

## 2021-07-04 DIAGNOSIS — M48061 Spinal stenosis, lumbar region without neurogenic claudication: Secondary | ICD-10-CM | POA: Diagnosis not present

## 2021-07-04 DIAGNOSIS — M79642 Pain in left hand: Secondary | ICD-10-CM

## 2021-07-26 DIAGNOSIS — R5383 Other fatigue: Secondary | ICD-10-CM | POA: Diagnosis not present

## 2021-07-26 DIAGNOSIS — D473 Essential (hemorrhagic) thrombocythemia: Secondary | ICD-10-CM | POA: Diagnosis not present

## 2021-07-27 ENCOUNTER — Encounter: Payer: Self-pay | Admitting: Urology

## 2021-07-27 ENCOUNTER — Ambulatory Visit (INDEPENDENT_AMBULATORY_CARE_PROVIDER_SITE_OTHER): Payer: Medicare Other | Admitting: Urology

## 2021-07-27 ENCOUNTER — Other Ambulatory Visit: Payer: Self-pay

## 2021-07-27 VITALS — BP 99/55 | HR 97

## 2021-07-27 DIAGNOSIS — N138 Other obstructive and reflux uropathy: Secondary | ICD-10-CM

## 2021-07-27 DIAGNOSIS — N5311 Retarded ejaculation: Secondary | ICD-10-CM | POA: Diagnosis not present

## 2021-07-27 DIAGNOSIS — N5201 Erectile dysfunction due to arterial insufficiency: Secondary | ICD-10-CM

## 2021-07-27 DIAGNOSIS — N401 Enlarged prostate with lower urinary tract symptoms: Secondary | ICD-10-CM

## 2021-07-27 DIAGNOSIS — R3912 Poor urinary stream: Secondary | ICD-10-CM

## 2021-07-27 LAB — URINALYSIS, ROUTINE W REFLEX MICROSCOPIC
Bilirubin, UA: NEGATIVE
Glucose, UA: NEGATIVE
Leukocytes,UA: NEGATIVE
Nitrite, UA: NEGATIVE
RBC, UA: NEGATIVE
Specific Gravity, UA: >1.03 — ABNORMAL HIGH (ref 1.005–1.030)
Urobilinogen, Ur: 1 mg/dL (ref 0.2–1.0)
pH, UA: 5.5 (ref 5.0–7.5)

## 2021-07-27 MED ORDER — ALFUZOSIN HCL ER 10 MG PO TB24: 10.0000 mg | ORAL_TABLET | Freq: Every day | ORAL | 3 refills | Status: DC

## 2021-07-27 MED ORDER — TADALAFIL 5 MG PO TABS: 5.0000 mg | ORAL_TABLET | Freq: Every day | ORAL | 3 refills | Status: DC

## 2021-07-27 NOTE — Progress Notes (Signed)
Urological Symptom Review  Patient is experiencing the following symptoms: Stream starts and stops Weak stream   Review of Systems  Gastrointestinal (upper)  : Negative for upper GI symptoms  Gastrointestinal (lower) : Negative for lower GI symptoms  Constitutional : Negative for symptoms  Skin: Negative for skin symptoms  Eyes: Negative for eye symptoms  Ear/Nose/Throat : Negative for Ear/Nose/Throat symptoms  Hematologic/Lymphatic: Negative for Hematologic/Lymphatic symptoms  Cardiovascular : Negative for cardiovascular symptoms  Respiratory : Negative for respiratory symptoms  Endocrine: Negative for endocrine symptoms  Musculoskeletal: Negative for musculoskeletal symptoms  Neurological: Negative for neurological symptoms  Psychologic: Negative for psychiatric symptoms

## 2021-07-27 NOTE — Progress Notes (Signed)
Subjective:  1. BPH with urinary obstruction   2. Weak urinary stream   3. Erectile dysfunction due to arterial insufficiency   4. Retarded ejaculation      [name] returns today in f/u for a 6 month f/u for  his history of BPH with BOO and ED.  His PSA is 2 with a 25%f/t ratio.  I was 1.8 in 8/20, 1.7 in 10/19 and 1.02 in 2017.  He was given tadalafil for the BPH and BOO as well as the ED on 06/05/19 and additional sildenafil to use with the tadalafil for the ED has so so results with that.  He has delayed ejaculation.   He remains on alfuzosin at this time. He is no longer on cymbalta. He has stable LUTS with an  IPSS of 17. He has a reduced stream unless he has a full bladder.  He still has some intermittency. He has occasional hesitancy. He has nocturia x 1.  He has no dysuria or hematuria.Marland Kitchen  His testosterone level was 450 at last check.    He had constipation and had a KUB on 07/11/20.  No stones were seen.    He has a new diagnosis of thrombocytosis and he is on Hydroxyurea.     07/27/21: Alfred Jones returns today in f/u for his history as noted above.  He remains on tadalafil and alfuzosin for ED and BPH.  His IPSS is 10 with a reduced stream.  He has difficulty reaching a climax still.  He remains on trazadone and lyrica. He had previously come off of the cymbalta.    ROS:  ROS:  A complete review of systems was performed.  All systems are negative except for pertinent findings as noted.   ROS  No Known Allergies  Outpatient Encounter Medications as of 07/27/2021  Medication Sig   acetaminophen (TYLENOL) 500 MG tablet Take 1,000 mg by mouth as needed for mild pain.   aspirin 325 MG tablet Take 325 mg by mouth daily.   aspirin EC 81 MG tablet Take 81 mg by mouth daily. Swallow whole.   celecoxib (CELEBREX) 100 MG capsule Take 200 mg by mouth 2 (two) times daily.   celecoxib (CELEBREX) 200 MG capsule    CETIRIZINE HCL PO Take 2 tablets by mouth daily.   Cholecalciferol (VITAMIN D3)  250 MCG (10000 UT) capsule Take 10,000 Units by mouth daily.   CINNAMON PO Take 1,200 mg by mouth daily.   clotrimazole (LOTRIMIN) 1 % cream Apply 1 application topically at bedtime. Feet   Co-Enzyme Q-10 100 MG CAPS Take 100 mg by mouth daily.   diclofenac Sodium (VOLTAREN) 1 % GEL Apply 2 g topically at bedtime. Joint pain   docusate sodium (COLACE) 100 MG capsule Take 100 mg by mouth daily.   FIBER PO Take by mouth daily. Natural fibers found in fruits and vegetables.   Flaxseed, Linseed, (FLAX SEED OIL) 1000 MG CAPS Take by mouth.   hydroxyurea (HYDREA) 500 MG capsule Take 500 mg by mouth daily. May take with food to minimize GI side effects.   ketoconazole (NIZORAL) 2 % cream Apply 1 application topically daily.   linaclotide (LINZESS) 290 MCG CAPS capsule Take 290 mcg by mouth daily before breakfast.   Melatonin 10 MG CAPS Take 10 mg by mouth at bedtime.    methocarbamol (ROBAXIN) 500 MG tablet Take 500 mg by mouth daily.   Multiple Vitamins-Minerals (MULTIVITAMIN MEN 50+ PO) Take by mouth daily.   OVER THE COUNTER MEDICATION  Take 1,000 mg by mouth daily. Beet root   Oxycodone HCl 10 MG TABS Take by mouth.   oxyCODONE-acetaminophen (PERCOCET) 10-325 MG tablet Take 1 tablet by mouth every 4 (four) hours as needed for pain.   pantoprazole (PROTONIX) 40 MG tablet Take 1 tablet by mouth daily.   polyethylene glycol (MIRALAX / GLYCOLAX) 17 g packet Take 17 g by mouth daily as needed.   pregabalin (LYRICA) 100 MG capsule Take 100 mg by mouth daily.   pregabalin (LYRICA) 50 MG capsule Take 50-100 mg by mouth at bedtime.   rosuvastatin (CRESTOR) 5 MG tablet Take 5 mg by mouth 2 (two) times a week.   Tart Cherry 1200 MG CAPS Take by mouth.   traZODone (DESYREL) 50 MG tablet Take 50 mg by mouth at bedtime.   TURMERIC PO Take by mouth at bedtime.   [DISCONTINUED] alfuzosin (UROXATRAL) 10 MG 24 hr tablet Take 1 tablet (10 mg total) by mouth daily with breakfast.   [DISCONTINUED] DULoxetine  (CYMBALTA) 60 MG capsule Take 60 mg by mouth daily.   [DISCONTINUED] tadalafil (CIALIS) 5 MG tablet Take 1 tablet by mouth once daily   alfuzosin (UROXATRAL) 10 MG 24 hr tablet Take 1 tablet (10 mg total) by mouth daily with breakfast.   tadalafil (CIALIS) 5 MG tablet Take 1 tablet (5 mg total) by mouth daily.   No facility-administered encounter medications on file as of 07/27/2021.    Past Medical History:  Diagnosis Date   Acid reflux    Anxiety    Arthritis    RHEUMATOID OR PSORIATIC ?   BCC (basal cell carcinoma of skin) 06/19/2007   left lower chest (CX35FU)   BCC (basal cell carcinoma of skin) infiltrated 03/11/2018   right nose (MOHS)   BCC (basal cell carcinoma of skin) ulcerated 07/11/2010   left cheek    Cancer ([facility])    Depression    Enlarged prostate    Hyperlipidemia    Numbness    Sleep apnea    TESTED BACK IN 2017   Superficial basal cell carcinoma (BCC) 03/11/2018   left shoulder     Past Surgical History:  Procedure Laterality Date   ANTERIOR CERVICAL DECOMP/DISCECTOMY FUSION N/A 09/23/2018   Procedure: Cervical Three-Four Cervical Four-Five Cervical Five-Six Anterior cervical decompression/discectomy/fusion;  Surgeon: Erline Levine, MD;  Location: [place];  Service: Neurosurgery;  Laterality: N/A;  Cervical Three-Four Cervical Four-Five Cervical Five-Six Anterior cervical decompression/discectomy/fusion   CARDIAC CATHETERIZATION     20 YRS AGO AT [facility]   COLONOSCOPY WITH PROPOFOL N/A 06/23/2020   One small polyp at hepatic flexure that was sessile and ablated without specimen for pathologist. Surveillance 10 years if overall health permits.   ESOPHAGOGASTRODUODENOSCOPY (EGD) WITH PROPOFOL N/A 08/13/2019   prominent Schatzki ring s/p dilation, overlying erosive reflux esophagitis, small hiatal hernia   EYE SURGERY     METAL REMOVED FROM LEFT EYE  (IN OFFICE)   MALONEY DILATION N/A 08/13/2019   Procedure: Venia Minks DILATION;  Surgeon: Daneil Dolin, MD;   Location: [facility];  Service: Endoscopy;  Laterality: N/A;   MOLE REMOVAL     POLYPECTOMY  06/23/2020   Procedure: POLYPECTOMY;  Surgeon: Daneil Dolin, MD;  Location: [facility];  Service: Endoscopy;;   TONSILLECTOMY     as a child    Social History   Socioeconomic History   Marital status: Married    Spouse name: Not on file   Number of children: 2   Years of  education: 12   Highest education level: Not on file  Occupational History   Occupation: Dealer  Tobacco Use   Smoking status: Former    Packs/day: 1.00    Years: 16.00    Pack years: 16.00    Types: Cigarettes    Quit date: 10/22/1986    Years since quitting: 34.7   Smokeless tobacco: Never  Vaping Use   Vaping Use: Never used  Substance and Sexual Activity   Alcohol use: No   Drug use: No   Sexual activity: Not on file  Other Topics Concern   Not on file  Social History Narrative   Lives at home with his wife and son.   Right-handed.   2-4 cups caffeine per day.   Social Determinants of Health   Financial Resource Strain: Not on file  Food Insecurity: Not on file  Transportation Needs: Not on file  Physical Activity: Not on file  Stress: Not on file  Social Connections: Not on file  Intimate Partner Violence: Not on file    Family History  Problem Relation Age of Onset   Heart disease Mother    Diabetes Mother    Heart attack Mother    Heart disease Father    Diabetes Father    Diabetes Sister    Seizures Sister    Diabetes Brother    Heart attack Brother    Sleep apnea Brother    Heart attack Brother    Sleep apnea Brother    Heart attack Brother    Diabetes Brother    Heart attack Brother    Hypothyroidism Son    Hypothyroidism Daughter    Colon cancer Neg Hx        Objective: Vitals:   07/27/21 1519  BP: (!) 99/55  Pulse: 97     Physical Exam  Lab Results:  Recent Results (from the past 2160 hour(s))  Urinalysis, Routine w reflex microscopic     Status:  Abnormal   Collection Time: 07/27/21  3:24 PM  Result Value Ref Range   Specific Gravity, UA >1.030 (H) 1.005 - 1.030   pH, UA 5.5 5.0 - 7.5   Color, UA Yellow Yellow   Appearance Ur Clear Clear   Leukocytes,UA Negative Negative   Protein,UA Trace (A) Negative/Trace   Glucose, UA Negative Negative   Ketones, UA Trace (A) Negative   RBC, UA Negative Negative   Bilirubin, UA Negative Negative   Urobilinogen, Ur 1.0 0.2 - 1.0 mg/dL   Nitrite, UA Negative Negative   Microscopic Examination Comment     Comment: Microscopic follows if indicated.      BMET No results for input(s): NA, K, CL, CO2, GLUCOSE, BUN, CREATININE, CALCIUM in the last 72 hours. PSA PSA  Date Value Ref Range Status  12/02/2015 1.12 <=4.00 ng/mL Final    Comment:    Test Methodology: ECLIA PSA (Electrochemiluminescence Immunoassay)   For PSA values from 2.5-4.0, particularly in younger men <70 years old, the AUA and NCCN suggest testing for % Free PSA (3515) and evaluation of the rate of increase in PSA (PSA velocity).    Lab Results  Component Value Date   PSA1 2.0 01/19/2021       Studies/Results: Results for orders placed or performed in visit on 07/27/21 (from the past 79 hour(s))  Urinalysis, Routine w reflex microscopic     Status: Abnormal   Collection Time: 07/27/21  3:24 PM  Result Value Ref Range   Specific Gravity, UA >1.030 (  H) 1.005 - 1.030   pH, UA 5.5 5.0 - 7.5   Color, UA Yellow Yellow   Appearance Ur Clear Clear   Leukocytes,UA Negative Negative   Protein,UA Trace (A) Negative/Trace   Glucose, UA Negative Negative   Ketones, UA Trace (A) Negative   RBC, UA Negative Negative   Bilirubin, UA Negative Negative   Urobilinogen, Ur 1.0 0.2 - 1.0 mg/dL   Nitrite, UA Negative Negative   Microscopic Examination Comment     Comment: Microscopic follows if indicated.        Assessment & Plan: BPH with BOO.  He remains on alfuzosin and tadalafil but he has moderate LUTS.  His PSA  in March with 2.0 with a 25.5% f/t.   I will get a repeat PSA today since it has been rising slowly.  He may be interested in the [facility].    I will get a prostate volume study but he didn't want to schedule a flow rate and cystoscopy yet.   ED.  He is better with the tadalafil.      Ejaculatory dysfunction.  This could be from the trazodone or lyrica but he is not interested in stopping those.   Alfuzosin can cause reduced emission but it shouldn't prevent ejaculation.   Meds ordered this encounter  Medications   tadalafil (CIALIS) 5 MG tablet    Sig: Take 1 tablet (5 mg total) by mouth daily.    Dispense:  90 tablet    Refill:  3   alfuzosin (UROXATRAL) 10 MG 24 hr tablet    Sig: Take 1 tablet (10 mg total) by mouth daily with breakfast.    Dispense:  90 tablet    Refill:  3      Orders Placed This Encounter  Procedures   [place] Transrectal Complete    Standing Status:   Future    Standing Expiration Date:   07/27/2022    Order Specific Question:   Reason for Exam (SYMPTOM  OR DIAGNOSIS REQUIRED)    Answer:   BPH with BOO and need to assess prostate volume and anatomy.    Order Specific Question:   Preferred imaging location?    Answer:   [facility]   Urinalysis, Routine w reflex microscopic   PSA, total and free      Return in about 6 months (around 01/25/2022).   CC: Burdine, Virgina Evener, MD      Irine Seal 07/27/2021 Patient ID: Alfred Jones, male   DOB: 1951-03-10, 70 y.o.   MRN: [id number]

## 2021-07-28 LAB — PSA, TOTAL AND FREE
PSA, Free Pct: 25 %
PSA, Free: 0.55 ng/mL
Prostate Specific Ag, Serum: 2.2 ng/mL (ref 0.0–4.0)

## 2021-07-31 ENCOUNTER — Telehealth: Payer: Self-pay

## 2021-07-31 NOTE — Telephone Encounter (Signed)
Called and lvm for pt call us back.

## 2021-07-31 NOTE — Telephone Encounter (Signed)
-----   Message from Irine Seal, MD sent at 07/28/2021  5:23 PM EDT ----- His PSA has creeped up just a bit more.   I would like to see what his prostate volume study shows and depending on that, I may want to consider the [facility] test on him.    ----- Message ----- From: Iris Pert, LPN Sent: 50/04/2256  10:07 AM EDT To: Irine Seal, MD  Please review

## 2021-08-01 DIAGNOSIS — H2513 Age-related nuclear cataract, bilateral: Secondary | ICD-10-CM | POA: Diagnosis not present

## 2021-08-01 DIAGNOSIS — H2512 Age-related nuclear cataract, left eye: Secondary | ICD-10-CM | POA: Diagnosis not present

## 2021-08-01 DIAGNOSIS — H18413 Arcus senilis, bilateral: Secondary | ICD-10-CM | POA: Diagnosis not present

## 2021-08-01 DIAGNOSIS — H25043 Posterior subcapsular polar age-related cataract, bilateral: Secondary | ICD-10-CM | POA: Diagnosis not present

## 2021-08-01 DIAGNOSIS — H25013 Cortical age-related cataract, bilateral: Secondary | ICD-10-CM | POA: Diagnosis not present

## 2021-08-01 NOTE — Telephone Encounter (Signed)
Patient called the office and left a voice mail message, returning a call from clinical staff.  Please call him to advise.

## 2021-08-01 NOTE — Telephone Encounter (Signed)
Left detailed message of Dr. Jeffie Pollock notification of results and to return call to office if any questions.

## 2021-08-15 ENCOUNTER — Ambulatory Visit (HOSPITAL_COMMUNITY)
Admission: RE | Admit: 2021-08-15 | Discharge: 2021-08-15 | Disposition: A | Discharge status: Home / Self Care | Payer: Medicare Other | Source: Ambulatory Visit | Attending: Urology | Admitting: Urology

## 2021-08-15 ENCOUNTER — Other Ambulatory Visit: Payer: Self-pay

## 2021-08-15 DIAGNOSIS — N401 Enlarged prostate with lower urinary tract symptoms: Secondary | ICD-10-CM | POA: Insufficient documentation

## 2021-08-15 DIAGNOSIS — N138 Other obstructive and reflux uropathy: Secondary | ICD-10-CM | POA: Insufficient documentation

## 2021-08-23 DIAGNOSIS — Z6832 Body mass index (BMI) 32.0-32.9, adult: Secondary | ICD-10-CM | POA: Diagnosis not present

## 2021-08-23 DIAGNOSIS — L4050 Arthropathic psoriasis, unspecified: Secondary | ICD-10-CM | POA: Diagnosis not present

## 2021-08-23 DIAGNOSIS — M1712 Unilateral primary osteoarthritis, left knee: Secondary | ICD-10-CM | POA: Diagnosis not present

## 2021-08-24 ENCOUNTER — Telehealth: Payer: Self-pay

## 2021-08-24 NOTE — Telephone Encounter (Signed)
Patient had [place] Transrectal Complete done 10/25. He does not have a f/u scheduled until 01/25/22. Are you going to call with results or should he be scheduled to come into office? Patient can be called @ [phone]

## 2021-08-24 NOTE — Telephone Encounter (Signed)
Patient called and made aware. Appointment scheduled.

## 2021-09-05 DIAGNOSIS — R5383 Other fatigue: Secondary | ICD-10-CM | POA: Diagnosis not present

## 2021-09-05 DIAGNOSIS — D473 Essential (hemorrhagic) thrombocythemia: Secondary | ICD-10-CM | POA: Diagnosis not present

## 2021-09-05 DIAGNOSIS — Z6830 Body mass index (BMI) 30.0-30.9, adult: Secondary | ICD-10-CM | POA: Diagnosis not present

## 2021-09-05 DIAGNOSIS — R683 Clubbing of fingers: Secondary | ICD-10-CM | POA: Diagnosis not present

## 2021-09-19 DIAGNOSIS — M5416 Radiculopathy, lumbar region: Secondary | ICD-10-CM | POA: Diagnosis not present

## 2021-09-20 DIAGNOSIS — R5383 Other fatigue: Secondary | ICD-10-CM | POA: Diagnosis not present

## 2021-09-20 DIAGNOSIS — I700 Atherosclerosis of aorta: Secondary | ICD-10-CM | POA: Diagnosis not present

## 2021-09-20 DIAGNOSIS — J61 Pneumoconiosis due to asbestos and other mineral fibers: Secondary | ICD-10-CM | POA: Diagnosis not present

## 2021-09-20 DIAGNOSIS — R683 Clubbing of fingers: Secondary | ICD-10-CM | POA: Diagnosis not present

## 2021-09-20 DIAGNOSIS — Z77090 Contact with and (suspected) exposure to asbestos: Secondary | ICD-10-CM | POA: Diagnosis not present

## 2021-09-20 DIAGNOSIS — I2510 Atherosclerotic heart disease of native coronary artery without angina pectoris: Secondary | ICD-10-CM | POA: Diagnosis not present

## 2021-09-20 DIAGNOSIS — D473 Essential (hemorrhagic) thrombocythemia: Secondary | ICD-10-CM | POA: Diagnosis not present

## 2021-09-28 DIAGNOSIS — Z23 Encounter for immunization: Secondary | ICD-10-CM | POA: Diagnosis not present

## 2021-10-05 DIAGNOSIS — D473 Essential (hemorrhagic) thrombocythemia: Secondary | ICD-10-CM | POA: Diagnosis not present

## 2021-10-05 DIAGNOSIS — Z09 Encounter for follow-up examination after completed treatment for conditions other than malignant neoplasm: Secondary | ICD-10-CM | POA: Diagnosis not present

## 2021-10-05 DIAGNOSIS — I7381 Erythromelalgia: Secondary | ICD-10-CM | POA: Diagnosis not present

## 2021-10-05 DIAGNOSIS — D75839 Thrombocytosis, unspecified: Secondary | ICD-10-CM | POA: Diagnosis not present

## 2021-10-05 DIAGNOSIS — E559 Vitamin D deficiency, unspecified: Secondary | ICD-10-CM | POA: Diagnosis not present

## 2021-10-05 DIAGNOSIS — R5383 Other fatigue: Secondary | ICD-10-CM | POA: Diagnosis not present

## 2021-10-12 DIAGNOSIS — D473 Essential (hemorrhagic) thrombocythemia: Secondary | ICD-10-CM | POA: Diagnosis not present

## 2021-10-12 DIAGNOSIS — Z6830 Body mass index (BMI) 30.0-30.9, adult: Secondary | ICD-10-CM | POA: Diagnosis not present

## 2021-10-12 DIAGNOSIS — R5383 Other fatigue: Secondary | ICD-10-CM | POA: Diagnosis not present

## 2021-10-22 HISTORY — PX: CATARACT EXTRACTION: SUR2

## 2021-10-22 HISTORY — PX: EYE SURGERY: SHX253

## 2021-10-30 DIAGNOSIS — H52202 Unspecified astigmatism, left eye: Secondary | ICD-10-CM | POA: Diagnosis not present

## 2021-10-30 DIAGNOSIS — H2512 Age-related nuclear cataract, left eye: Secondary | ICD-10-CM | POA: Diagnosis not present

## 2021-10-31 DIAGNOSIS — H2511 Age-related nuclear cataract, right eye: Secondary | ICD-10-CM | POA: Diagnosis not present

## 2021-11-06 DIAGNOSIS — H43392 Other vitreous opacities, left eye: Secondary | ICD-10-CM | POA: Diagnosis not present

## 2021-11-06 DIAGNOSIS — H3562 Retinal hemorrhage, left eye: Secondary | ICD-10-CM | POA: Diagnosis not present

## 2021-11-06 DIAGNOSIS — H4312 Vitreous hemorrhage, left eye: Secondary | ICD-10-CM | POA: Diagnosis not present

## 2021-11-06 DIAGNOSIS — H43812 Vitreous degeneration, left eye: Secondary | ICD-10-CM | POA: Diagnosis not present

## 2021-11-06 DIAGNOSIS — H33312 Horseshoe tear of retina without detachment, left eye: Secondary | ICD-10-CM | POA: Diagnosis not present

## 2021-11-06 DIAGNOSIS — H43821 Vitreomacular adhesion, right eye: Secondary | ICD-10-CM | POA: Diagnosis not present

## 2021-11-09 ENCOUNTER — Other Ambulatory Visit: Payer: Self-pay

## 2021-11-09 ENCOUNTER — Encounter: Payer: Self-pay | Admitting: Urology

## 2021-11-09 ENCOUNTER — Ambulatory Visit (INDEPENDENT_AMBULATORY_CARE_PROVIDER_SITE_OTHER): Payer: Medicare Other | Admitting: Urology

## 2021-11-09 VITALS — BP 145/84 | HR 76 | Wt 218.0 lb

## 2021-11-09 DIAGNOSIS — N138 Other obstructive and reflux uropathy: Secondary | ICD-10-CM

## 2021-11-09 DIAGNOSIS — N5201 Erectile dysfunction due to arterial insufficiency: Secondary | ICD-10-CM | POA: Diagnosis not present

## 2021-11-09 DIAGNOSIS — N401 Enlarged prostate with lower urinary tract symptoms: Secondary | ICD-10-CM

## 2021-11-09 DIAGNOSIS — R3912 Poor urinary stream: Secondary | ICD-10-CM

## 2021-11-09 LAB — URINALYSIS, ROUTINE W REFLEX MICROSCOPIC
Bilirubin, UA: NEGATIVE
Glucose, UA: NEGATIVE
Ketones, UA: NEGATIVE
Leukocytes,UA: NEGATIVE
Nitrite, UA: NEGATIVE
Protein,UA: NEGATIVE
RBC, UA: NEGATIVE
Specific Gravity, UA: 1.02 (ref 1.005–1.030)
Urobilinogen, Ur: 4 mg/dL — ABNORMAL HIGH (ref 0.2–1.0)
pH, UA: 7 (ref 5.0–7.5)

## 2021-11-09 LAB — BLADDER SCAN AMB NON-IMAGING: Scan Result: 28

## 2021-11-09 MED ORDER — CIPROFLOXACIN HCL 500 MG PO TABS
500.0000 mg | ORAL_TABLET | Freq: Once | ORAL | Status: AC
Start: 2021-11-09 — End: 2021-11-09
  Administered 2021-11-09: 500 mg via ORAL

## 2021-11-09 NOTE — Progress Notes (Signed)
Subjective:  1. BPH with urinary obstruction   2. Weak urinary stream   3. Erectile dysfunction due to arterial insufficiency      Mr. Carline returns today in f/u for a 6 month f/u for  his history of BPH with BOO and ED.  His PSA is 2 with a 25%f/t ratio.  I was 1.8 in 8/20, 1.7 in 10/19 and 1.02 in 2017.  He was given tadalafil for the BPH and BOO as well as the ED on 06/05/19 and additional sildenafil to use with the tadalafil for the ED has so so results with that.  He has delayed ejaculation.   He remains on alfuzosin at this time. He is no longer on cymbalta. He has stable LUTS with an  IPSS of 17. He has a reduced stream unless he has a full bladder.  He still has some intermittency. He has occasional hesitancy. He has nocturia x 1.  He has no dysuria or hematuria.Marland Kitchen  His testosterone level was 450 at last check.    He had constipation and had a KUB on 07/11/20.  No stones were seen.    He has a new diagnosis of thrombocytosis and he is on Hydroxyurea.     07/27/21: Taher returns today in f/u for his history as noted above.  He remains on tadalafil and alfuzosin for ED and BPH.  His IPSS is 10 with a reduced stream.  He has difficulty reaching a climax still.  He remains on trazadone and lyrica. He had previously come off of the cymbalta.   11/09/21: Herbie Baltimore returns today for voiding studies and cystoscopy.  His prostate volume was 42ml on the TRUS in 10/22.  He has persistent LUTS with a reduced stream and intermittency.  His PF is 53ml/sec post cystoscopy and his PVR is 71ml.  Cysto shows a 3cm prostate with bilobar hyperplasia.    ROS:  ROS:  A complete review of systems was performed.  All systems are negative except for pertinent findings as noted.   ROS  No Known Allergies  Outpatient Encounter Medications as of 11/09/2021  Medication Sig   acetaminophen (TYLENOL) 500 MG tablet Take 1,000 mg by mouth as needed for mild pain.   alfuzosin (UROXATRAL) 10 MG 24 hr tablet Take 1 tablet  (10 mg total) by mouth daily with breakfast.   aspirin 325 MG tablet Take 325 mg by mouth daily.   aspirin EC 81 MG tablet Take 81 mg by mouth daily. Swallow whole.   celecoxib (CELEBREX) 100 MG capsule Take 200 mg by mouth 2 (two) times daily.   celecoxib (CELEBREX) 200 MG capsule    CETIRIZINE HCL PO Take 2 tablets by mouth daily.   Cholecalciferol (VITAMIN D3) 250 MCG (10000 UT) capsule Take 10,000 Units by mouth daily.   CINNAMON PO Take 1,200 mg by mouth daily.   clotrimazole (LOTRIMIN) 1 % cream Apply 1 application topically at bedtime. Feet   Co-Enzyme Q-10 100 MG CAPS Take 100 mg by mouth daily.   diclofenac Sodium (VOLTAREN) 1 % GEL Apply 2 g topically at bedtime. Joint pain   docusate sodium (COLACE) 100 MG capsule Take 100 mg by mouth daily.   FIBER PO Take by mouth daily. Natural fibers found in fruits and vegetables.   Flaxseed, Linseed, (FLAX SEED OIL) 1000 MG CAPS Take by mouth.   hydroxyurea (HYDREA) 500 MG capsule Take 500 mg by mouth daily. May take with food to minimize GI side effects.   ketoconazole (NIZORAL)  2 % cream Apply 1 application topically daily.   linaclotide (LINZESS) 290 MCG CAPS capsule Take 290 mcg by mouth daily before breakfast.   Melatonin 10 MG CAPS Take 10 mg by mouth at bedtime.    methocarbamol (ROBAXIN) 500 MG tablet Take 500 mg by mouth daily.   Multiple Vitamins-Minerals (MULTIVITAMIN MEN 50+ PO) Take by mouth daily.   OVER THE COUNTER MEDICATION Take 1,000 mg by mouth daily. Beet root   Oxycodone HCl 10 MG TABS Take by mouth.   oxyCODONE-acetaminophen (PERCOCET) 10-325 MG tablet Take 1 tablet by mouth every 4 (four) hours as needed for pain.   pantoprazole (PROTONIX) 40 MG tablet Take 1 tablet by mouth daily.   polyethylene glycol (MIRALAX / GLYCOLAX) 17 g packet Take 17 g by mouth daily as needed.   pregabalin (LYRICA) 100 MG capsule Take 100 mg by mouth daily.   pregabalin (LYRICA) 50 MG capsule Take 50-100 mg by mouth at bedtime.    rosuvastatin (CRESTOR) 5 MG tablet Take 5 mg by mouth 2 (two) times a week.   tadalafil (CIALIS) 5 MG tablet Take 1 tablet (5 mg total) by mouth daily.   Tart Cherry 1200 MG CAPS Take by mouth.   traZODone (DESYREL) 50 MG tablet Take 50 mg by mouth at bedtime.   TURMERIC PO Take by mouth at bedtime.   [EXPIRED] ciprofloxacin (CIPRO) tablet 500 mg    No facility-administered encounter medications on file as of 11/09/2021.    Past Medical History:  Diagnosis Date   Acid reflux    Anxiety    Arthritis    RHEUMATOID OR PSORIATIC ?   BCC (basal cell carcinoma of skin) 06/19/2007   left lower chest (CX35FU)   BCC (basal cell carcinoma of skin) infiltrated 03/11/2018   right nose (MOHS)   BCC (basal cell carcinoma of skin) ulcerated 07/11/2010   left cheek    Cancer ([place])    Depression    Enlarged prostate    Hyperlipidemia    Numbness    Sleep apnea    TESTED BACK IN 2017   Superficial basal cell carcinoma (BCC) 03/11/2018   left shoulder     Past Surgical History:  Procedure Laterality Date   ANTERIOR CERVICAL DECOMP/DISCECTOMY FUSION N/A 09/23/2018   Procedure: Cervical Three-Four Cervical Four-Five Cervical Five-Six Anterior cervical decompression/discectomy/fusion;  Surgeon: Erline Levine, MD;  Location: [place];  Service: Neurosurgery;  Laterality: N/A;  Cervical Three-Four Cervical Four-Five Cervical Five-Six Anterior cervical decompression/discectomy/fusion   CARDIAC CATHETERIZATION     20 YRS AGO AT [facility]   COLONOSCOPY WITH PROPOFOL N/A 06/23/2020   One small polyp at hepatic flexure that was sessile and ablated without specimen for pathologist. Surveillance 10 years if overall health permits.   ESOPHAGOGASTRODUODENOSCOPY (EGD) WITH PROPOFOL N/A 08/13/2019   prominent Schatzki ring s/p dilation, overlying erosive reflux esophagitis, small hiatal hernia   EYE SURGERY     METAL REMOVED FROM LEFT EYE  (IN OFFICE)   MALONEY DILATION N/A 08/13/2019   Procedure: Venia Minks  DILATION;  Surgeon: Daneil Dolin, MD;  Location: [facility];  Service: Endoscopy;  Laterality: N/A;   MOLE REMOVAL     POLYPECTOMY  06/23/2020   Procedure: POLYPECTOMY;  Surgeon: Daneil Dolin, MD;  Location: [facility];  Service: Endoscopy;;   TONSILLECTOMY     as a child    Social History   Socioeconomic History   Marital status: Married    Spouse name: Not on file  Number of children: 2   Years of education: 12   Highest education level: Not on file  Occupational History   Occupation: Dealer  Tobacco Use   Smoking status: Former    Packs/day: 1.00    Years: 16.00    Pack years: 16.00    Types: Cigarettes    Quit date: 10/22/1986    Years since quitting: 35.0   Smokeless tobacco: Never  Vaping Use   Vaping Use: Never used  Substance and Sexual Activity   Alcohol use: No   Drug use: No   Sexual activity: Not on file  Other Topics Concern   Not on file  Social History Narrative   Lives at home with his wife and son.   Right-handed.   2-4 cups caffeine per day.   Social Determinants of Health   Financial Resource Strain: Not on file  Food Insecurity: Not on file  Transportation Needs: Not on file  Physical Activity: Not on file  Stress: Not on file  Social Connections: Not on file  Intimate Partner Violence: Not on file    Family History  Problem Relation Age of Onset   Heart disease Mother    Diabetes Mother    Heart attack Mother    Heart disease Father    Diabetes Father    Diabetes Sister    Seizures Sister    Diabetes Brother    Heart attack Brother    Sleep apnea Brother    Heart attack Brother    Sleep apnea Brother    Heart attack Brother    Diabetes Brother    Heart attack Brother    Hypothyroidism Son    Hypothyroidism Daughter    Colon cancer Neg Hx        Objective: Vitals:   11/09/21 1324  BP: (!) 145/84  Pulse: 76     Physical Exam  Lab Results:  Recent Results (from the past 2160 hour(s))  BLADDER SCAN  AMB NON-IMAGING     Status: None   Collection Time: 11/09/21  1:26 PM  Result Value Ref Range   Scan Result 28   Urinalysis, Routine w reflex microscopic     Status: Abnormal   Collection Time: 11/09/21  1:28 PM  Result Value Ref Range   Specific Gravity, UA 1.020 1.005 - 1.030   pH, UA 7.0 5.0 - 7.5   Color, UA Yellow Yellow   Appearance Ur Clear Clear   Leukocytes,UA Negative Negative   Protein,UA Negative Negative/Trace   Glucose, UA Negative Negative   Ketones, UA Negative Negative   RBC, UA Negative Negative   Bilirubin, UA Negative Negative   Urobilinogen, Ur 4.0 (H) 0.2 - 1.0 mg/dL   Nitrite, UA Negative Negative   Microscopic Examination Comment     Comment: Microscopic follows if indicated.      BMET No results for input(s): NA, K, CL, CO2, GLUCOSE, BUN, CREATININE, CALCIUM in the last 72 hours. PSA PSA  Date Value Ref Range Status  12/02/2015 1.12 <=4.00 ng/mL Final    Comment:    Test Methodology: ECLIA PSA (Electrochemiluminescence Immunoassay)   For PSA values from 2.5-4.0, particularly in younger men <51 years old, the AUA and NCCN suggest testing for % Free PSA (3515) and evaluation of the rate of increase in PSA (PSA velocity).    Lab Results  Component Value Date   PSA1 2.2 07/27/2021   PSA1 2.0 01/19/2021   UA is clear.     Studies/Results: Results for  orders placed or performed in visit on 11/09/21 (from the past 48 hour(s))  Urinalysis, Routine w reflex microscopic     Status: Abnormal   Collection Time: 11/09/21  1:28 PM  Result Value Ref Range   Specific Gravity, UA 1.020 1.005 - 1.030   pH, UA 7.0 5.0 - 7.5   Color, UA Yellow Yellow   Appearance Ur Clear Clear   Leukocytes,UA Negative Negative   Protein,UA Negative Negative/Trace   Glucose, UA Negative Negative   Ketones, UA Negative Negative   RBC, UA Negative Negative   Bilirubin, UA Negative Negative   Urobilinogen, Ur 4.0 (H) 0.2 - 1.0 mg/dL   Nitrite, UA Negative Negative    Microscopic Examination Comment     Comment: Microscopic follows if indicated.   08/15/21 Prostate [place]:  Prostate volume 50ml without a middle lobe.  The anatomic zones are well defined and there are no hypoechoic areas and only scant calcium deposits.    Procedure: Flexible cystoscopy.  He was prepped with betadine and 2% lidocaine jelly.  Cipro 500mg  po was given.  The scope was easily passed.  The urethra is normal. The prostate is about 3cm with bilobar hyperplasia with coaptation and no middle lobe.  There is mild trabeculation without mucosal lesions and the UO's are normal.  There were no complications.      Assessment & Plan: BPH with BOO.  He remains on alfuzosin and tadalafil but he has moderate LUTS.  His PSA was 2.2 with a 25% f/t ratio on repeat in October and was 2.0 with a 25.5% f/t in 3/22.  It was 1.12 in 2017.   He has a 43ml prostate with bilobar hyperplasia on cystoscopy.  He has a reduced stream but empties well.  He is interested in the Urolift procedure but has a lot going on with his family and other medical issues so I will just have him return in 4-6 to see Dr. Alyson Ingles who can get him set up at [facility] for the procedure.  He will stay on current meds.   ED.  He is better with the tadalafil.      Ejaculatory dysfunction.  Stable.  Meds ordered this encounter  Medications   ciprofloxacin (CIPRO) tablet 500 mg      Orders Placed This Encounter  Procedures   Urinalysis, Routine w reflex microscopic   PR COMPLEX UROFLOWMETRY   BLADDER SCAN AMB NON-IMAGING      Return for f/u in 4-6 weeks with Dr. Alyson Ingles to discuss Urolift. .   CC: Burdine, Virgina Evener, MD      Irine Seal 11/09/2021 Patient ID: Marella Chimes, male   DOB: October 05, 1951, 71 y.o.   MRN: [id number] Patient ID: JOSEEDUARDO BRIX, male   DOB: [birth year], 71 y.o.   MRN: [id number]

## 2021-11-09 NOTE — Progress Notes (Signed)
Uroflow- pre cystoscopy  Peak Flow: 11ml Average Flow: 65ml Voided Volume: 11ml Voiding Time: 17sec Flow Time: 7sec Time to Peak Flow: 8sec  PVR Volume: 17ml   Uroflow- post cystoscopy  Peak Flow: 32ml Average Flow: 23ml Voided Volume: 247ml Voiding Time: 38sec Flow Time: 37sec Time to Peak Flow: 7sec    Urological Symptom Review    Patient is experiencing the following symptoms: Hard to postpone urination Stream starts and stops Trouble starting stream Weak stream Erection problems (male only)   Review of Systems  Gastrointestinal (upper)  : Negative for upper GI symptoms  Gastrointestinal (lower) : Constipation  Constitutional : Fatigue  Skin: Negative for skin symptoms  Eyes: Blurred vision  Ear/Nose/Throat : Negative for Ear/Nose/Throat symptoms  Hematologic/Lymphatic: Negative for Hematologic/Lymphatic symptoms  Cardiovascular : Negative for cardiovascular symptoms  Respiratory : Negative for respiratory symptoms  Endocrine: Negative for endocrine symptoms  Musculoskeletal: Back pain Joint pain  Neurological: Negative for neurological symptoms  Psychologic: Negative for psychiatric symptoms

## 2021-11-10 DIAGNOSIS — H31092 Other chorioretinal scars, left eye: Secondary | ICD-10-CM | POA: Diagnosis not present

## 2021-11-10 DIAGNOSIS — H43392 Other vitreous opacities, left eye: Secondary | ICD-10-CM | POA: Diagnosis not present

## 2021-11-16 ENCOUNTER — Other Ambulatory Visit: Payer: Medicare Other | Admitting: Urology

## 2021-11-28 DIAGNOSIS — Z1329 Encounter for screening for other suspected endocrine disorder: Secondary | ICD-10-CM | POA: Diagnosis not present

## 2021-11-28 DIAGNOSIS — R030 Elevated blood-pressure reading, without diagnosis of hypertension: Secondary | ICD-10-CM | POA: Diagnosis not present

## 2021-11-28 DIAGNOSIS — E7849 Other hyperlipidemia: Secondary | ICD-10-CM | POA: Diagnosis not present

## 2021-11-28 DIAGNOSIS — K219 Gastro-esophageal reflux disease without esophagitis: Secondary | ICD-10-CM | POA: Diagnosis not present

## 2021-11-28 DIAGNOSIS — E559 Vitamin D deficiency, unspecified: Secondary | ICD-10-CM | POA: Diagnosis not present

## 2021-11-28 DIAGNOSIS — E782 Mixed hyperlipidemia: Secondary | ICD-10-CM | POA: Diagnosis not present

## 2021-12-05 DIAGNOSIS — K5909 Other constipation: Secondary | ICD-10-CM | POA: Diagnosis not present

## 2021-12-05 DIAGNOSIS — M2550 Pain in unspecified joint: Secondary | ICD-10-CM | POA: Diagnosis not present

## 2021-12-05 DIAGNOSIS — R7989 Other specified abnormal findings of blood chemistry: Secondary | ICD-10-CM | POA: Diagnosis not present

## 2021-12-05 DIAGNOSIS — L4050 Arthropathic psoriasis, unspecified: Secondary | ICD-10-CM | POA: Diagnosis not present

## 2021-12-05 DIAGNOSIS — M4712 Other spondylosis with myelopathy, cervical region: Secondary | ICD-10-CM | POA: Diagnosis not present

## 2021-12-05 DIAGNOSIS — D7589 Other specified diseases of blood and blood-forming organs: Secondary | ICD-10-CM | POA: Diagnosis not present

## 2021-12-05 DIAGNOSIS — I7381 Erythromelalgia: Secondary | ICD-10-CM | POA: Diagnosis not present

## 2021-12-05 DIAGNOSIS — D473 Essential (hemorrhagic) thrombocythemia: Secondary | ICD-10-CM | POA: Diagnosis not present

## 2021-12-11 DIAGNOSIS — D75839 Thrombocytosis, unspecified: Secondary | ICD-10-CM | POA: Diagnosis not present

## 2021-12-11 DIAGNOSIS — D473 Essential (hemorrhagic) thrombocythemia: Secondary | ICD-10-CM | POA: Diagnosis not present

## 2021-12-11 DIAGNOSIS — R5383 Other fatigue: Secondary | ICD-10-CM | POA: Diagnosis not present

## 2021-12-11 DIAGNOSIS — I7381 Erythromelalgia: Secondary | ICD-10-CM | POA: Diagnosis not present

## 2021-12-11 DIAGNOSIS — E559 Vitamin D deficiency, unspecified: Secondary | ICD-10-CM | POA: Diagnosis not present

## 2021-12-11 DIAGNOSIS — Z09 Encounter for follow-up examination after completed treatment for conditions other than malignant neoplasm: Secondary | ICD-10-CM | POA: Diagnosis not present

## 2021-12-19 DIAGNOSIS — D473 Essential (hemorrhagic) thrombocythemia: Secondary | ICD-10-CM | POA: Diagnosis not present

## 2021-12-19 DIAGNOSIS — Z6831 Body mass index (BMI) 31.0-31.9, adult: Secondary | ICD-10-CM | POA: Diagnosis not present

## 2021-12-19 DIAGNOSIS — R5383 Other fatigue: Secondary | ICD-10-CM | POA: Diagnosis not present

## 2021-12-20 NOTE — Progress Notes (Unsigned)
Office Visit Note  Patient: Alfred Jones             Date of Birth: Nov 09, 1950           MRN: [id number]             PCP: Curlene Labrum, MD Referring: Curlene Labrum, MD Visit Date: 01/02/2022 Occupation: @GUAROCC @  Subjective:    History of Present Illness: Alfred Jones is a 71 y.o. male with history of osteoarthritis and DDD.   Celebrex   Activities of Daily Living:  Patient reports morning stiffness for *** {minute/hour:19697}.   Patient {ACTIONS;DENIES/REPORTS:21021675::"Denies"} nocturnal pain.  Difficulty dressing/grooming: {ACTIONS;DENIES/REPORTS:21021675::"Denies"} Difficulty climbing stairs: {ACTIONS;DENIES/REPORTS:21021675::"Denies"} Difficulty getting out of chair: {ACTIONS;DENIES/REPORTS:21021675::"Denies"} Difficulty using hands for taps, buttons, cutlery, and/or writing: {ACTIONS;DENIES/REPORTS:21021675::"Denies"}  No Rheumatology ROS completed.   PMFS History:  Patient Active Problem List   Diagnosis Date Noted   Hematochezia 05/17/2020   Chemotherapy follow-up examination 01/27/2020   Essential thrombocythemia ([place]) 01/27/2020   Erythromelalgia ([place]) 01/04/2020   Psoriasis 12/13/2019   Family history of ischemic heart disease 04/28/2019   Impotence of organic origin 04/28/2019   Pain in joint, multiple sites 04/28/2019   Occlusion and stenosis of carotid artery 04/28/2019   Other specified disorders of male genital organs 04/28/2019   Psoriatic arthropathy ([place]) 04/28/2019   Vitamin D deficiency 04/28/2019   Pain in joint 03/25/2019   Myalgia and myositis 03/25/2019   Other, multiple, and unspecified sites, insect bite, nonvenomous 03/25/2019   Herniated cervical disc without myelopathy 09/23/2018   Cervical spinal stenosis 06/02/2018   Gait abnormality 05/01/2018   Spinal stenosis of lumbar region 05/01/2018   Chronic right-sided low back pain with right-sided sciatica 04/15/2018   Right groin pain 04/15/2018   Edema 06/04/2017    Arthropathy, multiple sites 03/15/2017   Acute upper respiratory infection 12/12/2016   Wheezing 12/12/2016   Other malaise and fatigue 11/04/2016   Paresthesia 09/18/2016   Mononeuritis 09/04/2016   Disturbance of skin sensation 09/03/2016   Pain in soft tissues of limb 09/03/2016   Other atopic dermatitis and related conditions 07/22/2016   Sleep apnea 07/17/2016   Other dyspnea and respiratory abnormality 04/08/2016   Hyperlipidemia 12/06/2015   BPH (benign prostatic hyperplasia) 12/06/2015   Depression 12/06/2015   Obesity, unspecified 12/06/2015   GERD 08/31/2010   Constipation 08/31/2010   DYSPHAGIA 08/31/2010   ABDOMINAL PAIN-MULTIPLE SITES 08/31/2010    Past Medical History:  Diagnosis Date   Acid reflux    Anxiety    Arthritis    RHEUMATOID OR PSORIATIC ?   BCC (basal cell carcinoma of skin) 06/19/2007   left lower chest (CX35FU)   BCC (basal cell carcinoma of skin) infiltrated 03/11/2018   right nose (MOHS)   BCC (basal cell carcinoma of skin) ulcerated 07/11/2010   left cheek    Cancer ([place])    Depression    Enlarged prostate    Hyperlipidemia    Numbness    Sleep apnea    TESTED BACK IN 2017   Superficial basal cell carcinoma (BCC) 03/11/2018   left shoulder     Family History  Problem Relation Age of Onset   Heart disease Mother    Diabetes Mother    Heart attack Mother    Heart disease Father    Diabetes Father    Diabetes Sister    Seizures Sister    Diabetes Brother    Heart attack Brother    Sleep apnea Brother  Heart attack Brother    Sleep apnea Brother    Heart attack Brother    Diabetes Brother    Heart attack Brother    Hypothyroidism Son    Hypothyroidism Daughter    Colon cancer Neg Hx    Past Surgical History:  Procedure Laterality Date   ANTERIOR CERVICAL DECOMP/DISCECTOMY FUSION N/A 09/23/2018   Procedure: Cervical Three-Four Cervical Four-Five Cervical Five-Six Anterior cervical decompression/discectomy/fusion;  Surgeon:  Erline Levine, MD;  Location: [facility];  Service: Neurosurgery;  Laterality: N/A;  Cervical Three-Four Cervical Four-Five Cervical Five-Six Anterior cervical decompression/discectomy/fusion   CARDIAC CATHETERIZATION     20 YRS AGO AT [facility]   COLONOSCOPY WITH PROPOFOL N/A 06/23/2020   One small polyp at hepatic flexure that was sessile and ablated without specimen for pathologist. Surveillance 10 years if overall health permits.   ESOPHAGOGASTRODUODENOSCOPY (EGD) WITH PROPOFOL N/A 08/13/2019   prominent Schatzki ring s/p dilation, overlying erosive reflux esophagitis, small hiatal hernia   EYE SURGERY     METAL REMOVED FROM LEFT EYE  (IN OFFICE)   MALONEY DILATION N/A 08/13/2019   Procedure: Venia Minks DILATION;  Surgeon: Daneil Dolin, MD;  [facility];  Service: Endoscopy;  Laterality: N/A;   MOLE REMOVAL     POLYPECTOMY  06/23/2020   Procedure: POLYPECTOMY;  Surgeon: Daneil Dolin, MD;  [facility];  Service: Endoscopy;;   TONSILLECTOMY     as a child   Social History   Social History Narrative   Lives at home with his wife and son.   Right-handed.   2-4 cups caffeine per day.   Immunization History  Administered Date(s) Administered   Influenza Split 07/26/2015   Tdap 10/05/2002     Objective: Vital Signs: There were no vitals taken for this visit.   Physical Exam Vitals and nursing note reviewed.  Constitutional:      Appearance: He is well-developed.  HENT:     Head: Normocephalic and atraumatic.  Eyes:     Conjunctiva/sclera: Conjunctivae normal.     Pupils: Pupils are equal, round, and reactive to light.  Cardiovascular:     Rate and Rhythm: Normal rate and regular rhythm.     Heart sounds: Normal heart sounds.  Pulmonary:     Effort: Pulmonary effort is normal.     Breath sounds: Normal breath sounds.  Abdominal:     General: Bowel sounds are normal.     Palpations: Abdomen is soft.  Musculoskeletal:     Cervical back: Normal range  of motion and neck supple.  Skin:    General: Skin is warm and dry.     Capillary Refill: Capillary refill takes less than 2 seconds.  Neurological:     Mental Status: He is alert and oriented to person, place, and time.  Psychiatric:        Behavior: Behavior normal.     Musculoskeletal Exam: ***  CDAI Exam: CDAI Score: -- Patient Global: --; Provider Global: -- Swollen: --; Tender: -- Joint Exam 01/02/2022   No joint exam has been documented for this visit   There is currently no information documented on the homunculus. Go to the Rheumatology activity and complete the homunculus joint exam.  Investigation: No additional findings.  Imaging: No results found.  Recent Labs: Lab Results  Component Value Date   WBC 5.6 12/08/2020   HGB 12.5 (L) 12/08/2020   PLT 272 12/08/2020   NA 142 12/08/2020   K 4.2 12/08/2020   CL  106 12/08/2020   CO2 30 12/08/2020   GLUCOSE 72 12/08/2020   BUN 18 12/08/2020   CREATININE 1.13 12/08/2020   BILITOT 0.6 12/08/2020   ALKPHOS 56 04/15/2018   AST 17 12/08/2020   ALT 15 12/08/2020   PROT 6.4 12/08/2020   ALBUMIN 4.5 04/15/2018   CALCIUM 8.6 12/08/2020   GFRAA 78 12/08/2020    Speciality Comments: Methotrexate 2 years 09/18 -no response  Otezla 1 year no response treated at [facility] rheumatology for possible psoriatic arthritis  Procedures:  No procedures performed Allergies: Patient has no known allergies.   Assessment / Plan:     Visit Diagnoses: No diagnosis found.  Orders: No orders of the defined types were placed in this encounter.  No orders of the defined types were placed in this encounter.   Face-to-face time spent with patient was *** minutes. Greater than 50% of time was spent in counseling and coordination of care.  Follow-Up Instructions: No follow-ups on file.   Earnestine Mealing, CMA  Note - This record has been created using Editor, commissioning.  Chart creation errors have been sought, but may not  always  have been located. Such creation errors do not reflect on  the standard of medical care.

## 2021-12-21 DIAGNOSIS — M5416 Radiculopathy, lumbar region: Secondary | ICD-10-CM | POA: Diagnosis not present

## 2021-12-22 ENCOUNTER — Ambulatory Visit: Payer: Medicare Other | Admitting: Urology

## 2021-12-22 DIAGNOSIS — R3912 Poor urinary stream: Secondary | ICD-10-CM

## 2021-12-22 DIAGNOSIS — N138 Other obstructive and reflux uropathy: Secondary | ICD-10-CM

## 2022-01-02 ENCOUNTER — Ambulatory Visit (INDEPENDENT_AMBULATORY_CARE_PROVIDER_SITE_OTHER): Payer: Medicare Other | Admitting: Physician Assistant

## 2022-01-02 ENCOUNTER — Ambulatory Visit (INDEPENDENT_AMBULATORY_CARE_PROVIDER_SITE_OTHER): Payer: Medicare Other

## 2022-01-02 ENCOUNTER — Ambulatory Visit: Payer: Medicare Other | Admitting: Rheumatology

## 2022-01-02 ENCOUNTER — Other Ambulatory Visit: Payer: Self-pay

## 2022-01-02 ENCOUNTER — Encounter: Payer: Self-pay | Admitting: Physician Assistant

## 2022-01-02 VITALS — BP 134/75 | HR 70 | Ht 70.0 in | Wt 228.6 lb

## 2022-01-02 DIAGNOSIS — M19041 Primary osteoarthritis, right hand: Secondary | ICD-10-CM

## 2022-01-02 DIAGNOSIS — M5030 Other cervical disc degeneration, unspecified cervical region: Secondary | ICD-10-CM

## 2022-01-02 DIAGNOSIS — Z79899 Other long term (current) drug therapy: Secondary | ICD-10-CM

## 2022-01-02 DIAGNOSIS — D0439 Carcinoma in situ of skin of other parts of face: Secondary | ICD-10-CM

## 2022-01-02 DIAGNOSIS — I7381 Erythromelalgia: Secondary | ICD-10-CM

## 2022-01-02 DIAGNOSIS — M5136 Other intervertebral disc degeneration, lumbar region: Secondary | ICD-10-CM

## 2022-01-02 DIAGNOSIS — Z8719 Personal history of other diseases of the digestive system: Secondary | ICD-10-CM | POA: Diagnosis not present

## 2022-01-02 DIAGNOSIS — R21 Rash and other nonspecific skin eruption: Secondary | ICD-10-CM

## 2022-01-02 DIAGNOSIS — E559 Vitamin D deficiency, unspecified: Secondary | ICD-10-CM

## 2022-01-02 DIAGNOSIS — Z840 Family history of diseases of the skin and subcutaneous tissue: Secondary | ICD-10-CM

## 2022-01-02 DIAGNOSIS — G8929 Other chronic pain: Secondary | ICD-10-CM

## 2022-01-02 DIAGNOSIS — M19072 Primary osteoarthritis, left ankle and foot: Secondary | ICD-10-CM

## 2022-01-02 DIAGNOSIS — D473 Essential (hemorrhagic) thrombocythemia: Secondary | ICD-10-CM | POA: Diagnosis not present

## 2022-01-02 DIAGNOSIS — M19071 Primary osteoarthritis, right ankle and foot: Secondary | ICD-10-CM

## 2022-01-02 DIAGNOSIS — G589 Mononeuropathy, unspecified: Secondary | ICD-10-CM | POA: Diagnosis not present

## 2022-01-02 DIAGNOSIS — M170 Bilateral primary osteoarthritis of knee: Secondary | ICD-10-CM

## 2022-01-02 DIAGNOSIS — Z8639 Personal history of other endocrine, nutritional and metabolic disease: Secondary | ICD-10-CM

## 2022-01-02 DIAGNOSIS — M79641 Pain in right hand: Secondary | ICD-10-CM | POA: Diagnosis not present

## 2022-01-02 DIAGNOSIS — M25562 Pain in left knee: Secondary | ICD-10-CM

## 2022-01-02 DIAGNOSIS — M79642 Pain in left hand: Secondary | ICD-10-CM

## 2022-01-02 DIAGNOSIS — M19042 Primary osteoarthritis, left hand: Secondary | ICD-10-CM

## 2022-01-02 MED ORDER — LIDOCAINE HCL 1 % IJ SOLN
1.5000 mL | INTRAMUSCULAR | Status: AC | PRN
  Administered 2022-01-02: 1.5 mL

## 2022-01-02 MED ORDER — TRIAMCINOLONE ACETONIDE 40 MG/ML IJ SUSP
40.0000 mg | INTRAMUSCULAR | Status: AC | PRN
  Administered 2022-01-02: 40 mg via INTRA_ARTICULAR

## 2022-01-05 DIAGNOSIS — H43812 Vitreous degeneration, left eye: Secondary | ICD-10-CM | POA: Diagnosis not present

## 2022-01-05 DIAGNOSIS — H25041 Posterior subcapsular polar age-related cataract, right eye: Secondary | ICD-10-CM | POA: Diagnosis not present

## 2022-01-05 DIAGNOSIS — H31092 Other chorioretinal scars, left eye: Secondary | ICD-10-CM | POA: Diagnosis not present

## 2022-01-05 DIAGNOSIS — H43392 Other vitreous opacities, left eye: Secondary | ICD-10-CM | POA: Diagnosis not present

## 2022-01-24 DIAGNOSIS — M5416 Radiculopathy, lumbar region: Secondary | ICD-10-CM | POA: Diagnosis not present

## 2022-01-25 ENCOUNTER — Ambulatory Visit: Payer: Medicare Other | Admitting: Urology

## 2022-02-05 ENCOUNTER — Ambulatory Visit (INDEPENDENT_AMBULATORY_CARE_PROVIDER_SITE_OTHER): Payer: Medicare Other | Admitting: Urology

## 2022-02-05 ENCOUNTER — Encounter: Payer: Self-pay | Admitting: Urology

## 2022-02-05 VITALS — BP 93/57 | HR 83

## 2022-02-05 DIAGNOSIS — N138 Other obstructive and reflux uropathy: Secondary | ICD-10-CM

## 2022-02-05 DIAGNOSIS — N401 Enlarged prostate with lower urinary tract symptoms: Secondary | ICD-10-CM

## 2022-02-05 DIAGNOSIS — R3912 Poor urinary stream: Secondary | ICD-10-CM | POA: Diagnosis not present

## 2022-02-05 NOTE — Patient Instructions (Signed)
Prostatic Urethral Lift  Prostatic urethral lift is a surgical procedure to treat symptoms of prostate gland enlargement that occurs with age (benign prostatic hypertrophy, BPH). The urethra passes between the two lobes of the prostate. The urethra is the part of the body that drains urine from the bladder. As the prostate enlarges, it can push on the urethra and cause problems with urinating. This procedure involves placing an implant that holds the prostate away from the urethra. The procedure is done using a thin device called a cystoscope. The device is inserted through the tip of the penis and moved up the urethra to the prostate. This is less invasive than other procedures that require an incision. You may have this procedure if: You have symptoms of BPH. Your prostate is not severely enlarged. Medicines to treat BPH are not working or not tolerated. You want to avoid possible sexual side effects from medicines or other procedures that are used to treat BPH. Tell a health care provider about: Any allergies you have. All medicines you are taking, including vitamins, herbs, eye drops, creams, and over-the-counter medicines. Any problems you or family members have had with anesthetic medicines. Any bleeding problems you have. Any surgeries you have had. Any medical conditions you have. What are the risks? Generally, this is a safe procedure. However, problems may occur, including: Bleeding. Infection. Leaking of urine (incontinence). Allergic reactions to medicines. Return of BPH symptoms after 2 years, requiring more treatment. What happens before the procedure? When to stop eating and drinking Follow instructions from your health care provider about what you may eat and drink before your procedure. These may include: 8 hours before your procedure Stop eating most foods. Do not eat meat, fried foods, or fatty foods. Eat only light foods, such as toast or crackers. All liquids are  okay except energy drinks and alcohol. 6 hours before your procedure Stop eating. Drink only clear liquids, such as water, clear fruit juice, black coffee, plain tea, and sports drinks. Do not drink energy drinks or alcohol. 2 hours before your procedure Stop drinking all liquids. You may be allowed to take medicines with small sips of water. If you do not follow your health care provider's instructions, your procedure may be delayed or canceled. Medicines Ask your health care provider about: Changing or stopping your regular medicines. This is especially important if you are taking diabetes medicines or blood thinners. Taking medicines such as aspirin and ibuprofen. These medicines can thin your blood. Do not take these medicines unless your health care provider tells you to take them. Taking over-the-counter medicines, vitamins, herbs, and supplements. Surgery safety Ask your health care provider what steps will be taken to help prevent infection. These steps may include: Removing hair at the surgery site. Washing skin with a germ-killing soap. Taking antibiotic medicine. General instructions Do not use any products that contain nicotine or tobacco for at least 4 weeks before the procedure. These products include cigarettes, chewing tobacco, and vaping devices, such as e-cigarettes. If you need help quitting, ask your health care provider. If you will be going home right after the procedure, plan to have a responsible adult: Take you home from the hospital or clinic. You will not be allowed to drive. Care for you for the time you are told. What happens during the procedure? An IV may be inserted into one of your veins. You will be given one or more of the following: A medicine to help you relax (sedative). A medicine that   is injected into your urethra to numb the area (local anesthetic). A medicine to make you fall asleep (general anesthetic). A cystoscope will be inserted into your  penis and moved through your urethra to your prostate. A device will be inserted through the cystoscope and used to press the lobes of your prostate away from your urethra. Implants will be inserted through the device to hold the lobes of your prostate in the widened position. The device and cystoscope will be removed. The procedure may vary among health care providers and hospitals. What happens after the procedure? Your blood pressure, heart rate, breathing rate, and blood oxygen level be monitored until you leave the hospital or clinic. If you were given a sedative during the procedure, it can affect you for several hours. Do not drive or operate machinery until your health care provider says that it is safe. Summary Prostatic urethral lift is a surgical procedure to relieve symptoms of prostate gland enlargement that occurs with age (benign prostatic hypertrophy, BPH). The procedure is performed with a thin device called a cystoscope. This device is inserted through the tip of the penis and moved up the urethra to reach the prostate. This is less invasive than other procedures that require an incision. If you will be going home right after the procedure, plan to have a responsible adult take you home from the hospital or clinic. You will not be allowed to drive. This information is not intended to replace advice given to you by your health care provider. Make sure you discuss any questions you have with your health care provider. Document Revised: 05/05/2021 Document Reviewed: 05/05/2021 Elsevier Patient Education  2023 Elsevier Inc.  

## 2022-02-05 NOTE — Progress Notes (Signed)
? ?02/05/2022 ?3:34 PM  ? ?Alfred Jones ?[birth year] ?[id number] ? ?Referring provider: Curlene Labrum, MD ?[address] ?[place],  [place] [address] ? ?Weak urinary stream  ? ? ?HPI: ?Alfred Jones is a 71yo here for BPH with weak urinary stream. IPSS 14 QOL 4. He has a weak urinary stream for the past 3 years. He has been on Uroxatral '10mg'$  qhs with slight improvement in his LUTS. Nocturia 1x. He has a feeling of incomplete. He underwent cystoscopy which showed lateral lobe hyperplasia with no median lobe. He has a 35cc prostate. NO other complaints today ? ? ?PMH: ?Past Medical History:  ?Diagnosis Date  ? Acid reflux   ? Anxiety   ? Arthritis   ? RHEUMATOID OR PSORIATIC ?  ? BCC (basal cell carcinoma of skin) 06/19/2007  ? left lower chest (CX35FU)  ? BCC (basal cell carcinoma of skin) infiltrated 03/11/2018  ? right nose (MOHS)  ? BCC (basal cell carcinoma of skin) ulcerated 07/11/2010  ? left cheek   ? Cancer [facility])   ? Depression   ? Enlarged prostate   ? Hyperlipidemia   ? Numbness   ? Sleep apnea   ? TESTED BACK IN 2017  ? Superficial basal cell carcinoma (BCC) 03/11/2018  ? left shoulder   ? ? ?Surgical History: ?Past Surgical History:  ?Procedure Laterality Date  ? ANTERIOR CERVICAL DECOMP/DISCECTOMY FUSION N/A 09/23/2018  ? Procedure: Cervical Three-Four Cervical Four-Five Cervical Five-Six Anterior cervical decompression/discectomy/fusion;  Surgeon: Erline Levine, MD;  Location: [facility];  Service: Neurosurgery;  Laterality: N/A;  Cervical Three-Four Cervical Four-Five Cervical Five-Six Anterior cervical decompression/discectomy/fusion  ? CARDIAC CATHETERIZATION    ? [place]  ? COLONOSCOPY WITH PROPOFOL N/A 06/23/2020  ? One small polyp at hepatic flexure that was sessile and ablated without specimen for pathologist. Surveillance 10 years if overall health permits.  ? ESOPHAGOGASTRODUODENOSCOPY (EGD) WITH PROPOFOL N/A 08/13/2019  ? prominent Schatzki ring s/p dilation, overlying erosive reflux esophagitis,  small hiatal hernia  ? EYE SURGERY    ? METAL REMOVED FROM LEFT EYE  (IN OFFICE)  ? EYE SURGERY Left 10/2021  ? retinal tear  ? MALONEY DILATION N/A 08/13/2019  ? Procedure: MALONEY DILATION;  Surgeon: Daneil Dolin, MD;  [facility];  Service: Endoscopy;  Laterality: N/A;  ? MOLE REMOVAL    ? POLYPECTOMY  06/23/2020  ? Procedure: POLYPECTOMY;  Surgeon: Daneil Dolin, MD;  [facility];  Service: Endoscopy;;  ? TONSILLECTOMY    ? as a child  ? ? ?Home Medications:  ?Allergies as of 02/05/2022   ?No Known Allergies ?  ? ?  ?Medication List  ?  ? ?  ? Accurate as of February 05, 2022  3:34 PM. If you have any questions, ask your nurse or doctor.  ?  ?  ? ?  ? ?acetaminophen 500 MG tablet ?Commonly known as: TYLENOL ?Take 1,000 mg by mouth as needed for mild pain. ?  ?alfuzosin 10 MG 24 hr tablet ?Commonly known as: UROXATRAL ?Take 1 tablet (10 mg total) by mouth daily with breakfast. ?  ?aspirin EC 81 MG tablet ?Take 81 mg by mouth daily. Swallow whole. ?  ?aspirin 325 MG tablet ?Take 325 mg by mouth daily. ?  ?celecoxib 100 MG capsule ?Commonly known as: CELEBREX ?Take 200 mg by mouth 2 (two) times daily. ?  ?celecoxib 200 MG capsule ?Commonly known as: CELEBREX ?  ?CETIRIZINE HCL PO ?Take 2  tablets by mouth daily. ?  ?CINNAMON PO ?Take 1,200 mg by mouth daily. ?  ?clotrimazole 1 % cream ?Commonly known as: LOTRIMIN ?Apply 1 application topically at bedtime. Feet ?  ?Co-Enzyme Q-10 100 MG Caps ?Take 100 mg by mouth daily. ?  ?diclofenac Sodium 1 % Gel ?Commonly known as: VOLTAREN ?Apply 2 g topically at bedtime. Joint pain ?  ?docusate sodium 100 MG capsule ?Commonly known as: COLACE ?Take 100 mg by mouth daily. ?  ?DULoxetine 60 MG capsule ?Commonly known as: CYMBALTA ?Take 60 mg by mouth daily. ?  ?fexofenadine 180 MG tablet ?Commonly known as: ALLEGRA ?Take 180 mg by mouth daily. ?  ?FIBER PO ?Take by mouth daily. Natural fibers found in fruits and vegetables. ?  ?Flax Seed Oil 1000 MG  Caps ?Take by mouth. ?  ?folic acid 1 MG tablet ?Commonly known as: FOLVITE ?Take 1 mg by mouth daily. ?  ?hydroxyurea 500 MG capsule ?Commonly known as: HYDREA ?Take 500 mg by mouth every other day. May take with food to minimize GI side effects. ?  ?ketoconazole 2 % cream ?Commonly known as: NIZORAL ?Apply 1 application topically daily. ?  ?linaclotide 290 MCG Caps capsule ?Commonly known as: LINZESS ?Take 290 mcg by mouth daily before breakfast. ?  ?Melatonin 10 MG Caps ?Take 10 mg by mouth at bedtime. ?  ?methocarbamol 500 MG tablet ?Commonly known as: ROBAXIN ?Take 500 mg by mouth daily. ?  ?MULTIVITAMIN MEN 50+ PO ?Take by mouth daily. ?  ?OVER THE COUNTER MEDICATION ?Take 1,000 mg by mouth daily. Beet root ?  ?Oxycodone HCl 10 MG Tabs ?Take by mouth. ?  ?oxyCODONE-acetaminophen 10-325 MG tablet ?Commonly known as: PERCOCET ?Take 1 tablet by mouth every 4 (four) hours as needed for pain. ?  ?pantoprazole 40 MG tablet ?Commonly known as: PROTONIX ?Take 1 tablet by mouth daily. ?  ?polyethylene glycol 17 g packet ?Commonly known as: MIRALAX / GLYCOLAX ?Take 17 g by mouth daily as needed. ?  ?pregabalin 100 MG capsule ?Commonly known as: LYRICA ?Take 100 mg by mouth daily. ?  ?pregabalin 50 MG capsule ?Commonly known as: LYRICA ?Take 50-100 mg by mouth at bedtime. ?  ?pregabalin 50 MG capsule ?Commonly known as: LYRICA ?Take by mouth 2 (two) times daily. ?  ?rosuvastatin 5 MG tablet ?Commonly known as: CRESTOR ?Take 5 mg by mouth 2 (two) times a week. ?  ?tadalafil 5 MG tablet ?Commonly known as: CIALIS ?Take 1 tablet (5 mg total) by mouth daily. ?  ?Tart [place] ?Take by mouth. ?  ?traZODone 50 MG tablet ?Commonly known as: DESYREL ?Take 50 mg by mouth at bedtime. ?  ?TURMERIC PO ?Take by mouth at bedtime. ?  ?VITAMIN B-12 PO ?Take by mouth daily. ?  ?Vitamin D3 250 MCG (10000 UT) capsule ?Take 10,000 Units by mouth daily. ?  ? ?  ? ? ?Allergies: No Known Allergies ? ?Family History: ?Family History   ?Problem Relation Age of Onset  ? Heart disease Mother   ? Diabetes Mother   ? Heart attack Mother   ? Heart disease Father   ? Diabetes Father   ? Diabetes Sister   ? Seizures Sister   ? Diabetes Brother   ? Heart attack Brother   ? Sleep apnea Brother   ? Heart attack Brother   ? Sleep apnea Brother   ? Heart attack Brother   ? Diabetes Brother   ? Heart attack Brother   ? Hypothyroidism Son   ?  Hypothyroidism Daughter   ? Colon cancer Neg Hx   ? ? ?Social History:  reports that he quit smoking about 35 years ago. His smoking use included cigarettes. He has a 16.00 pack-year smoking history. He has been exposed to tobacco smoke. He has never used smokeless tobacco. He reports that he does not drink alcohol and does not use drugs. ? ?ROS: ?All other review of systems were reviewed and are negative except what is noted above in HPI ? ?Physical Exam: ?BP (!) 93/57   Pulse 83   ?Constitutional:  Alert and oriented, No acute distress. ?HEENT: [place] AT, moist mucus membranes.  Trachea midline, no masses. ?Cardiovascular: No clubbing, cyanosis, or edema. ?Respiratory: Normal respiratory effort, no increased work of breathing. ?GI: Abdomen is soft, nontender, nondistended, no abdominal masses ?GU: No CVA tenderness.  ?Lymph: No cervical or inguinal lymphadenopathy. ?Skin: No rashes, bruises or suspicious lesions. ?Neurologic: Grossly intact, no focal deficits, moving all 4 extremities. ?Psychiatric: Normal mood and affect. ? ?Laboratory Data: ?Lab Results  ?Component Value Date  ? WBC 5.6 12/08/2020  ? HGB 12.5 (L) 12/08/2020  ? HCT 35.0 (L) 12/08/2020  ? MCV 111.5 (H) 12/08/2020  ? PLT 272 12/08/2020  ? ? ?Lab Results  ?Component Value Date  ? CREATININE 1.13 12/08/2020  ? ? ?Lab Results  ?Component Value Date  ? PSA 1.12 12/02/2015  ? ? ?No results found for: TESTOSTERONE ? ?Lab Results  ?Component Value Date  ? HGBA1C 5.2 04/15/2018  ? ? ?Urinalysis ?   ?Component Value Date/Time  ? APPEARANCEUR Clear 11/09/2021 1328  ?  GLUCOSEU Negative 11/09/2021 1328  ? BILIRUBINUR Negative 11/09/2021 1328  ? PROTEINUR Negative 11/09/2021 1328  ? UROBILINOGEN 1.0 01/15/2020 1121  ? NITRITE Negative 11/09/2021 1328  ? LEUKOCYTESUR Negat

## 2022-02-06 ENCOUNTER — Encounter: Payer: Self-pay | Admitting: Dermatology

## 2022-02-06 ENCOUNTER — Ambulatory Visit (INDEPENDENT_AMBULATORY_CARE_PROVIDER_SITE_OTHER): Payer: Medicare Other | Admitting: Dermatology

## 2022-02-06 DIAGNOSIS — R238 Other skin changes: Secondary | ICD-10-CM

## 2022-02-06 DIAGNOSIS — D692 Other nonthrombocytopenic purpura: Secondary | ICD-10-CM | POA: Diagnosis not present

## 2022-02-06 DIAGNOSIS — D485 Neoplasm of uncertain behavior of skin: Secondary | ICD-10-CM

## 2022-02-06 DIAGNOSIS — D0439 Carcinoma in situ of skin of other parts of face: Secondary | ICD-10-CM

## 2022-02-06 DIAGNOSIS — Z1283 Encounter for screening for malignant neoplasm of skin: Secondary | ICD-10-CM | POA: Diagnosis not present

## 2022-02-06 LAB — URINALYSIS, ROUTINE W REFLEX MICROSCOPIC
Bilirubin, UA: NEGATIVE
Glucose, UA: NEGATIVE
Ketones, UA: NEGATIVE
Leukocytes,UA: NEGATIVE
Nitrite, UA: NEGATIVE
Protein,UA: NEGATIVE
RBC, UA: NEGATIVE
Specific Gravity, UA: 1.01 (ref 1.005–1.030)
Urobilinogen, Ur: 1 mg/dL (ref 0.2–1.0)
pH, UA: 6 (ref 5.0–7.5)

## 2022-02-06 NOTE — Patient Instructions (Addendum)
Over the counter [place] ? ? ?Biopsy, Surgery (Curettage) & Surgery (Excision) Aftercare Instructions ? ?1. Okay to remove bandage in 24 hours ? ?2. Wash area with soap and water ? ?3. Apply Vaseline to area twice daily until healed (Not Neosporin) ? ?4. Okay to cover with a Band-Aid to decrease the chance of infection or prevent irritation from clothing; also it's okay to uncover lesion at home. ? ?5. Suture instructions: return to our office in 7-10 or 10-14 days for a nurse visit for suture removal. Variable healing with sutures, if pain or itching occurs call our office. It's okay to shower or bathe 24 hours after sutures are given. ? ?6. The following risks may occur after a biopsy, curettage or excision: bleeding, scarring, discoloration, recurrence, infection (redness, yellow drainage, pain or swelling). ? ?7. For questions, concerns and results call our office at [facility] before 4pm & Friday before 3pm. Biopsy results will be available in 1 week. ? ?

## 2022-02-12 ENCOUNTER — Telehealth: Payer: Self-pay | Admitting: *Deleted

## 2022-02-12 NOTE — Telephone Encounter (Signed)
-----   Message from Lavonna Monarch, MD sent at 02/09/2022  6:11 PM EDT ----- ?Schedule surgery with Dr. Darene Lamer ?

## 2022-02-12 NOTE — Telephone Encounter (Signed)
Left patient a message to call back for results.  ?

## 2022-02-13 ENCOUNTER — Telehealth: Payer: Self-pay | Admitting: Dermatology

## 2022-02-13 NOTE — Telephone Encounter (Signed)
Results

## 2022-02-14 NOTE — Telephone Encounter (Signed)
Pathology to patient wife Alfred Jones)- surgery appointment scheduled.  ?

## 2022-02-15 ENCOUNTER — Ambulatory Visit (INDEPENDENT_AMBULATORY_CARE_PROVIDER_SITE_OTHER): Payer: Medicare Other | Admitting: Physician Assistant

## 2022-02-15 VITALS — BP 166/93 | HR 71 | Ht 71.0 in | Wt 220.0 lb

## 2022-02-15 DIAGNOSIS — N390 Urinary tract infection, site not specified: Secondary | ICD-10-CM | POA: Diagnosis not present

## 2022-02-15 DIAGNOSIS — N138 Other obstructive and reflux uropathy: Secondary | ICD-10-CM | POA: Diagnosis not present

## 2022-02-15 DIAGNOSIS — R1030 Lower abdominal pain, unspecified: Secondary | ICD-10-CM

## 2022-02-15 DIAGNOSIS — N401 Enlarged prostate with lower urinary tract symptoms: Secondary | ICD-10-CM | POA: Diagnosis not present

## 2022-02-15 LAB — BLADDER SCAN AMB NON-IMAGING: Scan Result: 59

## 2022-02-15 NOTE — Progress Notes (Signed)
? ?Assessment: ?1. Benign prostatic hyperplasia with urinary obstruction ?- Urinalysis, Routine w reflex microscopic ?- BLADDER SCAN AMB NON-IMAGING ? ?2. Lower abdominal pain ?  ? ?Plan: ?Pt reassured that UA indicates no evidence of UTI and sxs are likely from his GI upset. Continue Uroxatrol and FU for pre-op eval with Dr. Alyson Ingles before Urolift.  ? ?Chief Complaint: ?No chief complaint on file. ? ? ?HPI: ?Alfred Jones is a 71 y.o. male who presents for evaluation of lower abdominal pain since last night.  ?Pt had diarrhea this morning and c/o some lower abdominal discomfort. Home test indicated possible UTI. No dysuria, burning, gross hematuria. Pt denies fever, chills. Pt scheduled for Urolift next month. ? ?UA clear today ? ? ?Allergies: ?No Known Allergies ? ?PMH: ?Past Medical History:  ?Diagnosis Date  ? Acid reflux   ? Anxiety   ? Arthritis   ? RHEUMATOID OR PSORIATIC ?  ? BCC (basal cell carcinoma of skin) 06/19/2007  ? left lower chest (CX35FU)  ? BCC (basal cell carcinoma of skin) infiltrated 03/11/2018  ? right nose (MOHS)  ? BCC (basal cell carcinoma of skin) ulcerated 07/11/2010  ? left cheek   ? Cancer [facility])   ? Depression   ? Enlarged prostate   ? Hyperlipidemia   ? Numbness   ? Sleep apnea   ? TESTED BACK IN 2017  ? Superficial basal cell carcinoma (BCC) 03/11/2018  ? left shoulder   ? ? ?PSH: ?Past Surgical History:  ?Procedure Laterality Date  ? ANTERIOR CERVICAL DECOMP/DISCECTOMY FUSION N/A 09/23/2018  ? Procedure: Cervical Three-Four Cervical Four-Five Cervical Five-Six Anterior cervical decompression/discectomy/fusion;  Surgeon: Erline Levine, MD;  Location: [place];  Service: Neurosurgery;  Laterality: N/A;  Cervical Three-Four Cervical Four-Five Cervical Five-Six Anterior cervical decompression/discectomy/fusion  ? CARDIAC CATHETERIZATION    ? [place]  ? COLONOSCOPY WITH PROPOFOL N/A 06/23/2020  ? One small polyp at hepatic flexure that was sessile and ablated without  specimen for pathologist. Surveillance 10 years if overall health permits.  ? ESOPHAGOGASTRODUODENOSCOPY (EGD) WITH PROPOFOL N/A 08/13/2019  ? prominent Schatzki ring s/p dilation, overlying erosive reflux esophagitis, small hiatal hernia  ? EYE SURGERY    ? METAL REMOVED FROM LEFT EYE  (IN OFFICE)  ? EYE SURGERY Left 10/2021  ? retinal tear  ? MALONEY DILATION N/A 08/13/2019  ? Procedure: MALONEY DILATION;  Surgeon: Daneil Dolin, MD;  [facility];  Service: Endoscopy;  Laterality: N/A;  ? MOLE REMOVAL    ? POLYPECTOMY  06/23/2020  ? Procedure: POLYPECTOMY;  Surgeon: Daneil Dolin, MD;  [facility];  Service: Endoscopy;;  ? TONSILLECTOMY    ? as a child  ? ? ?SH: ?Social History  ? ?Tobacco Use  ? Smoking status: Former  ?  Packs/day: 1.00  ?  Years: 16.00  ?  Pack years: 16.00  ?  Types: Cigarettes  ?  Quit date: 10/22/1986  ?  Years since quitting: 35.3  ?  Passive exposure: Current  ? Smokeless tobacco: Never  ?Vaping Use  ? Vaping Use: Never used  ?Substance Use Topics  ? Alcohol use: No  ? Drug use: No  ? ? ?ROS: ?Constitutional:  Negative for fever, chills, weight loss ?CV: Negative for chest pain ?Respiratory:  Negative for shortness of breath, wheezing, sleep apnea, frequent cough ?GI:  Negative for nausea, vomiting, bloody stool, GERD ? ?PE: ?BP (!) 166/93   Pulse 71   Ht '5\' 11"'$  (  1.803 m)   Wt 220 lb (99.8 kg)   BMI 30.68 kg/m?  ?GENERAL APPEARANCE:  Well appearing, well developed, well nourished, NAD ?HEENT:  Atraumatic, normocephalic ?NECK:  Supple. Trachea midline ?ABDOMEN:  Soft, non-tender, no masses. No CVAT ?EXTREMITIES:  Moves all extremities well, without clubbing, cyanosis, or edema ? ? ?Results: ?Laboratory Data: ?Lab Results  ?Component Value Date  ? WBC 5.6 12/08/2020  ? HGB 12.5 (L) 12/08/2020  ? HCT 35.0 (L) 12/08/2020  ? MCV 111.5 (H) 12/08/2020  ? PLT 272 12/08/2020  ? ? ?Lab Results  ?Component Value Date  ? CREATININE 1.13 12/08/2020  ? ? ?Lab Results   ?Component Value Date  ? PSA 1.12 12/02/2015  ? ? ?Lab Results  ?Component Value Date  ? HGBA1C 5.2 04/15/2018  ? ? ?Urinalysis ?   ?Component Value Date/Time  ? APPEARANCEUR Clear 02/05/2022 1542  ? GLUCOSEU Negative 02/05/2022 1542  ? BILIRUBINUR Negative 02/05/2022 1542  ? PROTEINUR Negative 02/05/2022 1542  ? UROBILINOGEN 1.0 01/15/2020 1121  ? NITRITE Negative 02/05/2022 1542  ? LEUKOCYTESUR Negative 02/05/2022 1542  ? ? ?Lab Results  ?Component Value Date  ? LABMICR Comment 02/05/2022  ? [place] None seen 07/29/2020  ? LABEPIT None seen 07/29/2020  ? MUCUS Present 07/29/2020  ? BACTERIA None seen 07/29/2020  ? ? ?Pertinent Imaging: ?Results for orders placed during the hospital encounter of 07/11/20 ? ?DG Abd 1 View ? ?Narrative ?CLINICAL DATA:  Constipation ? ?EXAM: ?ABDOMEN - 1 VIEW ? ?COMPARISON:  X-ray abdomen 04/18/2018, CT abdomen pelvis 06/03/2012 ? ?FINDINGS: ?The bowel gas pattern is normal. Stool within the ascending and ?proximal transverse colon. No radio-opaque calculi or other ?significant radiographic abnormality are seen. Visualized osseous ?structures are grossly unremarkable. ? ?IMPRESSION: ?Nonobstructive bowel gas pattern with stool within the ascending ?colon proximal transverse colon. ? ? ?Electronically Signed ?By: Iven Finn M.D. ?On: 07/11/2020 23:52 ? ?No results found for this or any previous visit. ? ?No results found for this or any previous visit. ? ?No results found for this or any previous visit. ? ?No results found for this or any previous visit. ? ?No results found for this or any previous visit. ? ?No results found for this or any previous visit. ? ?No results found for this or any previous visit. ? ?No results found for this or any previous visit (from the past 24 hour(s)).  ?

## 2022-02-15 NOTE — Progress Notes (Signed)
post void residual =59mL 

## 2022-02-16 LAB — URINALYSIS, ROUTINE W REFLEX MICROSCOPIC
Bilirubin, UA: NEGATIVE
Glucose, UA: NEGATIVE
Ketones, UA: NEGATIVE
Leukocytes,UA: NEGATIVE
Nitrite, UA: NEGATIVE
Protein,UA: NEGATIVE
RBC, UA: NEGATIVE
Specific Gravity, UA: 1.02 (ref 1.005–1.030)
Urobilinogen, Ur: 1 mg/dL (ref 0.2–1.0)
pH, UA: 7 (ref 5.0–7.5)

## 2022-02-24 ENCOUNTER — Encounter: Payer: Self-pay | Admitting: Dermatology

## 2022-02-24 NOTE — Progress Notes (Signed)
? ?  Follow-Up Visit ?  ?Subjective  ?Alfred Jones is a 71 y.o. male who presents for the following: Annual Exam (Left temple bleeds, the tip of the nose swells & left upper arm is a new lesion ). ? ?Annual skin examination, several spots of concern. ?Location:  ?Duration:  ?Quality:  ?Associated Signs/Symptoms: ?Modifying Factors:  ?Severity:  ?Timing: ?Context:  ? ?Objective  ?Well appearing patient in no apparent distress; mood and affect are within normal limits. ?Waist up skin examination, no atypical pigmented lesions (all checked with dermoscopy), 1 possible new nonmelanoma skin cancer left temple will be biopsied ? ?Left Temple ?8 mm waxy flesh-colored papule, rule out superficial carcinoma ? ? ? ? ?Mid Lower Vermilion Lip ?Soft 4 mm partially compressible dermal papule, typical dermoscopy ? ?Left Arm ?Arms bruise with minimal trauma, no other abnormal bleeding. ? ? ? ?All skin waist up examined. ? ? ?Assessment & Plan  ? ? ?Neoplasm of uncertain behavior of skin ?Left Temple ? ?Skin / nail biopsy ?Type of biopsy: tangential   ?Informed consent: discussed and consent obtained   ?Timeout: patient name, date of birth, surgical site, and procedure verified   ?Anesthesia: the lesion was anesthetized in a standard fashion   ?Anesthetic:  1% lidocaine w/ epinephrine 1-100,000 local infiltration ?Instrument used: flexible razor blade   ?Hemostasis achieved with: aluminum chloride and electrodesiccation   ?Outcome: patient tolerated procedure well   ?Post-procedure details: wound care instructions given   ? ?Specimen 1 - Surgical pathology ?Differential Diagnosis: bcc scc ? ?Check Margins: No ? ?[place]ous [place] ?Mid Lower Vermilion Lip ? ?No intervention indicated ? ?Screening exam for skin cancer ? ?Annual skin examination ? ?Senile purpura ([place]) ?Left Arm ? ?Dermend over the counter  ? ? ? ? ? ?I, Lavonna Monarch, MD, have reviewed all documentation for this visit.  The documentation on 02/24/22 for the exam, diagnosis,  procedures, and orders are all accurate and complete. ?

## 2022-03-21 ENCOUNTER — Encounter: Payer: Self-pay | Admitting: Internal Medicine

## 2022-03-23 DIAGNOSIS — M5416 Radiculopathy, lumbar region: Secondary | ICD-10-CM | POA: Diagnosis not present

## 2022-03-29 ENCOUNTER — Ambulatory Visit (INDEPENDENT_AMBULATORY_CARE_PROVIDER_SITE_OTHER): Payer: Medicare Other | Admitting: Dermatology

## 2022-03-29 DIAGNOSIS — C44329 Squamous cell carcinoma of skin of other parts of face: Secondary | ICD-10-CM | POA: Diagnosis not present

## 2022-03-29 DIAGNOSIS — C44320 Squamous cell carcinoma of skin of unspecified parts of face: Secondary | ICD-10-CM

## 2022-03-29 NOTE — Patient Instructions (Signed)

## 2022-04-04 ENCOUNTER — Ambulatory Visit (INDEPENDENT_AMBULATORY_CARE_PROVIDER_SITE_OTHER): Payer: Medicare Other | Admitting: Internal Medicine

## 2022-04-04 ENCOUNTER — Encounter: Payer: Self-pay | Admitting: Internal Medicine

## 2022-04-04 VITALS — BP 135/70 | HR 79 | Temp 97.7°F | Ht 70.0 in | Wt 223.0 lb

## 2022-04-04 DIAGNOSIS — K219 Gastro-esophageal reflux disease without esophagitis: Secondary | ICD-10-CM | POA: Diagnosis not present

## 2022-04-04 NOTE — Progress Notes (Signed)
Primary Care Physician:  Curlene Labrum, MD Primary Gastroenterologist:  Dr. Gala Romney  Pre-Procedure History & Physical: HPI:  Alfred Jones is a 71 y.o. male here for follow-up of GERD.  GERD well-controlled on Protonix twice daily.  Requires twice daily dosing.  Some intermittent vague esophageal dysphagia from time to time but nothing like prior to having his Schatzki's ring stretched a few years ago.  Chronic constipation effectively managed with the Linzess 290 daily.  Wife currently being treated for H. pylori.  He wants to be checked.  Past Medical History:  Diagnosis Date   Acid reflux    Anxiety    Arthritis    RHEUMATOID OR PSORIATIC ?   BCC (basal cell carcinoma of skin) 06/19/2007   left lower chest (CX35FU)   BCC (basal cell carcinoma of skin) infiltrated 03/11/2018   right nose (MOHS)   BCC (basal cell carcinoma of skin) ulcerated 07/11/2010   left cheek    Cancer ([place])    Depression    Enlarged prostate    Hyperlipidemia    Numbness    Sleep apnea    TESTED BACK IN 2017   Superficial basal cell carcinoma (BCC) 03/11/2018   left shoulder     Past Surgical History:  Procedure Laterality Date   ANTERIOR CERVICAL DECOMP/DISCECTOMY FUSION N/A 09/23/2018   Procedure: Cervical Three-Four Cervical Four-Five Cervical Five-Six Anterior cervical decompression/discectomy/fusion;  Surgeon: Erline Levine, MD;  Location: [place];  Service: Neurosurgery;  Laterality: N/A;  Cervical Three-Four Cervical Four-Five Cervical Five-Six Anterior cervical decompression/discectomy/fusion   CARDIAC CATHETERIZATION     20 YRS AGO AT [facility]   COLONOSCOPY WITH PROPOFOL N/A 06/23/2020   One small polyp at hepatic flexure that was sessile and ablated without specimen for pathologist. Surveillance 10 years if overall health permits.   ESOPHAGOGASTRODUODENOSCOPY (EGD) WITH PROPOFOL N/A 08/13/2019   prominent Schatzki ring s/p dilation, overlying erosive reflux esophagitis, small hiatal  hernia   EYE SURGERY     METAL REMOVED FROM LEFT EYE  (IN OFFICE)   EYE SURGERY Left 10/2021   retinal tear   MALONEY DILATION N/A 08/13/2019   Procedure: MALONEY DILATION;  Surgeon: Daneil Dolin, MD;  [facility];  Service: Endoscopy;  Laterality: N/A;   MOLE REMOVAL     POLYPECTOMY  06/23/2020   Procedure: POLYPECTOMY;  Surgeon: Daneil Dolin, MD;  [facility];  Service: Endoscopy;;   TONSILLECTOMY     as a child    Prior to Admission medications   Medication Sig Start Date End Date Taking? Authorizing Provider  acetaminophen (TYLENOL) 500 MG tablet Take 1,000 mg by mouth 2 (two) times daily.   Yes [provider]  alfuzosin (UROXATRAL) 10 MG 24 hr tablet Take 1 tablet (10 mg total) by mouth daily with breakfast. 07/27/21  Yes Irine Seal, MD  aspirin 325 MG tablet Take 325 mg by mouth daily.   Yes [provider]  celecoxib (CELEBREX) 200 MG capsule Take 200 mg by mouth 2 (two) times daily. 07/21/21  Yes [provider]  cetirizine (ZYRTEC) 10 MG tablet Take 2 tablets by mouth daily.   Yes [provider]  Cholecalciferol (VITAMIN D3) 250 MCG (10000 UT) capsule Take 10,000 Units by mouth daily.   Yes [provider]  CINNAMON PO Take 1,200 mg by mouth daily.   Yes [provider]  clotrimazole (LOTRIMIN) 1 % cream Apply 1 application topically at bedtime. Feet   Yes [provider]  Co-Enzyme Q-10 100 MG CAPS Take 100 mg by mouth daily.   Yes [provider]  Cyanocobalamin (VITAMIN B-12 PO) Take by mouth daily.   Yes [provider]  diclofenac Sodium (VOLTAREN) 1 % GEL Apply 2 g topically at bedtime. Joint pain   Yes [provider]  docusate sodium (COLACE) 100 MG capsule Take 200 mg by mouth 2 (two) times daily.   Yes [provider]  DULoxetine (CYMBALTA) 60 MG capsule Take 60 mg by mouth daily.   Yes [provider]  fexofenadine (ALLEGRA) 180 MG  tablet Take 180 mg by mouth daily.   Yes [provider]  FIBER PO Take by mouth daily. Natural fibers found in fruits and vegetables.   Yes [provider]  Flaxseed, Linseed, (FLAX SEED OIL) 1000 MG CAPS Take by mouth.   Yes [provider]  folic acid (FOLVITE) 1 MG tablet Take 1 mg by mouth daily.   Yes [provider]  hydroxyurea (HYDREA) 500 MG capsule Take 500 mg by mouth every other day. 1000 mg on Tues, Thurs, Sat, Sun and 500 mg on Mon, Wed, Fri   Yes [provider]  ketoconazole (NIZORAL) 2 % cream Apply 1 application. topically daily.   Yes [provider]  ketorolac (ACULAR) 0.5 % ophthalmic solution Place 1 drop into the right eye 4 (four) times daily. 03/01/22  Yes [provider]  linaclotide (LINZESS) 290 MCG CAPS capsule Take 290 mcg by mouth daily before breakfast.   Yes [provider]  Melatonin 10 MG CAPS Take 10 mg by mouth at bedtime.    Yes [provider]  methocarbamol (ROBAXIN) 500 MG tablet Take 500 mg by mouth daily.   Yes [provider]  moxifloxacin (VIGAMOX) 0.5 % ophthalmic solution Place 1 drop into the right eye 4 (four) times daily. 03/01/22  Yes [provider]  Multiple Vitamins-Minerals (MULTIVITAMIN MEN 50+ PO) Take by mouth daily.   Yes [provider]  OVER THE COUNTER MEDICATION Take 1,000 mg by mouth daily. Beet root   Yes [provider]  Oxycodone HCl 10 MG TABS Take by mouth. 07/25/21  Yes [provider]  pantoprazole (PROTONIX) 40 MG tablet Take 1 tablet by mouth 2 (two) times daily. 01/11/21  Yes [provider]  polyethylene glycol (MIRALAX / GLYCOLAX) 17 g packet Take 17 g by mouth daily as needed.   Yes [provider]  prednisoLONE acetate (PRED FORTE) 1 % ophthalmic suspension Place 1 drop into the right eye 4 (four) times daily. 03/01/22  Yes [provider]  pregabalin (LYRICA) 50 MG capsule  Take by mouth 2 (two) times daily. 01/24/22  Yes [provider]  rosuvastatin (CRESTOR) 5 MG tablet Take 5 mg by mouth 2 (two) times a week.   Yes [provider]  tadalafil (CIALIS) 5 MG tablet Take 1 tablet (5 mg total) by mouth daily. 07/27/21  Yes Irine Seal, MD  Tart Cherry 1200 MG CAPS Take by mouth.   Yes [provider]  traZODone (DESYREL) 50 MG tablet Take 50 mg by mouth at bedtime.   Yes [provider]  TURMERIC PO Take by mouth at bedtime.   Yes [provider]    Allergies as of 04/04/2022   (No Known Allergies)    Family History  Problem Relation Age of Onset   Heart disease Mother    Diabetes Mother    Heart attack Mother  Heart disease Father    Diabetes Father    Diabetes Sister    Seizures Sister    Diabetes Brother    Heart attack Brother    Sleep apnea Brother    Heart attack Brother    Sleep apnea Brother    Heart attack Brother    Diabetes Brother    Heart attack Brother    Hypothyroidism Son    Hypothyroidism Daughter    Colon cancer Neg Hx     Social History   Socioeconomic History   Marital status: Married    Spouse name: Not on file   Number of children: 2   Years of education: 12   Highest education level: Not on file  Occupational History   Occupation: Dealer  Tobacco Use   Smoking status: Former    Packs/day: 1.00    Years: 16.00    Total pack years: 16.00    Types: Cigarettes    Quit date: 10/22/1986    Years since quitting: 35.4    Passive exposure: Current   Smokeless tobacco: Never  Vaping Use   Vaping Use: Never used  Substance and Sexual Activity   Alcohol use: No   Drug use: No   Sexual activity: Yes  Other Topics Concern   Not on file  Social History Narrative   Lives at home with his wife and son.   Right-handed.   2-4 cups caffeine per day.   Social Determinants of Health   Financial Resource Strain: Not on file  Food Insecurity: Not on file  Transportation  Needs: Not on file  Physical Activity: Not on file  Stress: Not on file  Social Connections: Not on file  Intimate Partner Violence: Not on file    Review of Systems: See HPI, otherwise negative ROS  Physical Exam: BP 135/70 (BP Location: Right Arm, Patient Position: Sitting, Cuff Size: Normal)   Pulse 79   Temp 97.7 F (36.5 C) (Temporal)   Ht '5\' 10"'$  (1.778 m)   Wt 223 lb (101.2 kg)   SpO2 96%   BMI 32.00 kg/m  General:   Alert,  Well-developed, well-nourished, pleasant and cooperative in NAD Neck:  Supple; no masses or thyromegaly. No significant cervical adenopathy. Lungs:  Clear throughout to auscultation.   No wheezes, crackles, or rhonchi. No acute distress. Heart:  Regular rate and rhythm; no murmurs, clicks, rubs,  or gallops. Abdomen: Non-distended, normal bowel sounds.  Soft and nontender without appreciable mass or hepatosplenomegaly.  Pulses:  Normal pulses noted. Extremities:  Without clubbing or edema.  Impression/Plan: 71 year old gentleman with GERD well-controlled on twice daily PPI therapy.  Vague intermittent dysphagia.  Patient does not feel he needs to have anything done at this time.  Known Schatzki's ring dilated previously.  No alarm features otherwise. Chronic constipation managed with Linzess daily Family history of H. pylori in his spouse.  Patient desires to be checked.   Recommendations:  Continue taking Protonix 40 mg twice daily (best taken before breakfast and supper)  Plan for repeat colonoscopy for screening in 8 years.  If your swallowing difficulties worsen, please let us know and we will set up for repeat endoscopy and dilation  For H. pylori testing, stop your Protonix entirely 2 weeks before 7/17.  Then you could submit a stool sample for testing  Office visit here in 1 year.   Notice: This dictation was prepared with Dragon dictation along with smaller phrase technology. Any transcriptional errors that result from this process are  unintentional and may not be corrected upon review.

## 2022-04-04 NOTE — Patient Instructions (Addendum)
It was good to see you again today!  Continue taking Protonix 40 mg twice daily (best taken before breakfast and supper)  Plan for repeat colonoscopy for screening in 8 years.  If your swallowing difficulties worsen, please let us know and we will set up for repeat endoscopy and dilation  For H. pylori testing, stop your Protonix entirely 2 weeks before 7/17.  Then you could submit a stool sample for testing  Office visit here in 1 year.

## 2022-04-16 ENCOUNTER — Encounter: Payer: Self-pay | Admitting: Dermatology

## 2022-04-25 DIAGNOSIS — D473 Essential (hemorrhagic) thrombocythemia: Secondary | ICD-10-CM | POA: Diagnosis not present

## 2022-04-25 DIAGNOSIS — Z09 Encounter for follow-up examination after completed treatment for conditions other than malignant neoplasm: Secondary | ICD-10-CM | POA: Diagnosis not present

## 2022-04-25 DIAGNOSIS — I7381 Erythromelalgia: Secondary | ICD-10-CM | POA: Diagnosis not present

## 2022-04-25 DIAGNOSIS — I10 Essential (primary) hypertension: Secondary | ICD-10-CM | POA: Diagnosis not present

## 2022-04-27 DIAGNOSIS — H2511 Age-related nuclear cataract, right eye: Secondary | ICD-10-CM | POA: Diagnosis not present

## 2022-05-02 DIAGNOSIS — Z6831 Body mass index (BMI) 31.0-31.9, adult: Secondary | ICD-10-CM | POA: Diagnosis not present

## 2022-05-02 DIAGNOSIS — D473 Essential (hemorrhagic) thrombocythemia: Secondary | ICD-10-CM | POA: Diagnosis not present

## 2022-05-03 ENCOUNTER — Telehealth: Payer: Self-pay | Admitting: Internal Medicine

## 2022-05-03 ENCOUNTER — Other Ambulatory Visit: Payer: Self-pay

## 2022-05-03 DIAGNOSIS — K219 Gastro-esophageal reflux disease without esophagitis: Secondary | ICD-10-CM

## 2022-05-03 NOTE — Telephone Encounter (Signed)
Spoke with wife and informed her of where to pick stool kits for her and her husband up at.

## 2022-05-03 NOTE — Telephone Encounter (Signed)
Pt's wife, Benjamine Mola, has questions for the nurse about a fecal test.  Please call [phone]

## 2022-05-10 DIAGNOSIS — K219 Gastro-esophageal reflux disease without esophagitis: Secondary | ICD-10-CM | POA: Diagnosis not present

## 2022-05-12 LAB — H. PYLORI ANTIGEN, STOOL: H pylori Ag, Stl: NEGATIVE

## 2022-05-25 DIAGNOSIS — E039 Hypothyroidism, unspecified: Secondary | ICD-10-CM | POA: Diagnosis not present

## 2022-05-25 DIAGNOSIS — E7849 Other hyperlipidemia: Secondary | ICD-10-CM | POA: Diagnosis not present

## 2022-05-25 DIAGNOSIS — K219 Gastro-esophageal reflux disease without esophagitis: Secondary | ICD-10-CM | POA: Diagnosis not present

## 2022-05-30 DIAGNOSIS — Z6832 Body mass index (BMI) 32.0-32.9, adult: Secondary | ICD-10-CM | POA: Diagnosis not present

## 2022-05-30 DIAGNOSIS — G4733 Obstructive sleep apnea (adult) (pediatric): Secondary | ICD-10-CM | POA: Diagnosis not present

## 2022-05-30 DIAGNOSIS — D473 Essential (hemorrhagic) thrombocythemia: Secondary | ICD-10-CM | POA: Diagnosis not present

## 2022-05-30 DIAGNOSIS — E039 Hypothyroidism, unspecified: Secondary | ICD-10-CM | POA: Diagnosis not present

## 2022-05-30 DIAGNOSIS — F5104 Psychophysiologic insomnia: Secondary | ICD-10-CM | POA: Diagnosis not present

## 2022-05-30 DIAGNOSIS — G629 Polyneuropathy, unspecified: Secondary | ICD-10-CM | POA: Diagnosis not present

## 2022-05-30 DIAGNOSIS — M4712 Other spondylosis with myelopathy, cervical region: Secondary | ICD-10-CM | POA: Diagnosis not present

## 2022-05-30 DIAGNOSIS — M2550 Pain in unspecified joint: Secondary | ICD-10-CM | POA: Diagnosis not present

## 2022-05-30 DIAGNOSIS — Z0001 Encounter for general adult medical examination with abnormal findings: Secondary | ICD-10-CM | POA: Diagnosis not present

## 2022-05-30 DIAGNOSIS — E7849 Other hyperlipidemia: Secondary | ICD-10-CM | POA: Diagnosis not present

## 2022-05-30 DIAGNOSIS — L4050 Arthropathic psoriasis, unspecified: Secondary | ICD-10-CM | POA: Diagnosis not present

## 2022-06-26 DIAGNOSIS — M5416 Radiculopathy, lumbar region: Secondary | ICD-10-CM | POA: Diagnosis not present
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
## 2022-06-29 DIAGNOSIS — R030 Elevated blood-pressure reading, without diagnosis of hypertension: Secondary | ICD-10-CM | POA: Diagnosis not present

## 2022-06-29 DIAGNOSIS — Z6832 Body mass index (BMI) 32.0-32.9, adult: Secondary | ICD-10-CM | POA: Diagnosis not present

## 2022-06-29 DIAGNOSIS — R509 Fever, unspecified: Secondary | ICD-10-CM | POA: Diagnosis not present

## 2022-06-29 DIAGNOSIS — F5104 Psychophysiologic insomnia: Secondary | ICD-10-CM | POA: Diagnosis not present

## 2022-06-29 DIAGNOSIS — L4050 Arthropathic psoriasis, unspecified: Secondary | ICD-10-CM | POA: Diagnosis not present

## 2022-06-29 DIAGNOSIS — M2550 Pain in unspecified joint: Secondary | ICD-10-CM | POA: Diagnosis not present

## 2022-07-10 ENCOUNTER — Encounter: Payer: Self-pay | Admitting: Rheumatology

## 2022-07-10 ENCOUNTER — Ambulatory Visit: Payer: Medicare Other | Attending: Rheumatology | Admitting: Rheumatology

## 2022-07-10 VITALS — BP 121/70 | HR 69 | Resp 17 | Ht 71.0 in | Wt 223.4 lb

## 2022-07-10 DIAGNOSIS — M170 Bilateral primary osteoarthritis of knee: Secondary | ICD-10-CM

## 2022-07-10 DIAGNOSIS — M51369 Other intervertebral disc degeneration, lumbar region without mention of lumbar back pain or lower extremity pain: Secondary | ICD-10-CM

## 2022-07-10 DIAGNOSIS — D0439 Carcinoma in situ of skin of other parts of face: Secondary | ICD-10-CM | POA: Diagnosis not present

## 2022-07-10 DIAGNOSIS — Z840 Family history of diseases of the skin and subcutaneous tissue: Secondary | ICD-10-CM

## 2022-07-10 DIAGNOSIS — I7381 Erythromelalgia: Secondary | ICD-10-CM | POA: Diagnosis not present

## 2022-07-10 DIAGNOSIS — M19041 Primary osteoarthritis, right hand: Secondary | ICD-10-CM

## 2022-07-10 DIAGNOSIS — M5136 Other intervertebral disc degeneration, lumbar region: Secondary | ICD-10-CM | POA: Insufficient documentation

## 2022-07-10 DIAGNOSIS — M79642 Pain in left hand: Secondary | ICD-10-CM | POA: Insufficient documentation

## 2022-07-10 DIAGNOSIS — G8929 Other chronic pain: Secondary | ICD-10-CM

## 2022-07-10 DIAGNOSIS — D473 Essential (hemorrhagic) thrombocythemia: Secondary | ICD-10-CM | POA: Diagnosis not present

## 2022-07-10 DIAGNOSIS — M19071 Primary osteoarthritis, right ankle and foot: Secondary | ICD-10-CM | POA: Diagnosis not present

## 2022-07-10 DIAGNOSIS — M19042 Primary osteoarthritis, left hand: Secondary | ICD-10-CM | POA: Diagnosis not present

## 2022-07-10 DIAGNOSIS — M79641 Pain in right hand: Secondary | ICD-10-CM

## 2022-07-10 DIAGNOSIS — G589 Mononeuropathy, unspecified: Secondary | ICD-10-CM

## 2022-07-10 DIAGNOSIS — R21 Rash and other nonspecific skin eruption: Secondary | ICD-10-CM

## 2022-07-10 DIAGNOSIS — M5030 Other cervical disc degeneration, unspecified cervical region: Secondary | ICD-10-CM | POA: Diagnosis not present

## 2022-07-10 DIAGNOSIS — Z8639 Personal history of other endocrine, nutritional and metabolic disease: Secondary | ICD-10-CM

## 2022-07-10 DIAGNOSIS — Z8719 Personal history of other diseases of the digestive system: Secondary | ICD-10-CM

## 2022-07-10 DIAGNOSIS — G629 Polyneuropathy, unspecified: Secondary | ICD-10-CM | POA: Diagnosis not present

## 2022-07-10 DIAGNOSIS — E559 Vitamin D deficiency, unspecified: Secondary | ICD-10-CM | POA: Diagnosis not present

## 2022-07-10 DIAGNOSIS — M25562 Pain in left knee: Secondary | ICD-10-CM | POA: Diagnosis not present

## 2022-07-10 DIAGNOSIS — M19072 Primary osteoarthritis, left ankle and foot: Secondary | ICD-10-CM | POA: Insufficient documentation

## 2022-07-26 DIAGNOSIS — I7381 Erythromelalgia: Secondary | ICD-10-CM | POA: Diagnosis not present

## 2022-07-26 DIAGNOSIS — D473 Essential (hemorrhagic) thrombocythemia: Secondary | ICD-10-CM | POA: Diagnosis not present

## 2022-07-26 DIAGNOSIS — Z09 Encounter for follow-up examination after completed treatment for conditions other than malignant neoplasm: Secondary | ICD-10-CM | POA: Diagnosis not present

## 2022-08-02 DIAGNOSIS — D473 Essential (hemorrhagic) thrombocythemia: Secondary | ICD-10-CM | POA: Diagnosis not present

## 2022-08-02 DIAGNOSIS — Z6831 Body mass index (BMI) 31.0-31.9, adult: Secondary | ICD-10-CM | POA: Diagnosis not present

## 2022-08-29 DIAGNOSIS — E782 Mixed hyperlipidemia: Secondary | ICD-10-CM | POA: Diagnosis not present

## 2022-08-29 DIAGNOSIS — D529 Folate deficiency anemia, unspecified: Secondary | ICD-10-CM | POA: Diagnosis not present

## 2022-08-29 DIAGNOSIS — R7989 Other specified abnormal findings of blood chemistry: Secondary | ICD-10-CM | POA: Diagnosis not present

## 2022-08-29 DIAGNOSIS — E039 Hypothyroidism, unspecified: Secondary | ICD-10-CM | POA: Diagnosis not present

## 2022-08-29 DIAGNOSIS — R600 Localized edema: Secondary | ICD-10-CM | POA: Diagnosis not present

## 2022-08-29 DIAGNOSIS — E7849 Other hyperlipidemia: Secondary | ICD-10-CM | POA: Diagnosis not present

## 2022-08-29 DIAGNOSIS — R5383 Other fatigue: Secondary | ICD-10-CM | POA: Diagnosis not present

## 2022-08-29 DIAGNOSIS — D519 Vitamin B12 deficiency anemia, unspecified: Secondary | ICD-10-CM | POA: Diagnosis not present

## 2022-08-29 DIAGNOSIS — E559 Vitamin D deficiency, unspecified: Secondary | ICD-10-CM | POA: Diagnosis not present

## 2022-09-04 ENCOUNTER — Emergency Department (HOSPITAL_COMMUNITY)
Admission: EM | Admit: 2022-09-04 | Discharge: 2022-09-05 | Disposition: A | Discharge status: Home / Self Care | Payer: Medicare Other | Attending: Emergency Medicine | Admitting: Emergency Medicine

## 2022-09-04 ENCOUNTER — Emergency Department (HOSPITAL_COMMUNITY): Payer: Medicare Other

## 2022-09-04 ENCOUNTER — Other Ambulatory Visit: Payer: Self-pay

## 2022-09-04 ENCOUNTER — Encounter (HOSPITAL_COMMUNITY): Payer: Self-pay | Admitting: Emergency Medicine

## 2022-09-04 DIAGNOSIS — K449 Diaphragmatic hernia without obstruction or gangrene: Secondary | ICD-10-CM | POA: Diagnosis not present

## 2022-09-04 DIAGNOSIS — G629 Polyneuropathy, unspecified: Secondary | ICD-10-CM | POA: Diagnosis not present

## 2022-09-04 DIAGNOSIS — R0789 Other chest pain: Secondary | ICD-10-CM | POA: Diagnosis not present

## 2022-09-04 DIAGNOSIS — Z23 Encounter for immunization: Secondary | ICD-10-CM | POA: Diagnosis not present

## 2022-09-04 DIAGNOSIS — I2510 Atherosclerotic heart disease of native coronary artery without angina pectoris: Secondary | ICD-10-CM | POA: Insufficient documentation

## 2022-09-04 DIAGNOSIS — I2584 Coronary atherosclerosis due to calcified coronary lesion: Secondary | ICD-10-CM | POA: Insufficient documentation

## 2022-09-04 DIAGNOSIS — M4712 Other spondylosis with myelopathy, cervical region: Secondary | ICD-10-CM | POA: Diagnosis not present

## 2022-09-04 DIAGNOSIS — E039 Hypothyroidism, unspecified: Secondary | ICD-10-CM | POA: Diagnosis not present

## 2022-09-04 DIAGNOSIS — Z7982 Long term (current) use of aspirin: Secondary | ICD-10-CM | POA: Insufficient documentation

## 2022-09-04 DIAGNOSIS — L4050 Arthropathic psoriasis, unspecified: Secondary | ICD-10-CM | POA: Diagnosis not present

## 2022-09-04 DIAGNOSIS — R030 Elevated blood-pressure reading, without diagnosis of hypertension: Secondary | ICD-10-CM | POA: Diagnosis not present

## 2022-09-04 DIAGNOSIS — G4733 Obstructive sleep apnea (adult) (pediatric): Secondary | ICD-10-CM | POA: Diagnosis not present

## 2022-09-04 DIAGNOSIS — E7849 Other hyperlipidemia: Secondary | ICD-10-CM | POA: Diagnosis not present

## 2022-09-04 DIAGNOSIS — R079 Chest pain, unspecified: Secondary | ICD-10-CM

## 2022-09-04 DIAGNOSIS — F5104 Psychophysiologic insomnia: Secondary | ICD-10-CM | POA: Diagnosis not present

## 2022-09-04 DIAGNOSIS — R002 Palpitations: Secondary | ICD-10-CM | POA: Insufficient documentation

## 2022-09-04 DIAGNOSIS — M2550 Pain in unspecified joint: Secondary | ICD-10-CM | POA: Diagnosis not present

## 2022-09-04 DIAGNOSIS — Z6833 Body mass index (BMI) 33.0-33.9, adult: Secondary | ICD-10-CM | POA: Diagnosis not present

## 2022-09-04 LAB — TSH: TSH: 2.304 u[IU]/mL (ref 0.350–4.500)

## 2022-09-04 LAB — BASIC METABOLIC PANEL
Anion gap: 8 (ref 5–15)
BUN: 16 mg/dL (ref 8–23)
CO2: 27 mmol/L (ref 22–32)
Calcium: 9.3 mg/dL (ref 8.9–10.3)
Chloride: 107 mmol/L (ref 98–111)
Creatinine, Ser: 1.14 mg/dL (ref 0.61–1.24)
GFR, Estimated: >60 mL/min (ref >60)
Glucose, Bld: 91 mg/dL (ref 70–99)
Potassium: 3.7 mmol/L (ref 3.5–5.1)
Sodium: 142 mmol/L (ref 135–145)

## 2022-09-04 LAB — CBC
HCT: 36.6 % — ABNORMAL LOW (ref 39.0–52.0)
Hemoglobin: 12.4 g/dL — ABNORMAL LOW (ref 13.0–17.0)
MCH: 37 pg — ABNORMAL HIGH (ref 26.0–34.0)
MCHC: 33.9 g/dL (ref 30.0–36.0)
MCV: 109.3 fL — ABNORMAL HIGH (ref 80.0–100.0)
Platelets: 473 10*3/uL — ABNORMAL HIGH (ref 150–400)
RBC: 3.35 MIL/uL — ABNORMAL LOW (ref 4.22–5.81)
RDW: 12.1 % (ref 11.5–15.5)
WBC: 9.6 10*3/uL (ref 4.0–10.5)
nRBC: 0 % (ref 0.0–0.2)

## 2022-09-04 LAB — MAGNESIUM: Magnesium: 2.4 mg/dL (ref 1.7–2.4)

## 2022-09-04 LAB — TROPONIN I (HIGH SENSITIVITY)
Troponin I (High Sensitivity): 4 ng/L (ref <18)
Troponin I (High Sensitivity): 4 ng/L (ref <18)

## 2022-09-04 NOTE — ED Triage Notes (Signed)
Pt in with heart palpitations onset 2hrs ago while sitting in his office chair. Wife also present, states he has had some recent deaths in the family and is undergoing a lot of stress. Pt denies any sob, did have a sudden "jolt" of cp at initial onset. Hx of thrombocytopenia

## 2022-09-05 ENCOUNTER — Emergency Department (HOSPITAL_COMMUNITY): Payer: Medicare Other

## 2022-09-05 DIAGNOSIS — K449 Diaphragmatic hernia without obstruction or gangrene: Secondary | ICD-10-CM | POA: Diagnosis not present

## 2022-09-05 DIAGNOSIS — R0789 Other chest pain: Secondary | ICD-10-CM | POA: Diagnosis not present

## 2022-09-05 DIAGNOSIS — R002 Palpitations: Secondary | ICD-10-CM | POA: Diagnosis not present

## 2022-09-05 MED ORDER — IOHEXOL 350 MG/ML SOLN
100.0000 mL | Freq: Once | INTRAVENOUS | Status: AC | PRN
  Administered 2022-09-05: 100 mL via INTRAVENOUS

## 2022-09-05 NOTE — ED Notes (Signed)
Pt transported to CT ?

## 2022-09-05 NOTE — ED Provider Notes (Signed)
[facility] EMERGENCY DEPARTMENT Provider Note   CSN: [id number] Arrival date & time: 09/04/22  1848     History  Chief Complaint  Patient presents with   Palpitations    Alfred Jones is a 71 y.o. male.  71 year old male who presents the ER today secondary to an episode of chest pain.  Patient was doing work at a desk.  Not really exert himself or do anything else only thinking about his paperwork when a cute onset of some type of abnormal sensation in his left chest.  Patient states that felt like it took his breath away for split-second but then he had an abnormal feeling that was not the same for an hour or so afterwards.  No history of anxiety.  Does not think was a panic attack.  No history of indigestion or anything like that I think was GERD.  No lower extremity swelling that does not go away at night.  He does have some dependent edema when sit in chairs but usually goes away with ambulation or laying flat.  No persistent shortness of breath.  No history of heart failure or heart attacks.  He does have a strong family history of coronary artery disease including multiple siblings that are younger than him.  Never had A-fib.  Eating drinking normally.  No change in caffeine intake.  No alcohol drugs or tobacco.   Palpitations      Home Medications Prior to Admission medications   Medication Sig Start Date End Date Taking? Authorizing Provider  acetaminophen (TYLENOL) 500 MG tablet Take 1,000 mg by mouth 2 (two) times daily.    [provider]  alfuzosin (UROXATRAL) 10 MG 24 hr tablet Take 1 tablet (10 mg total) by mouth daily with breakfast. 07/27/21   Irine Seal, MD  aspirin 325 MG tablet Take 325 mg by mouth daily.    [provider]  celecoxib (CELEBREX) 200 MG capsule Take 200 mg by mouth 2 (two) times daily. 07/21/21   [provider]  cetirizine (ZYRTEC) 10 MG tablet Take 2 tablets by mouth daily.    [provider]  Cholecalciferol  (VITAMIN D3) 250 MCG (10000 UT) capsule Take 10,000 Units by mouth daily.    [provider]  CINNAMON PO Take 1,200 mg by mouth daily.    [provider]  clotrimazole (LOTRIMIN) 1 % cream Apply 1 application topically at bedtime. Feet    [provider]  Co-Enzyme Q-10 100 MG CAPS Take 100 mg by mouth daily.    [provider]  Cyanocobalamin (VITAMIN B-12 PO) Take by mouth daily.    [provider]  diclofenac Sodium (VOLTAREN) 1 % GEL Apply 2 g topically at bedtime. Joint pain    [provider]  docusate sodium (COLACE) 100 MG capsule Take 200 mg by mouth 2 (two) times daily.    [provider]  DULoxetine (CYMBALTA) 60 MG capsule Take 60 mg by mouth daily.    [provider]  fexofenadine (ALLEGRA) 180 MG tablet Take 180 mg by mouth daily.    [provider]  FIBER PO Take by mouth daily. Natural fibers found in fruits and vegetables.    [provider]  Flaxseed, Linseed, (FLAX SEED OIL) 1000 MG CAPS Take by mouth.    [provider]  folic acid (FOLVITE) 1 MG tablet Take 1 mg by mouth daily.    [provider]  hydroxyurea (HYDREA) 500 MG capsule Take 500 mg by  mouth daily. 1000 mg on Monday and Thursday    [provider]  ketoconazole (NIZORAL) 2 % cream Apply 1 application. topically daily.    [provider]  linaclotide (LINZESS) 290 MCG CAPS capsule Take 290 mcg by mouth daily before breakfast.    [provider]  Melatonin 10 MG CAPS Take 10 mg by mouth at bedtime.     [provider]  methocarbamol (ROBAXIN) 500 MG tablet Take 500 mg by mouth daily.    [provider]  Multiple Vitamins-Minerals (MULTIVITAMIN MEN 50+ PO) Take by mouth daily.    [provider]  OVER THE COUNTER MEDICATION Take 1,000 mg by mouth daily. Beet root    [provider]  Oxycodone HCl 10 MG TABS Take by mouth. 07/25/21   [provider]  pantoprazole (PROTONIX) 40 MG tablet Take 1 tablet by mouth 2 (two) times daily. 01/11/21   [provider]  polyethylene glycol (MIRALAX / GLYCOLAX) 17 g packet Take 17 g by mouth daily as needed.    [provider]  Polyvinyl Alcohol-Povidone (REFRESH OP) Apply to eye 2 (two) times daily.    [provider]  pregabalin (LYRICA) 50 MG capsule Take by mouth 2 (two) times daily. 01/24/22   [provider]  rosuvastatin (CRESTOR) 5 MG tablet Take 5 mg by mouth 2 (two) times a week.    [provider]  tadalafil (CIALIS) 5 MG tablet Take 1 tablet (5 mg total) by mouth daily. 07/27/21   Irine Seal, MD  Tart Cherry 1200 MG CAPS Take by mouth.    [provider]  traZODone (DESYREL) 50 MG tablet Take 25 mg by mouth at bedtime.    [provider]  TURMERIC PO Take by mouth at bedtime.    [provider]      Allergies    Patient has no known allergies.    Review of Systems   Review of Systems  Cardiovascular:  Positive for palpitations.    Physical Exam Updated Vital Signs BP 132/79   Pulse 77   Temp 97.9 F (36.6 C) (Oral)   Resp 17   Wt 101.3 kg   SpO2 100%   BMI 31.15 kg/m  Physical Exam Vitals and nursing note reviewed.  Constitutional:      Appearance: He is well-developed.  HENT:     Head: Normocephalic and atraumatic.     Mouth/Throat:     Mouth: Mucous membranes are moist.     Pharynx: Oropharynx is clear.  Eyes:     Pupils: Pupils are equal, round, and reactive to light.  Cardiovascular:     Rate and Rhythm: Normal rate.  Pulmonary:     Effort: Pulmonary effort is normal. No respiratory distress.  Abdominal:     General: Abdomen is flat. There is no distension.  Musculoskeletal:        General: Normal range of motion.     Cervical back: Normal range of motion.  Skin:    General: Skin is warm and dry.  Neurological:     General: No focal deficit present.     Mental Status: He  is alert.     ED Results / Procedures / Treatments   Labs (all labs ordered are listed, but only abnormal results are displayed) Labs Reviewed  CBC - Abnormal; Notable for the following components:      Result Value   RBC 3.35 (*)    Hemoglobin 12.4 (*)  HCT 36.6 (*)    MCV 109.3 (*)    MCH 37.0 (*)    Platelets 473 (*)    All other components within normal limits  BASIC METABOLIC PANEL  TSH  MAGNESIUM  TROPONIN I (HIGH SENSITIVITY)  TROPONIN I (HIGH SENSITIVITY)    EKG EKG Interpretation  Date/Time:  Tuesday September 04 2022 19:13:21 EST Ventricular Rate:  83 PR Interval:  148 QRS Duration: 90 QT Interval:  384 QTC Calculation: 451 R Axis:   -16 Text Interpretation: Sinus rhythm with Premature atrial complexes Cannot rule out Anterior infarct , age undetermined Abnormal ECG No previous ECGs available Confirmed by Wynona Dove ([id number]) on 09/04/2022 7:18:10 PM  Radiology CT Angio Chest PE W and/or Wo Contrast  Result Date: 09/05/2022 CLINICAL DATA:  Pulmonary embolism (PE) suspected, high prob. Palpitations EXAM: CT ANGIOGRAPHY CHEST WITH CONTRAST TECHNIQUE: Multidetector CT imaging of the chest was performed using the standard protocol during bolus administration of intravenous contrast. Multiplanar CT image reconstructions and MIPs were obtained to evaluate the vascular anatomy. RADIATION DOSE REDUCTION: This exam was performed according to the departmental dose-optimization program which includes automated exposure control, adjustment of the mA and/or kV according to patient size and/or use of iterative reconstruction technique. CONTRAST:  187m OMNIPAQUE IOHEXOL 350 MG/ML SOLN COMPARISON:  None Available. FINDINGS: Cardiovascular: Adequate opacification of the pulmonary arterial tree. No intraluminal filling defect identified to suggest acute pulmonary embolism. Central pulmonary arteries are of normal caliber. Mild coronary artery calcification. Cardiac size within normal  limits. No pericardial effusion. Mild atherosclerotic calcification within the thoracic aorta. No aortic aneurysm. Mediastinum/Nodes: Visualized thyroid is unremarkable. No pathologic thoracic adenopathy. Esophagus unremarkable. Small hiatal hernia. Lungs/Pleura: Lungs are clear. No pneumothorax or pleural effusion. Mild bronchial wall thickening is present in keeping with airway inflammation. No central obstructing lesion. Upper Abdomen: No acute abnormality. Musculoskeletal: Osseous structures are age-appropriate. No acute bone abnormality. Review of the MIP images confirms the above findings. IMPRESSION: 1. No pulmonary embolism. 2. Mild coronary artery calcification. 3. Mild bronchial wall thickening in keeping with airway inflammation. No central obstructing lesion. 4. Small hiatal hernia. Aortic Atherosclerosis (ICD10-I70.0). Electronically Signed   By: AFidela SalisburyM.D.   On: 09/05/2022 01:31   DG Chest 2 View  Result Date: 09/04/2022 CLINICAL DATA:  Chest pain and palpitations. EXAM: CHEST - 2 VIEW COMPARISON:  02/09/2010 FINDINGS: Normal sized heart. Mild peribronchial thickening without significant change. Clear lungs. Lower thoracic spine degenerative changes. Cervical spine fixation hardware. IMPRESSION: 1. No acute abnormality. 2. Stable mild chronic bronchitic changes. Electronically Signed   By: SClaudie ReveringM.D.   On: 09/04/2022 19:44    Procedures Procedures    Medications Ordered in ED Medications  iohexol (OMNIPAQUE) 350 MG/ML injection 100 mL (100 mLs Intravenous Contrast Given 09/05/22 0103)    ED Course/ Medical Decision Making/ A&P                           Medical Decision Making Amount and/or Complexity of Data Reviewed Labs: ordered. Radiology: ordered.  Risk Prescription drug management.   CT done to evaluate for PE and evaluated by myself without any obvious large clot burden.  Does have some coronary calcifications as noticed by the radiologist.  I will  refer to cardiology for possible arrhythmias to include A-fib or SVT and probably needs cardiac monitoring with the calcifications and chest pain and his family history probably needs some type of provocative work-up as well.  Will defer to cardiology for the same.  His troponins and EKG here are reassuring I doubt that he had an ACS event however could have been some type of vasospasm or something.  He is already on aspirin for thrombocytosis I do not see any indication for further medication changes or work-up in the ER.  I did discuss to return for new or worsening symptoms.   Final Clinical Impression(s) / ED Diagnoses Final diagnoses:  Nonspecific chest pain  Coronary artery calcification    Rx / DC Orders ED Discharge Orders          Ordered    Ambulatory referral to Cardiology        09/05/22 0159              Costas Sena, Corene Cornea, MD 09/05/22 [phone]

## 2022-09-13 ENCOUNTER — Other Ambulatory Visit: Payer: Self-pay | Admitting: Urology

## 2022-09-24 DIAGNOSIS — I7381 Erythromelalgia: Secondary | ICD-10-CM | POA: Diagnosis not present

## 2022-09-24 DIAGNOSIS — M129 Arthropathy, unspecified: Secondary | ICD-10-CM | POA: Diagnosis not present

## 2022-09-24 DIAGNOSIS — D473 Essential (hemorrhagic) thrombocythemia: Secondary | ICD-10-CM | POA: Diagnosis not present

## 2022-10-01 DIAGNOSIS — D473 Essential (hemorrhagic) thrombocythemia: Secondary | ICD-10-CM | POA: Diagnosis not present

## 2022-10-01 DIAGNOSIS — M5416 Radiculopathy, lumbar region: Secondary | ICD-10-CM | POA: Diagnosis not present

## 2022-10-01 DIAGNOSIS — R5382 Chronic fatigue, unspecified: Secondary | ICD-10-CM | POA: Diagnosis not present

## 2022-10-02 ENCOUNTER — Encounter: Payer: Self-pay | Admitting: *Deleted

## 2022-10-02 ENCOUNTER — Ambulatory Visit: Payer: Medicare Other | Attending: Internal Medicine | Admitting: Internal Medicine

## 2022-10-02 ENCOUNTER — Ambulatory Visit: Payer: Medicare Other | Admitting: Internal Medicine

## 2022-10-02 ENCOUNTER — Encounter: Payer: Self-pay | Admitting: Internal Medicine

## 2022-10-02 VITALS — BP 110/56 | HR 72 | Ht 71.0 in | Wt 229.4 lb

## 2022-10-02 DIAGNOSIS — R079 Chest pain, unspecified: Secondary | ICD-10-CM | POA: Diagnosis not present

## 2022-10-02 DIAGNOSIS — Z87898 Personal history of other specified conditions: Secondary | ICD-10-CM | POA: Diagnosis not present

## 2022-10-02 DIAGNOSIS — R4781 Slurred speech: Secondary | ICD-10-CM | POA: Diagnosis not present

## 2022-10-02 DIAGNOSIS — E162 Hypoglycemia, unspecified: Secondary | ICD-10-CM | POA: Diagnosis not present

## 2022-10-02 NOTE — Progress Notes (Signed)
Cardiology Office Note  Date: 10/02/2022   ID: Alfred Jones, DOB Aug 02, 1951, MRN [id number]  PCP:  Curlene Labrum, MD  Cardiologist:  Chalmers Guest, MD Electrophysiologist:  None   Reason for Office Visit: Chest pain evaluation at the request of Dr. Dayna Barker   History of Present Illness: Alfred Jones is a 71 y.o. male known to have essential thrombocytosis (on hydroxyurea), HLD was referred to cardiology clinic for evaluation of chest pain.  Patient had ER visit on 09/04/2022 with sudden onset of SOB and nonspecific chest discomfort for which he went to the ER.  His labs, troponins and EKG were within normal limits. Patient has been having 1-2 times per month of exertional substernal chest discomfort radiating to his left arm lasting for 20 minutes. He does not have SL NTG 0.4 mg with him and he is also concurrently on tadalafil. Denies DOE, dizziness/lightheadedness, syncope, palpitations and LE swelling. He does have joint pains all over his body and he is limiting his ambulation.  He does have significant history of PCI/CABG in his family especially his brothers in 40-50s. His father had CABG in his late 33s. Former smoker, quit 30 years ago, denied alcohol use and illicit drug use. He also reported having hypoglycemia after eating food and also noticed his face becoming red x 6 months. Slurry speech sometimes.  Past Medical History:  Diagnosis Date   Acid reflux    Anxiety    Arthritis    RHEUMATOID OR PSORIATIC ?   BCC (basal cell carcinoma of skin) 06/19/2007   left lower chest (CX35FU)   BCC (basal cell carcinoma of skin) infiltrated 03/11/2018   right nose (MOHS)   BCC (basal cell carcinoma of skin) ulcerated 07/11/2010   left cheek    Cancer ([facility])    Depression    Enlarged prostate    Hyperlipidemia    Numbness    Sleep apnea    TESTED BACK IN 2017   Superficial basal cell carcinoma (BCC) 03/11/2018   left shoulder     Past Surgical History:  Procedure  Laterality Date   ANTERIOR CERVICAL DECOMP/DISCECTOMY FUSION N/A 09/23/2018   Procedure: Cervical Three-Four Cervical Four-Five Cervical Five-Six Anterior cervical decompression/discectomy/fusion;  Surgeon: Erline Levine, MD;  Location: [facility];  Service: Neurosurgery;  Laterality: N/A;  Cervical Three-Four Cervical Four-Five Cervical Five-Six Anterior cervical decompression/discectomy/fusion   CARDIAC CATHETERIZATION     20 YRS AGO AT [facility]   CATARACT EXTRACTION Bilateral 2023   COLONOSCOPY WITH PROPOFOL N/A 06/23/2020   One small polyp at hepatic flexure that was sessile and ablated without specimen for pathologist. Surveillance 10 years if overall health permits.   ESOPHAGOGASTRODUODENOSCOPY (EGD) WITH PROPOFOL N/A 08/13/2019   prominent Schatzki ring s/p dilation, overlying erosive reflux esophagitis, small hiatal hernia   EYE SURGERY     METAL REMOVED FROM LEFT EYE  (IN OFFICE)   EYE SURGERY Left 10/2021   retinal tear   MALONEY DILATION N/A 08/13/2019   Procedure: MALONEY DILATION;  Surgeon: Daneil Dolin, MD;  [facility];  Service: Endoscopy;  Laterality: N/A;   MOLE REMOVAL     POLYPECTOMY  06/23/2020   Procedure: POLYPECTOMY;  Surgeon: Daneil Dolin, MD;  [facility];  Service: Endoscopy;;   TONSILLECTOMY     as a child    Current Outpatient Medications  Medication Sig Dispense Refill   acetaminophen (TYLENOL) 500 MG tablet Take 1,000 mg by mouth 2 (two) times daily.  alfuzosin (UROXATRAL) 10 MG 24 hr tablet Take 1 tablet (10 mg total) by mouth daily with breakfast. 90 tablet 3   aspirin 325 MG tablet Take 325 mg by mouth daily.     celecoxib (CELEBREX) 200 MG capsule Take 200 mg by mouth 2 (two) times daily.     cetirizine (ZYRTEC) 10 MG tablet Take 2 tablets by mouth daily.     Cholecalciferol (VITAMIN D3) 250 MCG (10000 UT) capsule Take 10,000 Units by mouth daily.     CINNAMON PO Take 1,200 mg by mouth daily.     Co-Enzyme Q-10 100 MG CAPS  Take 100 mg by mouth daily.     Cyanocobalamin (VITAMIN B-12 PO) Take by mouth daily.     diclofenac Sodium (VOLTAREN) 1 % GEL Apply 2 g topically at bedtime. Joint pain     DULoxetine (CYMBALTA) 60 MG capsule Take 60 mg by mouth daily.     fexofenadine (ALLEGRA) 180 MG tablet Take 180 mg by mouth daily.     Flaxseed, Linseed, (FLAX SEED OIL) 1000 MG CAPS Take by mouth.     folic acid (FOLVITE) 1 MG tablet Take 1 mg by mouth daily.     hydroxyurea (HYDREA) 500 MG capsule Take 500 mg by mouth as directed. 500 mg on Monday and Thursday     levothyroxine (SYNTHROID) 50 MCG tablet Take 50 mcg by mouth daily.     linaclotide (LINZESS) 290 MCG CAPS capsule Take 290 mcg by mouth daily before breakfast.     Melatonin 10 MG CAPS Take 10 mg by mouth at bedtime.      methocarbamol (ROBAXIN) 500 MG tablet Take 500 mg by mouth daily.     Multiple Vitamins-Minerals (MULTIVITAMIN MEN 50+ PO) Take by mouth daily.     Oxycodone HCl 10 MG TABS Take by mouth.     pantoprazole (PROTONIX) 40 MG tablet Take 1 tablet by mouth 2 (two) times daily.     polyethylene glycol (MIRALAX / GLYCOLAX) 17 g packet Take 17 g by mouth daily as needed.     Polyvinyl Alcohol-Povidone (REFRESH OP) Apply to eye 2 (two) times daily.     pregabalin (LYRICA) 50 MG capsule Take by mouth 2 (two) times daily.     rosuvastatin (CRESTOR) 5 MG tablet Take 5 mg by mouth 2 (two) times a week.     tadalafil (CIALIS) 5 MG tablet TAKE ONE TABLET BY MOUTH EVERY DAY 90 tablet 3   traZODone (DESYREL) 50 MG tablet Take 25 mg by mouth at bedtime.     TURMERIC PO Take by mouth at bedtime.     clotrimazole (LOTRIMIN) 1 % cream Apply 1 application topically at bedtime. Feet     docusate sodium (COLACE) 100 MG capsule Take 200 mg by mouth 2 (two) times daily. (Patient not taking: Reported on 10/02/2022)     FIBER PO Take by mouth daily. Natural fibers found in fruits and vegetables. (Patient not taking: Reported on 10/02/2022)     ketoconazole (NIZORAL)  2 % cream Apply 1 application. topically daily.     OVER THE COUNTER MEDICATION Take 1,000 mg by mouth daily. Beet root (Patient not taking: Reported on 10/02/2022)     Tart Cherry 1200 MG CAPS Take by mouth. (Patient not taking: Reported on 10/02/2022)     No current facility-administered medications for this visit.   Allergies:  Patient has no known allergies.   Social History: The patient  reports that he quit smoking about 35  years ago. His smoking use included cigarettes. He has a 16.00 pack-year smoking history. He has been exposed to tobacco smoke. He has never used smokeless tobacco. He reports that he does not drink alcohol and does not use drugs.   Family History: The patient's family history includes Diabetes in his brother, brother, father, mother, and sister; Heart attack in his brother, brother, brother, brother, and mother; Heart disease in his father and mother; Hypothyroidism in his daughter and son; Seizures in his sister; Sleep apnea in his brother and brother.   ROS:  Please see the history of present illness. Otherwise, complete review of systems is positive for none.  All other systems are reviewed and negative.   Physical Exam: VS:  BP (!) 110/56   Pulse 72   Ht '5\' 11"'$  (1.803 m)   Wt 229 lb 6.4 oz (104.1 kg)   SpO2 98%   BMI 31.99 kg/m , BMI Body mass index is 31.99 kg/m.  Wt Readings from Last 3 Encounters:  10/02/22 229 lb 6.4 oz (104.1 kg)  09/04/22 223 lb 5.2 oz (101.3 kg)  07/10/22 223 lb 6.4 oz (101.3 kg)    General: Patient appears comfortable at rest. HEENT: Conjunctiva and lids normal, oropharynx clear with moist mucosa. Neck: Supple, no elevated JVP, no thyromegaly. Lungs: Clear to auscultation, nonlabored breathing at rest. Cardiac: Regular rate and rhythm, no S3 or significant systolic murmur, no pericardial rub. Abdomen: Soft, nontender, no hepatomegaly, bowel sounds present, no guarding or rebound. Extremities: No pitting edema, distal pulses  2+. Skin: Warm and dry. Musculoskeletal: No kyphosis. Neuropsychiatric: Alert and oriented x3, affect grossly appropriate.  ECG:  NSR and no ST-T changes  Recent Labwork: 09/04/2022: BUN 16; Creatinine, Ser 1.14; Hemoglobin 12.4; Magnesium 2.4; Platelets 473; Potassium 3.7; Sodium 142; TSH 2.304     Component Value Date/Time   CHOL 196 04/15/2018 1043   TRIG 230 (H) 04/15/2018 1043   HDL 33 (L) 04/15/2018 1043   CHOLHDL 5.9 (H) 04/15/2018 1043   CHOLHDL 4.8 12/02/2015 0807   VLDL 29 12/02/2015 0807   LDLCALC 117 (H) 04/15/2018 1043    Other Studies Reviewed Today:   Assessment and Plan: Patient is a 71 y/o M known to have essential thrombocytosis (on hydroxyurea), HLD was referred to cardiology clinic for evaluation of chest pain.  # Cardiac chest pain # Family history of CAD -Patient has stable cardiac chest pain, 1-2 times per month. I will obtain Lexiscan and 2D echocardiogram. Cannot prescribe SL NTG 0.4 mg as needed due to concurrent use of tadalafil. He did not think he requires metoprolol at this point as they are occasional and not frequent. But he knows to call the clinic if it becomes frequent and to request for metoprolol in the future.  #Hx of slurry speech -Obtain USG carotids  # HLD, unknown values -Continue rosuvastatin 5 mg nightly.  HLD management per PCP.  # Post-prandial hypoglycemia -Endocrine referral  I have spent a total of 45 minutes with patient reviewing chart , telemetry, EKGs, labs and examining patient as well as establishing an assessment and plan that was discussed with the patient.  > 50% of time was spent in direct patient care.      Medication Adjustments/Labs and Tests Ordered: Current medicines are reviewed at length with the patient today.  Concerns regarding medicines are outlined above.   Tests Ordered: Orders Placed This Encounter  Procedures   NM Myocar Multi W/Spect W/Wall Motion / EF   Ambulatory referral  to Endocrinology    ECHOCARDIOGRAM COMPLETE   VAS US CAROTID    Medication Changes: No orders of the defined types were placed in this encounter.   Disposition:  Follow up  6 months  Signed Densil Ottey Fidel Levy, MD, 10/02/2022 12:43 PM    [facility], [place], [place] [address]

## 2022-10-02 NOTE — Patient Instructions (Addendum)
Medication Instructions:  Your physician recommends that you continue on your current medications as directed. Please refer to the Current Medication list given to you today.  Labwork: none  Testing/Procedures: Your physician has requested that you have an echocardiogram. Echocardiography is a painless test that uses sound waves to create images of your heart. It provides your doctor with information about the size and shape of your heart and how well your heart's chambers and valves are working. This procedure takes approximately one hour. There are no restrictions for this procedure. Please do NOT wear cologne, perfume, aftershave, or lotions (deodorant is allowed). Please arrive 15 minutes prior to your appointment time. Your physician has requested that you have a carotid duplex. This test is an ultrasound of the carotid arteries in your neck. It looks at blood flow through these arteries that supply the brain with blood. Allow one hour for this exam. There are no restrictions or special instructions. Your physician has requested that you have a lexiscan myoview. For further information please visit [email or web address]. Please follow instruction sheet, as given.  Follow-Up: Your physician recommends that you schedule a follow-up appointment in: 6 months  Any Other Special Instructions Will Be Listed Below (If Applicable). You have been referred to an Endocrinologist  If you need a refill on your cardiac medications before your next appointment, please call your pharmacy.

## 2022-10-10 ENCOUNTER — Ambulatory Visit (HOSPITAL_COMMUNITY)
Admission: RE | Admit: 2022-10-10 | Discharge: 2022-10-10 | Disposition: A | Discharge status: Home / Self Care | Payer: Medicare Other | Source: Ambulatory Visit | Attending: Internal Medicine | Admitting: Internal Medicine

## 2022-10-10 ENCOUNTER — Encounter (HOSPITAL_COMMUNITY)
Admission: RE | Admit: 2022-10-10 | Discharge: 2022-10-10 | Disposition: A | Discharge status: Home / Self Care | Payer: Medicare Other | Source: Ambulatory Visit | Attending: Internal Medicine | Admitting: Internal Medicine

## 2022-10-10 DIAGNOSIS — R079 Chest pain, unspecified: Secondary | ICD-10-CM | POA: Diagnosis not present

## 2022-10-10 LAB — NM MYOCAR MULTI W/SPECT W/WALL MOTION / EF
Base ST Depression (mm): 0 mm
Estimated workload: 1
Exercise duration (min): 0 min
Exercise duration (sec): 0 s
LV dias vol: 97 mL (ref 62–150)
LV sys vol: 36 mL
MPHR: 151 {beats}/min
Nuc Stress EF: 63 %
Peak HR: 90 {beats}/min
Percent HR: 59 %
RATE: 0.3
Rest HR: 59 {beats}/min
Rest Nuclear Isotope Dose: 11 mCi
SDS: 1
SRS: 0
SSS: 1
ST Depression (mm): 0 mm
Stress Nuclear Isotope Dose: 33 mCi
TID: 1.03

## 2022-10-10 MED ORDER — REGADENOSON 0.4 MG/5ML IV SOLN
INTRAVENOUS | Status: AC
  Administered 2022-10-10: 0.4 mg via INTRAVENOUS
  Filled 2022-10-10: qty 5

## 2022-10-10 MED ORDER — SODIUM CHLORIDE FLUSH 0.9 % IV SOLN
INTRAVENOUS | Status: AC
  Administered 2022-10-10: 10 mL via INTRAVENOUS
  Filled 2022-10-10: qty 10

## 2022-10-10 MED ORDER — TECHNETIUM TC 99M TETROFOSMIN IV KIT
30.0000 | PACK | Freq: Once | INTRAVENOUS | Status: AC | PRN
  Administered 2022-10-10: 33 via INTRAVENOUS

## 2022-10-10 MED ORDER — TECHNETIUM TC 99M TETROFOSMIN IV KIT
10.0000 | PACK | Freq: Once | INTRAVENOUS | Status: AC | PRN
  Administered 2022-10-10: 11 via INTRAVENOUS

## 2022-10-16 ENCOUNTER — Telehealth: Payer: Self-pay

## 2022-10-16 NOTE — Telephone Encounter (Signed)
Patients wife (Ok per [employer]) notified and verbalized understanding. Patients wife had no questions or concerns at this time. PCP Copied.

## 2022-10-16 NOTE — Telephone Encounter (Signed)
-----   Message from Chalmers Guest, MD sent at 10/16/2022  9:42 AM EST ----- No evidence of ischemia. Normal stress test.

## 2022-10-19 ENCOUNTER — Ambulatory Visit: Payer: Medicare Other | Attending: Internal Medicine

## 2022-10-19 ENCOUNTER — Ambulatory Visit (INDEPENDENT_AMBULATORY_CARE_PROVIDER_SITE_OTHER): Payer: Medicare Other

## 2022-10-19 DIAGNOSIS — Z8673 Personal history of transient ischemic attack (TIA), and cerebral infarction without residual deficits: Secondary | ICD-10-CM | POA: Insufficient documentation

## 2022-10-19 DIAGNOSIS — R4781 Slurred speech: Secondary | ICD-10-CM

## 2022-10-19 DIAGNOSIS — R079 Chest pain, unspecified: Secondary | ICD-10-CM | POA: Diagnosis not present

## 2022-10-23 LAB — ECHOCARDIOGRAM COMPLETE
AR max vel: 2.18 cm2
AV Area VTI: 2.44 cm2
AV Area mean vel: 2.25 cm2
AV Mean grad: 4.5 mmHg
AV Peak grad: 10 mmHg
Ao pk vel: 1.58 m/s
Area-P 1/2: 3.68 cm2
Calc EF: 67.6 %
MV M vel: 4.43 m/s
MV Peak grad: 78.5 mmHg
P 1/2 time: 1762 ms
S' Lateral: 2.7 cm
Single Plane A2C EF: 68.4 %
Single Plane A4C EF: 66.4 %

## 2022-11-05 ENCOUNTER — Telehealth: Payer: Self-pay | Admitting: Internal Medicine

## 2022-11-05 NOTE — Telephone Encounter (Signed)
Patient's wife called for results to his Carotid and Echo tests.

## 2022-11-05 NOTE — Telephone Encounter (Signed)
Normal Echo. Follow up in 6 months.   Vishnu P Mallipeddi, MD 11/02/2022  6:56 AM EST Back to Top    Minimal (1-39%) carotid artery stenosis bilaterally.     Wife notified,copied pcp

## 2022-11-30 DIAGNOSIS — D519 Vitamin B12 deficiency anemia, unspecified: Secondary | ICD-10-CM | POA: Diagnosis not present

## 2022-11-30 DIAGNOSIS — K219 Gastro-esophageal reflux disease without esophagitis: Secondary | ICD-10-CM | POA: Diagnosis not present

## 2022-11-30 DIAGNOSIS — D649 Anemia, unspecified: Secondary | ICD-10-CM | POA: Diagnosis not present

## 2022-11-30 DIAGNOSIS — E559 Vitamin D deficiency, unspecified: Secondary | ICD-10-CM | POA: Diagnosis not present

## 2022-11-30 DIAGNOSIS — E039 Hypothyroidism, unspecified: Secondary | ICD-10-CM | POA: Diagnosis not present

## 2022-11-30 DIAGNOSIS — R5383 Other fatigue: Secondary | ICD-10-CM | POA: Diagnosis not present

## 2022-11-30 DIAGNOSIS — R7989 Other specified abnormal findings of blood chemistry: Secondary | ICD-10-CM | POA: Diagnosis not present

## 2022-11-30 DIAGNOSIS — E782 Mixed hyperlipidemia: Secondary | ICD-10-CM | POA: Diagnosis not present

## 2022-12-03 DIAGNOSIS — H04123 Dry eye syndrome of bilateral lacrimal glands: Secondary | ICD-10-CM | POA: Diagnosis not present

## 2022-12-04 ENCOUNTER — Ambulatory Visit (INDEPENDENT_AMBULATORY_CARE_PROVIDER_SITE_OTHER): Payer: Medicare Other | Admitting: Urology

## 2022-12-04 DIAGNOSIS — R109 Unspecified abdominal pain: Secondary | ICD-10-CM | POA: Diagnosis not present

## 2022-12-04 LAB — URINALYSIS, ROUTINE W REFLEX MICROSCOPIC
Bilirubin, UA: NEGATIVE
Glucose, UA: NEGATIVE
Ketones, UA: NEGATIVE
Leukocytes,UA: NEGATIVE
Nitrite, UA: NEGATIVE
Protein,UA: NEGATIVE
RBC, UA: NEGATIVE
Specific Gravity, UA: 1.02 (ref 1.005–1.030)
Urobilinogen, Ur: 2 mg/dL — ABNORMAL HIGH (ref 0.2–1.0)
pH, UA: 6 (ref 5.0–7.5)

## 2022-12-04 NOTE — Progress Notes (Signed)
Patient presents today for flank pain. Ua done in office Dr. Alyson Ingles reviewed results.  Made patient's wife aware that per MD urine was clear and no culture needed.  Patient's wife states he has a temp of 99.3 patient and complaining of flank pain. She states he is taking nsaids and pain meds during the day and noticed he is now having trouble voiding.  Patient scheduled for an OV with Dr. Alyson Ingles on 02/14.

## 2022-12-05 ENCOUNTER — Ambulatory Visit (INDEPENDENT_AMBULATORY_CARE_PROVIDER_SITE_OTHER): Payer: Medicare Other | Admitting: Urology

## 2022-12-05 VITALS — BP 151/71 | HR 111

## 2022-12-05 DIAGNOSIS — R109 Unspecified abdominal pain: Secondary | ICD-10-CM

## 2022-12-05 DIAGNOSIS — R350 Frequency of micturition: Secondary | ICD-10-CM | POA: Diagnosis not present

## 2022-12-05 DIAGNOSIS — R3912 Poor urinary stream: Secondary | ICD-10-CM | POA: Diagnosis not present

## 2022-12-05 DIAGNOSIS — N401 Enlarged prostate with lower urinary tract symptoms: Secondary | ICD-10-CM | POA: Diagnosis not present

## 2022-12-05 DIAGNOSIS — N138 Other obstructive and reflux uropathy: Secondary | ICD-10-CM | POA: Diagnosis not present

## 2022-12-05 DIAGNOSIS — R3915 Urgency of urination: Secondary | ICD-10-CM | POA: Diagnosis not present

## 2022-12-05 DIAGNOSIS — N411 Chronic prostatitis: Secondary | ICD-10-CM | POA: Diagnosis not present

## 2022-12-05 LAB — BLADDER SCAN AMB NON-IMAGING: Scan Result: 21

## 2022-12-05 NOTE — Progress Notes (Signed)
12/05/2022 4:09 PM   Alfred Jones Nov 06, 1950 [id number]  Referring provider: Juliette Alcide, MD [address] [place],  [place] [address]  No chief complaint on file.   HPI: Alfred Jones is a 72yo here for followup for BPH, weak stream and prostatitis. For the past week he has noted increased urinary frequency, urgency, weak urinary stream. No dysuria or gross hematuria.  IPSS 23 QOL on uroxatral 10mg .    PMH: Past Medical History:  Diagnosis Date   Acid reflux    Anxiety    Arthritis    RHEUMATOID OR PSORIATIC ?   BCC (basal cell carcinoma of skin) 06/19/2007   left lower chest (CX35FU)   BCC (basal cell carcinoma of skin) infiltrated 03/11/2018   right nose ([facility]HS)   BCC (basal cell carcinoma of skin) ulcerated 07/11/2010   left cheek    Cancer ([facility])    Depression    Enlarged prostate    Hyperlipidemia    Numbness    Sleep apnea    TESTED BACK IN 2017   Superficial basal cell carcinoma (BCC) 03/11/2018   left shoulder     Surgical History: Past Surgical History:  Procedure Laterality Date   ANTERIOR CERVICAL DECOMP/DISCECTOMY FUSION N/A 09/23/2018   Procedure: Cervical Three-Four Cervical Four-Five Cervical Five-Six Anterior cervical decompression/discectomy/fusion;  Surgeon: Maeola Harman, MD;  Location: [facility] OR;  Service: Neurosurgery;  Laterality: N/A;  Cervical Three-Four Cervical Four-Five Cervical Five-Six Anterior cervical decompression/discectomy/fusion   CARDIAC CATHETERIZATION     20 YRS AGO AT [facility]   CATARACT EXTRACTION Bilateral 2023   COLONOSCOPY WITH PROPOFOL N/A 06/23/2020   One small polyp at hepatic flexure that was sessile and ablated without specimen for pathologist. Surveillance 10 years if overall health permits.   ESOPHAGOGASTRODUODENOSCOPY (EGD) WITH PROPOFOL N/A 08/13/2019   prominent Schatzki ring s/p dilation, overlying erosive reflux esophagitis, small hiatal hernia   EYE SURGERY     METAL REMOVED FROM LEFT EYE  (IN OFFICE)   EYE SURGERY  Left 10/2021   retinal tear   MALONEY DILATION N/A 08/13/2019   Procedure: MALONEY DILATION;  Surgeon: Corbin Ade, MD;  [facility];  Service: Endoscopy;  Laterality: N/A;   MOLE REMOVAL     POLYPECTOMY  06/23/2020   Procedure: POLYPECTOMY;  Surgeon: Corbin Ade, MD;  [facility];  Service: Endoscopy;;   TONSILLECTOMY     as a child    Home Medications:  Allergies as of 12/05/2022   No Known Allergies      Medication List        Accurate as of December 05, 2022  4:09 PM. If you have any questions, ask your nurse or doctor.          acetaminophen 500 MG tablet Commonly known as: TYLENOL Take 1,000 mg by mouth 2 (two) times daily.   alfuzosin 10 MG 24 hr tablet Commonly known as: UROXATRAL Take 1 tablet (10 mg total) by mouth daily with breakfast.   aspirin 325 MG tablet Take 325 mg by mouth daily.   celecoxib 200 MG capsule Commonly known as: CELEBREX Take 200 mg by mouth 2 (two) times daily.   cetirizine 10 MG tablet Commonly known as: ZYRTEC Take 2 tablets by mouth daily.   CINNAMON PO Take 1,200 mg by mouth daily.   clotrimazole 1 % cream Commonly known as: LOTRIMIN Apply 1 application topically at bedtime. Feet   Co-Enzyme Q-10 100 MG Caps Take 100 mg by  mouth daily.   diclofenac Sodium 1 % Gel Commonly known as: VOLTAREN Apply 2 g topically at bedtime. Joint pain   docusate sodium 100 MG capsule Commonly known as: COLACE Take 200 mg by mouth 2 (two) times daily.   DULoxetine 60 MG capsule Commonly known as: CYMBALTA Take 60 mg by mouth daily.   fexofenadine 180 MG tablet Commonly known as: ALLEGRA Take 180 mg by mouth daily.   FIBER PO Take by mouth daily. Natural fibers found in fruits and vegetables.   Flax Seed Oil 1000 MG Caps Take by mouth.   folic acid 1 MG tablet Commonly known as: FOLVITE Take 1 mg by mouth daily.   hydroxyurea 500 MG capsule Commonly known as: HYDREA Take 500 mg by mouth  as directed. 500 mg on Monday and Thursday   ketoconazole 2 % cream Commonly known as: NIZORAL Apply 1 application. topically daily.   levothyroxine 50 MCG tablet Commonly known as: SYNTHROID Take 50 mcg by mouth daily.   linaclotide 290 MCG Caps capsule Commonly known as: LINZESS Take 290 mcg by mouth daily before breakfast.   Melatonin 10 MG Caps Take 10 mg by mouth at bedtime.   methocarbamol 500 MG tablet Commonly known as: ROBAXIN Take 500 mg by mouth daily.   MULTIVITAMIN MEN 50+ PO Take by mouth daily.   OVER THE COUNTER MEDICATION Take 1,000 mg by mouth daily. Beet root   Oxycodone HCl 10 MG Tabs Take by mouth.   pantoprazole 40 MG tablet Commonly known as: PROTONIX Take 1 tablet by mouth 2 (two) times daily.   polyethylene glycol 17 g packet Commonly known as: MIRALAX / GLYCOLAX Take 17 g by mouth daily as needed.   pregabalin 50 MG capsule Commonly known as: LYRICA Take by mouth 2 (two) times daily.   REFRESH OP Apply to eye 2 (two) times daily.   rosuvastatin 5 MG tablet Commonly known as: CRESTOR Take 5 mg by mouth 2 (two) times a week.   tadalafil 5 MG tablet Commonly known as: CIALIS TAKE ONE TABLET BY MOUTH EVERY DAY   Tart Cherry 1200 MG Caps Take by mouth.   tiZANidine 4 MG tablet Commonly known as: ZANAFLEX Take by mouth.   traZODone 50 MG tablet Commonly known as: DESYREL Take 25 mg by mouth at bedtime.   TURMERIC PO Take by mouth at bedtime.   VITAMIN B-12 PO Take by mouth daily.   Vitamin D3 250 MCG (10000 UT) Caps Generic drug: Cholecalciferol Take 10,000 Units by mouth daily.        Allergies: No Known Allergies  Family History: Family History  Problem Relation Age of Onset   Heart disease Mother    Diabetes Mother    Heart attack Mother    Heart disease Father    Diabetes Father    Diabetes Sister    Seizures Sister    Diabetes Brother    Heart attack Brother    Sleep apnea Brother    Heart attack  Brother    Sleep apnea Brother    Heart attack Brother    Diabetes Brother    Heart attack Brother    Hypothyroidism Son    Hypothyroidism Daughter    Colon cancer Neg Hx     Social History:  reports that he quit smoking about 36 years ago. His smoking use included cigarettes. He has a 16.00 pack-year smoking history. He has been exposed to tobacco smoke. He has never used smokeless tobacco. He reports  that he does not drink alcohol and does not use drugs.  ROS: All other review of systems were reviewed and are negative except what is noted above in HPI  Physical Exam: BP (!) 151/71   Pulse (!) 111   Constitutional:  Alert and oriented, No acute distress. HEENT: [place] AT, moist mucus membranes.  Trachea midline, no masses. Cardiovascular: No clubbing, cyanosis, or edema. Respiratory: Normal respiratory effort, no increased work of breathing. GI: Abdomen is soft, nontender, nondistended, no abdominal masses GU: No CVA tenderness. Circumcised phallus. No masses/lesions on penis, testis, scrotum. Prostate 40g smooth no nodules no induration.  Lymph: No cervical or inguinal lymphadenopathy. Skin: No rashes, bruises or suspicious lesions. Neurologic: Grossly intact, no focal deficits, moving all 4 extremities. Psychiatric: Normal mood and affect.  Laboratory Data: Lab Results  Component Value Date   WBC 9.6 09/04/2022   HGB 12.4 (L) 09/04/2022   HCT 36.6 (L) 09/04/2022   MCV 109.3 (H) 09/04/2022   PLT 473 (H) 09/04/2022    Lab Results  Component Value Date   CREATININE 1.14 09/04/2022    Lab Results  Component Value Date   PSA 1.12 12/02/2015    No results found for: "TESTOSTERONE"  Lab Results  Component Value Date   HGBA1C 5.2 04/15/2018    Urinalysis    Component Value Date/Time   APPEARANCEUR Clear 12/04/2022 1150   GLUCOSEU Negative 12/04/2022 1150   BILIRUBINUR Negative 12/04/2022 1150   PROTEINUR Negative 12/04/2022 1150   UROBILINOGEN 1.0 01/15/2020 1121    NITRITE Negative 12/04/2022 1150   LEUKOCYTESUR Negative 12/04/2022 1150    Lab Results  Component Value Date   LABMICR Comment 12/04/2022   WBCUA None seen 07/29/2020   LABEPIT None seen 07/29/2020   MUCUS Present 07/29/2020   BACTERIA None seen 07/29/2020    Pertinent Imaging:  Results for orders placed during the hospital encounter of 07/11/20  DG Abd 1 View  Narrative CLINICAL DATA:  Constipation  EXAM: ABDOMEN - 1 VIEW  COMPARISON:  X-ray abdomen 04/18/2018, CT abdomen pelvis 06/03/2012  FINDINGS: The bowel gas pattern is normal. Stool within the ascending and proximal transverse colon. No radio-opaque calculi or other significant radiographic abnormality are seen. Visualized osseous structures are grossly unremarkable.  IMPRESSION: Nonobstructive bowel gas pattern with stool within the ascending colon proximal transverse colon.   Electronically Signed By: Tish Frederickson M.D. On: 07/11/2020 23:52  No results found for this or any previous visit.  No results found for this or any previous visit.  No results found for this or any previous visit.  No results found for this or any previous visit.  No valid procedures specified. No results found for this or any previous visit.  No results found for this or any previous visit.   Assessment & Plan:    prostatitis -doxycycline 100mg  BID for 28 days - Urinalysis, Routine w reflex microscopic  2. Benign prostatic hyperplasia with urinary obstruction, weak urianry stream Uroxatral 10mg  daily - BLADDER SCAN AMB NON-IMAGING   No follow-ups on file.  Wilkie Aye, MD  [facility] [place]

## 2022-12-05 NOTE — Progress Notes (Unsigned)
post void residual=21 °

## 2022-12-06 ENCOUNTER — Telehealth: Payer: Self-pay

## 2022-12-06 ENCOUNTER — Encounter: Payer: Self-pay | Admitting: Urology

## 2022-12-06 LAB — URINALYSIS, ROUTINE W REFLEX MICROSCOPIC
Bilirubin, UA: NEGATIVE
Glucose, UA: NEGATIVE
Ketones, UA: NEGATIVE
Leukocytes,UA: NEGATIVE
Nitrite, UA: NEGATIVE
Protein,UA: NEGATIVE
RBC, UA: NEGATIVE
Specific Gravity, UA: 1.01 (ref 1.005–1.030)
Urobilinogen, Ur: 1 mg/dL (ref 0.2–1.0)
pH, UA: 5.5 (ref 5.0–7.5)

## 2022-12-06 MED ORDER — DOXYCYCLINE HYCLATE 100 MG PO CAPS: 100.0000 mg | ORAL_CAPSULE | Freq: Two times a day (BID) | ORAL | 0 refills | Status: DC

## 2022-12-06 NOTE — Telephone Encounter (Signed)
Patient's antibiotic has not been sent into [facility], [place] Phone: [phone]  Fax: [phone]     Please fill Rx asap.

## 2022-12-06 NOTE — Telephone Encounter (Signed)
Are you treating anything with antibiotic for Alfred Jones?

## 2022-12-06 NOTE — Patient Instructions (Signed)
Prostatitis  Prostatitis is swelling of the prostate gland, also called the prostate. This gland is about 1.5 inches wide and 1 inch high, and it helps to make a fluid called semen. The prostate is below a man's bladder, in front of the butt (rectum). There are different types of prostatitis. What are the causes? One type of prostatitis is caused by an infection from germs (bacteria). Another type is not caused by germs. It may be caused by: Things having to do with the nervous system. This system includes thebrain, spinal cord, and nerves. An autoimmune response. This happens when the body's disease-fighting system attacks healthy tissue in the body by mistake. Psychological factors. These have to do with how the mind works. The causes of other types of prostatitis are normally not known. What are the signs or symptoms? Symptoms of this condition depend on the type of prostatitis you have. If your condition is caused by germs: You may feel pain or burning when you pee (urinate). You may pee often and all of a sudden. You may have problems starting to pee. You may have trouble emptying your bladder when you pee. You may have fever or chills. You may feel pain in your muscles, joints, low back, or lower belly. If you have other types of prostatitis: You may pee often or all of a sudden. You may have trouble starting to pee. You may have a weak flow when you pee. You may leak pee after using the bathroom. You may have other problems, such as: Abnormal fluid coming from the penis. Pain in the testicles or penis. Pain between the butt and the testicles. Pain when fluid comes out of the penis during sex. How is this treated? Treatment for this condition depends on the type of prostatitis. Treatment may include: Medicines. These may treat pain or swelling, or they may help relax muscles. Exercises to help you move better or get stronger (physical therapy). Heat therapy. Techniques to help  you control some of the ways that your body works. Exercises to help you relax. Antibiotic medicine, if your condition is caused by germs. Warm water baths (sitz baths) to relax muscles. Follow these instructions at home: Medicines Take over-the-counter and prescription medicines only as told by your doctor. If you were prescribed an antibiotic medicine, take it as told by your doctor. Do not stop using the antibiotic even if you start to feel better. Managing pain and swelling  Take sitz baths as told by your doctor. For a sitz bath, sit in warm water that is deep enough to cover your hips and butt. If told, put heat on the painful area. Do this as often as told by your doctor. Use the heat source that your doctor recommends, such as a moist heat pack or a heating pad. Place a towel between your skin and the heat source. Leave the heat on for 20-30 minutes. Take off the heat if your skin turns bright red. This is very important if you are unable to feel pain, heat, or cold. You may have a greater risk of getting burned. General instructions Do exercises as told by your doctor, if your doctor prescribed them. Keep all follow-up visits as told by your doctor. This is important. Where to find more information [employer] of Diabetes and Digestive and Kidney Diseases: http://www.bass.com/ Contact a doctor if: Your symptoms get worse. You have a fever. Get help right away if: You have chills. You feel light-headed. You feel like you may  faint. You cannot pee. You have blood or clumps of blood (blood clots) in your pee. Summary Prostatitis is swelling of the prostate gland. There are different types of prostatitis. Treatment depends on the type that you have. Take over-the-counter and prescription medicines only as told by your doctor. Get help right away of you have chills, feel light-headed, or feel like you may faint. Also get help right away if you cannot pee or you have  blood or clumps of blood in your pee. This information is not intended to replace advice given to you by your health care provider. Make sure you discuss any questions you have with your health care provider. Document Revised: 11/13/2019 Document Reviewed: 11/13/2019 Elsevier Patient Education  [place].

## 2022-12-07 DIAGNOSIS — E7849 Other hyperlipidemia: Secondary | ICD-10-CM | POA: Diagnosis not present

## 2022-12-07 DIAGNOSIS — N419 Inflammatory disease of prostate, unspecified: Secondary | ICD-10-CM | POA: Diagnosis not present

## 2022-12-07 DIAGNOSIS — R030 Elevated blood-pressure reading, without diagnosis of hypertension: Secondary | ICD-10-CM | POA: Diagnosis not present

## 2022-12-07 DIAGNOSIS — M2550 Pain in unspecified joint: Secondary | ICD-10-CM | POA: Diagnosis not present

## 2022-12-07 DIAGNOSIS — Z23 Encounter for immunization: Secondary | ICD-10-CM | POA: Diagnosis not present

## 2022-12-07 DIAGNOSIS — M4712 Other spondylosis with myelopathy, cervical region: Secondary | ICD-10-CM | POA: Diagnosis not present

## 2022-12-07 DIAGNOSIS — G629 Polyneuropathy, unspecified: Secondary | ICD-10-CM | POA: Diagnosis not present

## 2022-12-07 DIAGNOSIS — N400 Enlarged prostate without lower urinary tract symptoms: Secondary | ICD-10-CM | POA: Diagnosis not present

## 2022-12-07 DIAGNOSIS — F5104 Psychophysiologic insomnia: Secondary | ICD-10-CM | POA: Diagnosis not present

## 2022-12-07 DIAGNOSIS — E039 Hypothyroidism, unspecified: Secondary | ICD-10-CM | POA: Diagnosis not present

## 2022-12-07 DIAGNOSIS — L4050 Arthropathic psoriasis, unspecified: Secondary | ICD-10-CM | POA: Diagnosis not present

## 2022-12-07 DIAGNOSIS — G4733 Obstructive sleep apnea (adult) (pediatric): Secondary | ICD-10-CM | POA: Diagnosis not present

## 2022-12-13 ENCOUNTER — Ambulatory Visit: Payer: Medicare Other | Admitting: Urology
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
## 2023-01-01 DIAGNOSIS — M5416 Radiculopathy, lumbar region: Secondary | ICD-10-CM | POA: Diagnosis not present

## 2023-01-07 DIAGNOSIS — M5020 Other cervical disc displacement, unspecified cervical region: Secondary | ICD-10-CM | POA: Diagnosis not present

## 2023-01-07 DIAGNOSIS — D473 Essential (hemorrhagic) thrombocythemia: Secondary | ICD-10-CM | POA: Diagnosis not present

## 2023-01-08 ENCOUNTER — Ambulatory Visit: Payer: Medicare Other | Attending: Rheumatology | Admitting: Physician Assistant

## 2023-01-08 ENCOUNTER — Encounter: Payer: Self-pay | Admitting: Physician Assistant

## 2023-01-08 VITALS — BP 139/69 | HR 62 | Resp 12 | Ht 71.0 in | Wt 235.0 lb

## 2023-01-08 DIAGNOSIS — M19041 Primary osteoarthritis, right hand: Secondary | ICD-10-CM | POA: Diagnosis not present

## 2023-01-08 DIAGNOSIS — I7381 Erythromelalgia: Secondary | ICD-10-CM | POA: Diagnosis not present

## 2023-01-08 DIAGNOSIS — M5030 Other cervical disc degeneration, unspecified cervical region: Secondary | ICD-10-CM

## 2023-01-08 DIAGNOSIS — E559 Vitamin D deficiency, unspecified: Secondary | ICD-10-CM | POA: Diagnosis not present

## 2023-01-08 DIAGNOSIS — G589 Mononeuropathy, unspecified: Secondary | ICD-10-CM

## 2023-01-08 DIAGNOSIS — D0439 Carcinoma in situ of skin of other parts of face: Secondary | ICD-10-CM

## 2023-01-08 DIAGNOSIS — M1990 Unspecified osteoarthritis, unspecified site: Secondary | ICD-10-CM

## 2023-01-08 DIAGNOSIS — Z840 Family history of diseases of the skin and subcutaneous tissue: Secondary | ICD-10-CM | POA: Diagnosis not present

## 2023-01-08 DIAGNOSIS — M5136 Other intervertebral disc degeneration, lumbar region: Secondary | ICD-10-CM | POA: Diagnosis not present

## 2023-01-08 DIAGNOSIS — R21 Rash and other nonspecific skin eruption: Secondary | ICD-10-CM

## 2023-01-08 DIAGNOSIS — M19071 Primary osteoarthritis, right ankle and foot: Secondary | ICD-10-CM | POA: Diagnosis not present

## 2023-01-08 DIAGNOSIS — M19042 Primary osteoarthritis, left hand: Secondary | ICD-10-CM

## 2023-01-08 DIAGNOSIS — Z79899 Other long term (current) drug therapy: Secondary | ICD-10-CM

## 2023-01-08 DIAGNOSIS — M170 Bilateral primary osteoarthritis of knee: Secondary | ICD-10-CM

## 2023-01-08 DIAGNOSIS — Z8719 Personal history of other diseases of the digestive system: Secondary | ICD-10-CM

## 2023-01-08 DIAGNOSIS — Z8639 Personal history of other endocrine, nutritional and metabolic disease: Secondary | ICD-10-CM

## 2023-01-08 DIAGNOSIS — M19072 Primary osteoarthritis, left ankle and foot: Secondary | ICD-10-CM

## 2023-01-08 DIAGNOSIS — D473 Essential (hemorrhagic) thrombocythemia: Secondary | ICD-10-CM

## 2023-01-08 DIAGNOSIS — G629 Polyneuropathy, unspecified: Secondary | ICD-10-CM | POA: Diagnosis not present

## 2023-01-08 NOTE — Patient Instructions (Signed)
Hydroxychloroquine Tablets What is this medication? HYDROXYCHLOROQUINE (hye drox ee KLOR oh kwin) treats autoimmune conditions, such as rheumatoid arthritis and lupus. It works by slowing down an overactive immune system. It may also be used to prevent and treat malaria. It works by killing the parasite that causes malaria. It belongs to a group of medications called DMARDs. This medicine may be used for other purposes; ask your health care provider or pharmacist if you have questions. COMMON BRAND NAME(S): Plaquenil, Quineprox What should I tell my care team before I take this medication? They need to know if you have any of these conditions: Diabetes Eye disease, vision problems Frequently drink alcohol G6PD deficiency Heart disease Irregular heartbeat or rhythm Kidney disease Liver disease Porphyria Psoriasis An unusual or allergic reaction to hydroxychloroquine, other medications, foods, dyes, or preservatives Pregnant or trying to get pregnant Breastfeeding How should I use this medication? Take this medication by mouth with water. Take it as directed on the prescription label. Do not cut, crush, or chew this medication. Swallow the tablets whole. Take it with food. Do not take it more than directed. Take all of this medication unless your care team tells you to stop it early. Keep taking it even if you think you are better. Take products with antacids in them at a different time of day than this medication. Take this medication 4 hours before or 4 hours after antacids. Talk to your care team if you have questions. Talk to your care team about the use of this medication in children. While this medication may be prescribed for selected conditions, precautions do apply. Overdosage: If you think you have taken too much of this medicine contact a poison control center or emergency room at once. NOTE: This medicine is only for you. Do not share this medicine with others. What if I miss a  dose? If you miss a dose, take it as soon as you can. If it is almost time for your next dose, take only that dose. Do not take double or extra doses. What may interact with this medication? Do not take this medication with any of the following: Cisapride Dronedarone Pimozide Thioridazine This medication may also interact with the following: Ampicillin Antacids Cimetidine Cyclosporine Digoxin Kaolin Medications for diabetes, such as insulin, glipizide, glyburide Medications for seizures, such as carbamazepine, phenobarbital, phenytoin Mefloquine Methotrexate Other medications that cause heart rhythm changes Praziquantel This list may not describe all possible interactions. Give your health care provider a list of all the medicines, herbs, non-prescription drugs, or dietary supplements you use. Also tell them if you smoke, drink alcohol, or use illegal drugs. Some items may interact with your medicine. What should I watch for while using this medication? Visit your care team for regular checks on your progress. Tell your care team if your symptoms do not start to get better or if they get worse. You may need blood work done while you are taking this medication. If you take other medications that can affect heart rhythm, you may need more testing. Talk to your care team if you have questions. Your vision may be tested before and during use of this medication. Tell your care team right away if you have any change in your eyesight. This medication may cause serious skin reactions. They can happen weeks to months after starting the medication. Contact your care team right away if you notice fevers or flu-like symptoms with a rash. The rash may be red or purple and then turn   into blisters or peeling of the skin. Or, you might notice a red rash with swelling of the face, lips or lymph nodes in your neck or under your arms. If you or your family notice any changes in your behavior, such as new or  worsening depression, thoughts of harming yourself, anxiety, or other unusual or disturbing thoughts, or memory loss, call your care team right away. What side effects may I notice from receiving this medication? Side effects that you should report to your care team as soon as possible: Allergic reactions--skin rash, itching, hives, swelling of the face, lips, tongue, or throat Aplastic anemia--unusual weakness or fatigue, dizziness, headache, trouble breathing, increased bleeding or bruising Change in vision Heart rhythm changes--fast or irregular heartbeat, dizziness, feeling faint or lightheaded, chest pain, trouble breathing Infection--fever, chills, cough, or sore throat Low blood sugar (hypoglycemia)--tremors or shaking, anxiety, sweating, cold or clammy skin, confusion, dizziness, rapid heartbeat Muscle injury--unusual weakness or fatigue, muscle pain, dark yellow or brown urine, decrease in amount of urine Pain, tingling, or numbness in the hands or feet Rash, fever, and swollen lymph nodes Redness, blistering, peeling, or loosening of the skin, including inside the mouth Thoughts of suicide or self-harm, worsening mood, or feelings of depression Unusual bruising or bleeding Side effects that usually do not require medical attention (report to your care team if they continue or are bothersome): Diarrhea Headache Nausea Stomach pain Vomiting This list may not describe all possible side effects. Call your doctor for medical advice about side effects. You may report side effects to FDA at 1-800-FDA-1088. Where should I keep my medication? Keep out of the reach of children and pets. Store at room temperature up to 30 degrees C (86 degrees F). Protect from light. Get rid of any unused medication after the expiration date. To get rid of medications that are no longer needed or have expired: Take the medication to a medication take-back program. Check with your pharmacy or law enforcement  to find a location. If you cannot return the medication, check the label or package insert to see if the medication should be thrown out in the garbage or flushed down the toilet. If you are not sure, ask your care team. If it is safe to put it in the trash, empty the medication out of the container. Mix the medication with cat litter, dirt, coffee grounds, or other unwanted substance. Seal the mixture in a bag or container. Put it in the trash. NOTE: This sheet is a summary. It may not cover all possible information. If you have questions about this medicine, talk to your doctor, pharmacist, or health care provider.  2023 Elsevier/Gold Standard (2007-11-29 00:00:00)  

## 2023-01-14 ENCOUNTER — Encounter: Payer: Self-pay | Admitting: Urology

## 2023-01-14 ENCOUNTER — Ambulatory Visit (INDEPENDENT_AMBULATORY_CARE_PROVIDER_SITE_OTHER): Payer: Medicare Other | Admitting: Urology

## 2023-01-14 VITALS — BP 134/62 | HR 39 | Ht 71.0 in | Wt 235.0 lb

## 2023-01-14 DIAGNOSIS — N138 Other obstructive and reflux uropathy: Secondary | ICD-10-CM

## 2023-01-14 DIAGNOSIS — N401 Enlarged prostate with lower urinary tract symptoms: Secondary | ICD-10-CM | POA: Diagnosis not present

## 2023-01-14 DIAGNOSIS — N411 Chronic prostatitis: Secondary | ICD-10-CM | POA: Diagnosis not present

## 2023-01-14 DIAGNOSIS — R3912 Poor urinary stream: Secondary | ICD-10-CM | POA: Diagnosis not present

## 2023-01-14 DIAGNOSIS — R5382 Chronic fatigue, unspecified: Secondary | ICD-10-CM | POA: Diagnosis not present

## 2023-01-14 DIAGNOSIS — D473 Essential (hemorrhagic) thrombocythemia: Secondary | ICD-10-CM | POA: Diagnosis not present

## 2023-01-14 LAB — MICROSCOPIC EXAMINATION: Bacteria, UA: NONE SEEN

## 2023-01-14 LAB — URINALYSIS, ROUTINE W REFLEX MICROSCOPIC
Bilirubin, UA: NEGATIVE
Glucose, UA: NEGATIVE
Ketones, UA: NEGATIVE
Leukocytes,UA: NEGATIVE
Nitrite, UA: NEGATIVE
RBC, UA: NEGATIVE
Specific Gravity, UA: 1.015 (ref 1.005–1.030)
Urobilinogen, Ur: 1 mg/dL (ref 0.2–1.0)
pH, UA: 5.5 (ref 5.0–7.5)

## 2023-01-14 MED ORDER — SILODOSIN 8 MG PO CAPS: 8.0000 mg | ORAL_CAPSULE | Freq: Every day | ORAL | 11 refills | Status: DC

## 2023-01-14 NOTE — Patient Instructions (Signed)

## 2023-01-14 NOTE — Progress Notes (Unsigned)
01/14/2023 10:34 AM   Alfred Jones 1951/04/10 [id number]  Referring provider: Curlene Labrum, MD [place],  [place] [address]  Followup BPH and prostatitis   HPI: Alfred Jones is a [id number] here for followup for BPH and chronic prostatitis. IPSS 15 QOL 4. Urine stream weak in the morning. He is on uroxatral 10mg  qhs. He has nocturia 2x. He has urinary frequency every 3-4 hours. No straining to urinate. Dysuria resolved after doxycyline therapy   PMH: Past Medical History:  Diagnosis Date   Acid reflux    Anxiety    Arthritis    RHEUMATOID OR PSORIATIC ?   BCC (basal cell carcinoma of skin) 06/19/2007   left lower chest (CX35FU)   BCC (basal cell carcinoma of skin) infiltrated 03/11/2018   right nose (MOHS)   BCC (basal cell carcinoma of skin) ulcerated 07/11/2010   left cheek    Cancer ([place])    Depression    Enlarged prostate    Hyperlipidemia    Numbness    Sleep apnea    TESTED BACK IN 2017   Superficial basal cell carcinoma (BCC) 03/11/2018   left shoulder     Surgical History: Past Surgical History:  Procedure Laterality Date   ANTERIOR CERVICAL DECOMP/DISCECTOMY FUSION N/A 09/23/2018   Procedure: Cervical Three-Four Cervical Four-Five Cervical Five-Six Anterior cervical decompression/discectomy/fusion;  Surgeon: Erline Levine, MD;  Location: [facility];  Service: Neurosurgery;  Laterality: N/A;  Cervical Three-Four Cervical Four-Five Cervical Five-Six Anterior cervical decompression/discectomy/fusion   CARDIAC CATHETERIZATION     20 YRS AGO AT [facility]   CATARACT EXTRACTION Bilateral 2023   COLONOSCOPY WITH PROPOFOL N/A 06/23/2020   One small polyp at hepatic flexure that was sessile and ablated without specimen for pathologist. Surveillance 10 years if overall health permits.   ESOPHAGOGASTRODUODENOSCOPY (EGD) WITH PROPOFOL N/A 08/13/2019   prominent Schatzki ring s/p dilation, overlying erosive reflux esophagitis, small hiatal hernia   EYE SURGERY     METAL  REMOVED FROM LEFT EYE  (IN OFFICE)   EYE SURGERY Left 10/2021   retinal tear   MALONEY DILATION N/A 08/13/2019   Procedure: MALONEY DILATION;  Surgeon: Daneil Dolin, MD;  [facility];  Service: Endoscopy;  Laterality: N/A;   MOLE REMOVAL     POLYPECTOMY  06/23/2020   Procedure: POLYPECTOMY;  Surgeon: Daneil Dolin, MD;  [facility];  Service: Endoscopy;;   TONSILLECTOMY     as a child    Home Medications:  Allergies as of 01/14/2023   No Known Allergies      Medication List        Accurate as of January 14, 2023 10:34 AM. If you have any questions, ask your nurse or doctor.          acetaminophen 500 MG tablet Commonly known as: TYLENOL Take 1,000 mg by mouth 2 (two) times daily.   alfuzosin 10 MG 24 hr tablet Commonly known as: UROXATRAL Take 1 tablet (10 mg total) by mouth daily with breakfast.   aspirin 325 MG tablet Take 325 mg by mouth daily.   celecoxib 200 MG capsule Commonly known as: CELEBREX Take 200 mg by mouth 2 (two) times daily.   cetirizine 10 MG tablet Commonly known as: ZYRTEC Take 2 tablets by mouth daily.   CINNAMON PO Take 1,200 mg by mouth daily.   clotrimazole 1 % cream Commonly known as: LOTRIMIN Apply 1 application topically at bedtime. Feet   Co-Enzyme Q-10 100  MG Caps Take 100 mg by mouth daily.   diclofenac Sodium 1 % Gel Commonly known as: VOLTAREN Apply 2 g topically at bedtime. Joint pain   docusate sodium 100 MG capsule Commonly known as: COLACE Take 200 mg by mouth 2 (two) times daily.   doxycycline 100 MG capsule Commonly known as: VIBRAMYCIN Take 1 capsule (100 mg total) by mouth 2 (two) times daily.   DULoxetine 60 MG capsule Commonly known as: CYMBALTA Take 60 mg by mouth daily.   fexofenadine 180 MG tablet Commonly known as: ALLEGRA Take 180 mg by mouth daily.   FIBER PO Take by mouth daily. Natural fibers found in fruits and vegetables.   Flax Seed Oil 1000 MG Caps Take by  mouth.   folic acid 1 MG tablet Commonly known as: FOLVITE Take 1 mg by mouth daily.   hydroxyurea 500 MG capsule Commonly known as: HYDREA Take 500 mg by mouth as directed. 500 mg on Monday and Thursday   ketoconazole 2 % cream Commonly known as: NIZORAL Apply 1 application. topically daily.   levothyroxine 50 MCG tablet Commonly known as: SYNTHROID Take 50 mcg by mouth daily.   linaclotide 290 MCG Caps capsule Commonly known as: LINZESS Take 290 mcg by mouth daily before breakfast.   Melatonin 10 MG Caps Take 10 mg by mouth at bedtime.   methocarbamol 500 MG tablet Commonly known as: ROBAXIN Take 500 mg by mouth daily.   MULTIVITAMIN MEN 50+ PO Take by mouth daily.   MEGAVITE FRUITS & VEGGIES PO Take by mouth.   OVER THE COUNTER MEDICATION Take 1,000 mg by mouth daily. Beet root   Oxycodone HCl 10 MG Tabs Take by mouth.   pantoprazole 40 MG tablet Commonly known as: PROTONIX Take 1 tablet by mouth 2 (two) times daily.   polyethylene glycol 17 g packet Commonly known as: MIRALAX / GLYCOLAX Take 17 g by mouth daily as needed.   pregabalin 50 MG capsule Commonly known as: LYRICA Take by mouth 2 (two) times daily.   REFRESH OP Apply to eye 2 (two) times daily.   rosuvastatin 5 MG tablet Commonly known as: CRESTOR Take 5 mg by mouth 2 (two) times a week.   tadalafil 5 MG tablet Commonly known as: CIALIS TAKE ONE TABLET BY MOUTH EVERY DAY   Tart Cherry 1200 MG Caps Take by mouth.   tiZANidine 4 MG tablet Commonly known as: ZANAFLEX Take by mouth.   traZODone 50 MG tablet Commonly known as: DESYREL Take 25 mg by mouth at bedtime.   TURMERIC PO Take by mouth at bedtime.   Valium 5 MG tablet Generic drug: diazepam take one tablet po 30 min prior to injection. May repeat x1 if needed   VITAMIN B-12 PO Take by mouth daily.   Vitamin D3 250 MCG (10000 UT) Caps Generic drug: Cholecalciferol Take 10,000 Units by mouth daily.         Allergies: No Known Allergies  Family History: Family History  Problem Relation Age of Onset   Heart disease Mother    Diabetes Mother    Heart attack Mother    Heart disease Father    Diabetes Father    Diabetes Sister    Seizures Sister    Diabetes Brother    Heart attack Brother    Sleep apnea Brother    Heart attack Brother    Sleep apnea Brother    Heart attack Brother    Diabetes Brother    Heart attack  Brother    Hypothyroidism Son    Hypothyroidism Daughter    Colon cancer Neg Hx     Social History:  reports that he quit smoking about 36 years ago. His smoking use included cigarettes. He has a 16.00 pack-year smoking history. He has been exposed to tobacco smoke. He has never used smokeless tobacco. He reports that he does not drink alcohol and does not use drugs.  ROS: All other review of systems were reviewed and are negative except what is noted above in HPI  Physical Exam: BP 134/62   Pulse (!) 39   Ht 5\' 11"  (1.803 m)   Wt 235 lb (106.6 kg)   BMI 32.78 kg/m   Constitutional:  Alert and oriented, No acute distress. HEENT:  AT, moist mucus membranes.  Trachea midline, no masses. Cardiovascular: No clubbing, cyanosis, or edema. Respiratory: Normal respiratory effort, no increased work of breathing. GI: Abdomen is soft, nontender, nondistended, no abdominal masses GU: No CVA tenderness.  Lymph: No cervical or inguinal lymphadenopathy. Skin: No rashes, bruises or suspicious lesions. Neurologic: Grossly intact, no focal deficits, moving all 4 extremities. Psychiatric: Normal mood and affect.  Laboratory Data: Lab Results  Component Value Date   WBC 9.6 09/04/2022   HGB 12.4 (L) 09/04/2022   HCT 36.6 (L) 09/04/2022   MCV 109.3 (H) 09/04/2022   PLT 473 (H) 09/04/2022    Lab Results  Component Value Date   CREATININE 1.14 09/04/2022    Lab Results  Component Value Date   PSA 1.12 12/02/2015    No results found for: "TESTOSTERONE"  Lab  Results  Component Value Date   HGBA1C 5.2 04/15/2018    Urinalysis    Component Value Date/Time   APPEARANCEUR Clear 12/05/2022 1536   GLUCOSEU Negative 12/05/2022 1536   BILIRUBINUR Negative 12/05/2022 1536   PROTEINUR Negative 12/05/2022 1536   UROBILINOGEN 1.0 01/15/2020 1121   NITRITE Negative 12/05/2022 1536   LEUKOCYTESUR Negative 12/05/2022 1536    Lab Results  Component Value Date   LABMICR Comment 12/05/2022   WBCUA None seen 07/29/2020   LABEPIT None seen 07/29/2020   MUCUS Present 07/29/2020   BACTERIA None seen 07/29/2020    Pertinent Imaging:  Results for orders placed during the hospital encounter of 07/11/20  DG Abd 1 View  Narrative CLINICAL DATA:  Constipation  EXAM: ABDOMEN - 1 VIEW  COMPARISON:  X-ray abdomen 04/18/2018, CT abdomen pelvis 06/03/2012  FINDINGS: The bowel gas pattern is normal. Stool within the ascending and proximal transverse colon. No radio-opaque calculi or other significant radiographic abnormality are seen. Visualized osseous structures are grossly unremarkable.  IMPRESSION: Nonobstructive bowel gas pattern with stool within the ascending colon proximal transverse colon.   Electronically Signed By: Iven Finn M.D. On: 07/11/2020 23:52  No results found for this or any previous visit.  No results found for this or any previous visit.  No results found for this or any previous visit.  No results found for this or any previous visit.  No valid procedures specified. No results found for this or any previous visit.  No results found for this or any previous visit.   Assessment & Plan:    1. Benign prostatic hyperplasia with urinary obstruction -we will trial rapaflo 8mg  qhs - Urinalysis, Routine w reflex microscopic  2. Weak Urinary stream -rapaflo 8mg  qhs  3. Chronic prostatitis -improved after antibiotic therapy  No follow-ups on file.  Nicolette Bang, MD  [facility] [place]

## 2023-01-15 ENCOUNTER — Encounter: Payer: Self-pay | Admitting: Urology

## 2023-01-15 DIAGNOSIS — L4050 Arthropathic psoriasis, unspecified: Secondary | ICD-10-CM | POA: Diagnosis not present

## 2023-01-15 DIAGNOSIS — M2550 Pain in unspecified joint: Secondary | ICD-10-CM | POA: Diagnosis not present

## 2023-01-15 DIAGNOSIS — Z6833 Body mass index (BMI) 33.0-33.9, adult: Secondary | ICD-10-CM | POA: Diagnosis not present

## 2023-01-15 DIAGNOSIS — R509 Fever, unspecified: Secondary | ICD-10-CM | POA: Diagnosis not present

## 2023-01-15 DIAGNOSIS — D649 Anemia, unspecified: Secondary | ICD-10-CM | POA: Diagnosis not present

## 2023-01-15 DIAGNOSIS — D473 Essential (hemorrhagic) thrombocythemia: Secondary | ICD-10-CM | POA: Diagnosis not present

## 2023-01-15 DIAGNOSIS — R030 Elevated blood-pressure reading, without diagnosis of hypertension: Secondary | ICD-10-CM | POA: Diagnosis not present

## 2023-01-15 DIAGNOSIS — R110 Nausea: Secondary | ICD-10-CM | POA: Diagnosis not present

## 2023-01-16 LAB — LAB REPORT - SCANNED: EGFR: 61

## 2023-01-21 DIAGNOSIS — E875 Hyperkalemia: Secondary | ICD-10-CM | POA: Diagnosis not present

## 2023-01-22 DIAGNOSIS — E875 Hyperkalemia: Secondary | ICD-10-CM | POA: Diagnosis not present

## 2023-01-28 ENCOUNTER — Telehealth: Payer: Self-pay | Admitting: *Deleted

## 2023-01-28 NOTE — Telephone Encounter (Signed)
Labs received from:Alyssa Tyron Russell, PA-C  Drawn on:01/15/2023  Reviewed by:Sherron Ales, PA-C  Labs drawn:RA Panel, CMP, CBC, TSH, Lipase, Vitamin B12 and Folate, Iron TIBC  Results:RBC 3.58    Hgb 12.5   Hct 36.7   MCV 103   MCH 34.9   Platelets 742   Glucose 109   Potassium 5.4  Per Taylor, PA-C, CBC stable, Potassium elevated 5.4. Consider recheck with PCP.

## 2023-02-11 ENCOUNTER — Ambulatory Visit: Payer: Medicare Other | Admitting: Dermatology

## 2023-02-11 DIAGNOSIS — R5383 Other fatigue: Secondary | ICD-10-CM | POA: Diagnosis not present

## 2023-02-11 DIAGNOSIS — D473 Essential (hemorrhagic) thrombocythemia: Secondary | ICD-10-CM | POA: Diagnosis not present

## 2023-02-12 ENCOUNTER — Ambulatory Visit: Payer: Medicare Other | Admitting: Dermatology

## 2023-02-18 DIAGNOSIS — D473 Essential (hemorrhagic) thrombocythemia: Secondary | ICD-10-CM | POA: Diagnosis not present

## 2023-02-18 DIAGNOSIS — R5383 Other fatigue: Secondary | ICD-10-CM | POA: Diagnosis not present

## 2023-02-25 DIAGNOSIS — Z85828 Personal history of other malignant neoplasm of skin: Secondary | ICD-10-CM | POA: Diagnosis not present

## 2023-02-25 DIAGNOSIS — X32XXXA Exposure to sunlight, initial encounter: Secondary | ICD-10-CM | POA: Diagnosis not present

## 2023-02-25 DIAGNOSIS — D225 Melanocytic nevi of trunk: Secondary | ICD-10-CM | POA: Diagnosis not present

## 2023-02-25 DIAGNOSIS — L570 Actinic keratosis: Secondary | ICD-10-CM | POA: Diagnosis not present

## 2023-02-25 DIAGNOSIS — L820 Inflamed seborrheic keratosis: Secondary | ICD-10-CM | POA: Diagnosis not present

## 2023-02-25 DIAGNOSIS — Z08 Encounter for follow-up examination after completed treatment for malignant neoplasm: Secondary | ICD-10-CM | POA: Diagnosis not present

## 2023-02-26 ENCOUNTER — Telehealth: Payer: Self-pay

## 2023-02-26 DIAGNOSIS — E875 Hyperkalemia: Secondary | ICD-10-CM | POA: Diagnosis not present

## 2023-02-26 DIAGNOSIS — N400 Enlarged prostate without lower urinary tract symptoms: Secondary | ICD-10-CM | POA: Diagnosis not present

## 2023-02-26 DIAGNOSIS — E039 Hypothyroidism, unspecified: Secondary | ICD-10-CM | POA: Diagnosis not present

## 2023-02-26 NOTE — Telephone Encounter (Signed)
Patients wife called and left a vm advising patient is having dysuria along with right and left flank pain. She wanted to know what was recommended. She requested an appointment with Dr. Ronne Binning but his next available is not until the end of June. She requested a clinical member contact her.   Thank you

## 2023-02-27 ENCOUNTER — Other Ambulatory Visit: Payer: Self-pay

## 2023-02-27 ENCOUNTER — Ambulatory Visit (INDEPENDENT_AMBULATORY_CARE_PROVIDER_SITE_OTHER): Payer: Medicare Other

## 2023-02-27 DIAGNOSIS — R300 Dysuria: Secondary | ICD-10-CM

## 2023-02-27 LAB — URINALYSIS, ROUTINE W REFLEX MICROSCOPIC
Bilirubin, UA: NEGATIVE
Glucose, UA: NEGATIVE
Ketones, UA: NEGATIVE
Leukocytes,UA: NEGATIVE
Nitrite, UA: NEGATIVE
RBC, UA: NEGATIVE
Specific Gravity, UA: 1.03 (ref 1.005–1.030)
Urobilinogen, Ur: 1 mg/dL (ref 0.2–1.0)
pH, UA: 5 (ref 5.0–7.5)

## 2023-02-27 NOTE — Telephone Encounter (Signed)
Patient will drop off urine for ua/uc due to complaints of dysuria-  pt aware we will reach out once MD reviews results.

## 2023-02-27 NOTE — Telephone Encounter (Signed)
Wife is aware that Dr. Ronne Binning review urine analysis and recommendation that no treatment will be started at this time. Wife is aware that the patient's urine will be sent off for culture. Wife voiced that if the culture comes back negative could the patient be experience symptoms prostatitis. Wife is aware that a message will be sent to Dr. Ronne Binning and someone will reach out with MD recommendation. Wife voiced understanding

## 2023-02-27 NOTE — Progress Notes (Cosign Needed Addendum)
Patient presents today with complaints of  Dysuria.  UA and Culture done today.  Dr. Ronne Binning  reviewed results and no treatment started.  Patient aware of MD recommendations and that we will reach out with culture results.      ZOXWRUEA, CMA

## 2023-03-01 LAB — URINE CULTURE

## 2023-03-04 ENCOUNTER — Other Ambulatory Visit: Payer: Self-pay | Admitting: Rheumatology

## 2023-03-04 DIAGNOSIS — M069 Rheumatoid arthritis, unspecified: Secondary | ICD-10-CM | POA: Diagnosis not present

## 2023-03-04 DIAGNOSIS — Z961 Presence of intraocular lens: Secondary | ICD-10-CM | POA: Diagnosis not present

## 2023-03-04 DIAGNOSIS — H26493 Other secondary cataract, bilateral: Secondary | ICD-10-CM | POA: Diagnosis not present

## 2023-03-04 DIAGNOSIS — H02831 Dermatochalasis of right upper eyelid: Secondary | ICD-10-CM | POA: Diagnosis not present

## 2023-03-04 DIAGNOSIS — H02834 Dermatochalasis of left upper eyelid: Secondary | ICD-10-CM | POA: Diagnosis not present

## 2023-03-04 DIAGNOSIS — L4052 Psoriatic arthritis mutilans: Secondary | ICD-10-CM | POA: Diagnosis not present

## 2023-03-04 DIAGNOSIS — Z79899 Other long term (current) drug therapy: Secondary | ICD-10-CM | POA: Diagnosis not present

## 2023-03-04 DIAGNOSIS — H33302 Unspecified retinal break, left eye: Secondary | ICD-10-CM | POA: Diagnosis not present

## 2023-03-04 DIAGNOSIS — H04123 Dry eye syndrome of bilateral lacrimal glands: Secondary | ICD-10-CM | POA: Diagnosis not present

## 2023-03-04 MED ORDER — DOXYCYCLINE HYCLATE 100 MG PO CAPS: 100.0000 mg | ORAL_CAPSULE | Freq: Two times a day (BID) | ORAL | 0 refills | Status: DC

## 2023-03-04 NOTE — Telephone Encounter (Unsigned)
Patient's wife stopped in the office stating Damonta had his Plaquenil eye exam at [facility] today.  They will be faxing the results.  Patient would like the plaquenil prescription to be sent to [facility] in [place].

## 2023-03-04 NOTE — Telephone Encounter (Signed)
Wife called and made aware of patient's negative urine culture and doxycycline sent in to pharmacy per Dr. Ronne Binning for prostatitis.

## 2023-03-05 DIAGNOSIS — G629 Polyneuropathy, unspecified: Secondary | ICD-10-CM | POA: Diagnosis not present

## 2023-03-05 DIAGNOSIS — Z6833 Body mass index (BMI) 33.0-33.9, adult: Secondary | ICD-10-CM | POA: Diagnosis not present

## 2023-03-05 DIAGNOSIS — M4712 Other spondylosis with myelopathy, cervical region: Secondary | ICD-10-CM | POA: Diagnosis not present

## 2023-03-05 DIAGNOSIS — R030 Elevated blood-pressure reading, without diagnosis of hypertension: Secondary | ICD-10-CM | POA: Diagnosis not present

## 2023-03-05 DIAGNOSIS — D473 Essential (hemorrhagic) thrombocythemia: Secondary | ICD-10-CM | POA: Diagnosis not present

## 2023-03-05 DIAGNOSIS — M2550 Pain in unspecified joint: Secondary | ICD-10-CM | POA: Diagnosis not present

## 2023-03-05 DIAGNOSIS — E039 Hypothyroidism, unspecified: Secondary | ICD-10-CM | POA: Diagnosis not present

## 2023-03-05 DIAGNOSIS — L4050 Arthropathic psoriasis, unspecified: Secondary | ICD-10-CM | POA: Diagnosis not present

## 2023-03-05 DIAGNOSIS — N400 Enlarged prostate without lower urinary tract symptoms: Secondary | ICD-10-CM | POA: Diagnosis not present

## 2023-03-05 DIAGNOSIS — N419 Inflammatory disease of prostate, unspecified: Secondary | ICD-10-CM | POA: Diagnosis not present

## 2023-03-05 DIAGNOSIS — E7849 Other hyperlipidemia: Secondary | ICD-10-CM | POA: Diagnosis not present

## 2023-03-05 DIAGNOSIS — E875 Hyperkalemia: Secondary | ICD-10-CM | POA: Diagnosis not present

## 2023-03-05 MED ORDER — HYDROXYCHLOROQUINE SULFATE 200 MG PO TABS: 200.0000 mg | ORAL_TABLET | Freq: Two times a day (BID) | ORAL | 0 refills | Status: DC

## 2023-03-05 NOTE — Telephone Encounter (Signed)
Last Fill: New start  Eye exam: 03/04/2023  WNL  Labs: 02/11/2023 RBC 3.60, HGB 12.4, MCV 101.9, MCH 34.4, Platelet 638, AST 14   Next Visit: 04/10/2023  Last Visit: 01/08/2023  [id number] arthritis  Current Dose per office note 01/08/2023:  Plaquenil 200 mg twice daily   Okay to refill Plaquenil?

## 2023-03-07 ENCOUNTER — Encounter: Payer: Self-pay | Admitting: Internal Medicine

## 2023-03-26 ENCOUNTER — Telehealth: Payer: Self-pay

## 2023-03-26 NOTE — Telephone Encounter (Signed)
Wife called stating patient is unable to tolerate the side effects from the doxycycline. She states patient is very sick on his stomach even though he eats a heavy meal before. Please advise on an alternative for patients prostatitis.

## 2023-03-26 NOTE — Telephone Encounter (Addendum)
Spoke with patient and he states that he cut back the doxycyline from bid to qd to see if he could tolerate the medication but he continued to feel sick to his stomach all day when he took it and it was affecting his work. Patient states his symptoms are still present and he is unsure if it is prostatitis. Patient advised to discontinue the doxycycline. Patient states he will call office back if his symptoms gets worse.
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
## 2023-04-04 DIAGNOSIS — M5416 Radiculopathy, lumbar region: Secondary | ICD-10-CM | POA: Diagnosis not present

## 2023-04-10 ENCOUNTER — Ambulatory Visit: Payer: Medicare Other | Attending: Physician Assistant | Admitting: Physician Assistant

## 2023-04-10 ENCOUNTER — Encounter: Payer: Self-pay | Admitting: Physician Assistant

## 2023-04-10 VITALS — BP 130/71 | HR 81 | Resp 16 | Ht 70.5 in | Wt 224.0 lb

## 2023-04-10 DIAGNOSIS — G4709 Other insomnia: Secondary | ICD-10-CM | POA: Diagnosis not present

## 2023-04-10 DIAGNOSIS — M170 Bilateral primary osteoarthritis of knee: Secondary | ICD-10-CM

## 2023-04-10 DIAGNOSIS — M19071 Primary osteoarthritis, right ankle and foot: Secondary | ICD-10-CM

## 2023-04-10 DIAGNOSIS — Z8719 Personal history of other diseases of the digestive system: Secondary | ICD-10-CM | POA: Diagnosis not present

## 2023-04-10 DIAGNOSIS — M5136 Other intervertebral disc degeneration, lumbar region: Secondary | ICD-10-CM

## 2023-04-10 DIAGNOSIS — M5030 Other cervical disc degeneration, unspecified cervical region: Secondary | ICD-10-CM | POA: Diagnosis not present

## 2023-04-10 DIAGNOSIS — M19042 Primary osteoarthritis, left hand: Secondary | ICD-10-CM | POA: Diagnosis not present

## 2023-04-10 DIAGNOSIS — Z79899 Other long term (current) drug therapy: Secondary | ICD-10-CM

## 2023-04-10 DIAGNOSIS — D473 Essential (hemorrhagic) thrombocythemia: Secondary | ICD-10-CM

## 2023-04-10 DIAGNOSIS — I7381 Erythromelalgia: Secondary | ICD-10-CM

## 2023-04-10 DIAGNOSIS — R21 Rash and other nonspecific skin eruption: Secondary | ICD-10-CM | POA: Diagnosis not present

## 2023-04-10 DIAGNOSIS — G629 Polyneuropathy, unspecified: Secondary | ICD-10-CM

## 2023-04-10 DIAGNOSIS — Z840 Family history of diseases of the skin and subcutaneous tissue: Secondary | ICD-10-CM | POA: Diagnosis not present

## 2023-04-10 DIAGNOSIS — D0439 Carcinoma in situ of skin of other parts of face: Secondary | ICD-10-CM

## 2023-04-10 DIAGNOSIS — E559 Vitamin D deficiency, unspecified: Secondary | ICD-10-CM | POA: Diagnosis not present

## 2023-04-10 DIAGNOSIS — Z8639 Personal history of other endocrine, nutritional and metabolic disease: Secondary | ICD-10-CM

## 2023-04-10 DIAGNOSIS — M1990 Unspecified osteoarthritis, unspecified site: Secondary | ICD-10-CM

## 2023-04-10 DIAGNOSIS — M19041 Primary osteoarthritis, right hand: Secondary | ICD-10-CM

## 2023-04-10 DIAGNOSIS — M19072 Primary osteoarthritis, left ankle and foot: Secondary | ICD-10-CM

## 2023-04-10 DIAGNOSIS — G589 Mononeuropathy, unspecified: Secondary | ICD-10-CM

## 2023-04-10 LAB — CBC WITH DIFFERENTIAL/PLATELET
Absolute Monocytes: 1371 cells/uL — ABNORMAL HIGH (ref 200–950)
Eosinophils Relative: 0.5 %
HCT: 36.6 % — ABNORMAL LOW (ref 38.5–50.0)
MCH: 34.8 pg — ABNORMAL HIGH (ref 27.0–33.0)
MCV: 100.3 fL — ABNORMAL HIGH (ref 80.0–100.0)
Neutrophils Relative %: 73.8 %

## 2023-04-11 DIAGNOSIS — D473 Essential (hemorrhagic) thrombocythemia: Secondary | ICD-10-CM | POA: Diagnosis not present

## 2023-04-11 DIAGNOSIS — R5383 Other fatigue: Secondary | ICD-10-CM | POA: Diagnosis not present

## 2023-04-11 LAB — CBC WITH DIFFERENTIAL/PLATELET
Basophils Absolute: 75 cells/uL (ref 0–200)
Basophils Relative: 0.5 %
Eosinophils Absolute: 75 cells/uL (ref 15–500)
Hemoglobin: 12.7 g/dL — ABNORMAL LOW (ref 13.2–17.1)
Lymphs Abs: 2384 cells/uL (ref 850–3900)
MCHC: 34.7 g/dL (ref 32.0–36.0)
MPV: 9.3 fL (ref 7.5–12.5)
Monocytes Relative: 9.2 %
Neutro Abs: 10996 cells/uL — ABNORMAL HIGH (ref 1500–7800)
Platelets: 695 10*3/uL — ABNORMAL HIGH (ref 140–400)
RBC: 3.65 10*6/uL — ABNORMAL LOW (ref 4.20–5.80)
RDW: 13.3 % (ref 11.0–15.0)
Total Lymphocyte: 16 %
WBC: 14.9 10*3/uL — ABNORMAL HIGH (ref 3.8–10.8)

## 2023-04-11 LAB — COMPLETE METABOLIC PANEL WITH GFR
AG Ratio: 2 (calc) (ref 1.0–2.5)
ALT: 14 U/L (ref 9–46)
AST: 12 U/L (ref 10–35)
Albumin: 4.3 g/dL (ref 3.6–5.1)
Alkaline phosphatase (APISO): 60 U/L (ref 35–144)
BUN/Creatinine Ratio: 21 (calc) (ref 6–22)
BUN: 26 mg/dL — ABNORMAL HIGH (ref 7–25)
CO2: 25 mmol/L (ref 20–32)
Calcium: 8.8 mg/dL (ref 8.6–10.3)
Chloride: 106 mmol/L (ref 98–110)
Creat: 1.25 mg/dL (ref 0.70–1.28)
Globulin: 2.2 g/dL (calc) (ref 1.9–3.7)
Glucose, Bld: 111 mg/dL — ABNORMAL HIGH (ref 65–99)
Potassium: 5.2 mmol/L (ref 3.5–5.3)
Sodium: 138 mmol/L (ref 135–146)
Total Bilirubin: 0.4 mg/dL (ref 0.2–1.2)
Total Protein: 6.5 g/dL (ref 6.1–8.1)
eGFR: 62 mL/min/{1.73_m2} (ref >=60)

## 2023-04-11 NOTE — Progress Notes (Signed)
WBC count is elevated. Absolute neutrophils are elevated-please clarify if he has had any recent infections?  Plt count remains elevated-please forward to hematologist.  CMP stable

## 2023-04-17 ENCOUNTER — Ambulatory Visit (INDEPENDENT_AMBULATORY_CARE_PROVIDER_SITE_OTHER): Payer: Medicare Other | Admitting: Urology

## 2023-04-17 VITALS — BP 101/60 | HR 81

## 2023-04-17 DIAGNOSIS — R3912 Poor urinary stream: Secondary | ICD-10-CM | POA: Diagnosis not present

## 2023-04-17 DIAGNOSIS — N401 Enlarged prostate with lower urinary tract symptoms: Secondary | ICD-10-CM

## 2023-04-17 DIAGNOSIS — N138 Other obstructive and reflux uropathy: Secondary | ICD-10-CM | POA: Diagnosis not present

## 2023-04-17 DIAGNOSIS — N411 Chronic prostatitis: Secondary | ICD-10-CM | POA: Diagnosis not present

## 2023-04-17 LAB — URINALYSIS, ROUTINE W REFLEX MICROSCOPIC
Bilirubin, UA: NEGATIVE
Glucose, UA: NEGATIVE
Ketones, UA: NEGATIVE
Leukocytes,UA: NEGATIVE
Nitrite, UA: NEGATIVE
Protein,UA: NEGATIVE
RBC, UA: NEGATIVE
Specific Gravity, UA: 1.03 (ref 1.005–1.030)
Urobilinogen, Ur: 1 mg/dL (ref 0.2–1.0)
pH, UA: 5.5 (ref 5.0–7.5)

## 2023-04-17 MED ORDER — SULFAMETHOXAZOLE-TRIMETHOPRIM 800-160 MG PO TABS: 1.0000 | ORAL_TABLET | Freq: Two times a day (BID) | ORAL | 0 refills | Status: DC

## 2023-04-17 NOTE — Progress Notes (Signed)
04/17/2023 11:17 AM   Alfred Jones 10-05-1951 [id number]  Referring provider: Juliette Alcide, MD [address] [place],  [place] [address]  Followup BPH and chronic prostatitis.   HPI: Mr Alfred Jones is a 72yo here for followup for chronic prostatitis and BPH.  Uroxatral caused dizziness. IPSS 18 QOL 3 on rapaflo 8mg . He is taking tadalafil 5mg  daily. His urine has become more concentrated. He is interested in proceeding with Urolift in 08/2023. His wife is scheduled for surgery 07/2023.   PMH: Past Medical History:  Diagnosis Date   Acid reflux    Anxiety    Arthritis    RHEUMATOID OR PSORIATIC ?   BCC (basal cell carcinoma of skin) 06/19/2007   left lower chest (CX35FU)   BCC (basal cell carcinoma of skin) infiltrated 03/11/2018   right nose (MOHS)   BCC (basal cell carcinoma of skin) ulcerated 07/11/2010   left cheek    Cancer (HCC)    Depression    Enlarged prostate    Hyperlipidemia    Numbness    Sleep apnea    TESTED BACK IN 2017   Superficial basal cell carcinoma (BCC) 03/11/2018   left shoulder     Surgical History: Past Surgical History:  Procedure Laterality Date   ANTERIOR CERVICAL DECOMP/DISCECTOMY FUSION N/A 09/23/2018   Procedure: Cervical Three-Four Cervical Four-Five Cervical Five-Six Anterior cervical decompression/discectomy/fusion;  Surgeon: Maeola Harman, MD;  Location: [facility] OR;  Service: Neurosurgery;  Laterality: N/A;  Cervical Three-Four Cervical Four-Five Cervical Five-Six Anterior cervical decompression/discectomy/fusion   CARDIAC CATHETERIZATION     20 YRS AGO AT [facility]   CATARACT EXTRACTION Bilateral 2023   COLONOSCOPY WITH PROPOFOL N/A 06/23/2020   One small polyp at hepatic flexure that was sessile and ablated without specimen for pathologist. Surveillance 10 years if overall health permits.   ESOPHAGOGASTRODUODENOSCOPY (EGD) WITH PROPOFOL N/A 08/13/2019   prominent Schatzki ring s/p dilation, overlying erosive reflux esophagitis, small hiatal  hernia   EYE SURGERY     METAL REMOVED FROM LEFT EYE  (IN OFFICE)   EYE SURGERY Left 10/2021   retinal tear   MALONEY DILATION N/A 08/13/2019   Procedure: MALONEY DILATION;  Surgeon: Corbin Ade, MD;  Location: [facility];  Service: Endoscopy;  Laterality: N/A;   MOLE REMOVAL     POLYPECTOMY  06/23/2020   Procedure: POLYPECTOMY;  Surgeon: Corbin Ade, MD;  Location: [facility];  Service: Endoscopy;;   TONSILLECTOMY     as a child    Home Medications:  Allergies as of 04/17/2023   No Known Allergies      Medication List        Accurate as of April 17, 2023 11:17 AM. If you have any questions, ask your nurse or doctor.          acetaminophen 500 MG tablet Commonly known as: TYLENOL Take 1,000 mg by mouth 2 (two) times daily.   alfuzosin 10 MG 24 hr tablet Commonly known as: UROXATRAL Take 1 tablet (10 mg total) by mouth daily with breakfast.   aspirin 325 MG tablet Take 325 mg by mouth daily.   aspirin EC 81 MG tablet Take 81 mg by mouth daily. Swallow whole.   celecoxib 200 MG capsule Commonly known as: CELEBREX Take 200 mg by mouth 2 (two) times daily.   cetirizine 10 MG tablet Commonly known as: ZYRTEC Take 2 tablets by mouth daily.   CINNAMON PO Take 1,200 mg by mouth daily.   clotrimazole  1 % cream Commonly known as: LOTRIMIN Apply 1 application topically at bedtime. Feet   Co-Enzyme Q-10 100 MG Caps Take 100 mg by mouth daily.   diclofenac Sodium 1 % Gel Commonly known as: VOLTAREN Apply 2 g topically at bedtime. Joint pain   docusate sodium 100 MG capsule Commonly known as: COLACE Take 200 mg by mouth 2 (two) times daily.   doxycycline 100 MG capsule Commonly known as: VIBRAMYCIN Take 1 capsule (100 mg total) by mouth 2 (two) times daily.   doxycycline 100 MG capsule Commonly known as: VIBRAMYCIN Take 1 capsule (100 mg total) by mouth 2 (two) times daily.   DULoxetine 60 MG capsule Commonly known as: CYMBALTA Take 60 mg  by mouth daily.   fexofenadine 180 MG tablet Commonly known as: ALLEGRA Take 180 mg by mouth daily.   FIBER PO Take by mouth daily. Natural fibers found in fruits and vegetables.   Flax Seed Oil 1000 MG Caps Take by mouth.   folic acid 1 MG tablet Commonly known as: FOLVITE Take 1 mg by mouth daily.   hydroxychloroquine 200 MG tablet Commonly known as: Plaquenil Take 1 tablet (200 mg total) by mouth 2 (two) times daily.   hydroxyurea 500 MG capsule Commonly known as: HYDREA Take 500 mg by mouth as directed. 500 mg on Monday and Thursday   ketoconazole 2 % cream Commonly known as: NIZORAL Apply 1 application. topically daily.   levothyroxine 50 MCG tablet Commonly known as: SYNTHROID Take 50 mcg by mouth daily.   linaclotide 290 MCG Caps capsule Commonly known as: LINZESS Take 290 mcg by mouth daily before breakfast.   Melatonin 10 MG Caps Take 10 mg by mouth at bedtime.   methocarbamol 500 MG tablet Commonly known as: ROBAXIN Take 500 mg by mouth daily.   MULTIVITAMIN MEN 50+ PO Take by mouth daily.   MEGAVITE FRUITS & VEGGIES PO Take by mouth.   OVER THE COUNTER MEDICATION Take 1,000 mg by mouth daily. Beet root   Oxycodone HCl 10 MG Tabs Take by mouth.   pantoprazole 40 MG tablet Commonly known as: PROTONIX Take 1 tablet by mouth 2 (two) times daily.   polyethylene glycol 17 g packet Commonly known as: MIRALAX / GLYCOLAX Take 17 g by mouth daily as needed.   pregabalin 50 MG capsule Commonly known as: LYRICA Take by mouth 2 (two) times daily.   REFRESH OP Apply to eye 2 (two) times daily.   rosuvastatin 5 MG tablet Commonly known as: CRESTOR Take 5 mg by mouth 2 (two) times a week.   silodosin 8 MG Caps capsule Commonly known as: RAPAFLO Take 1 capsule (8 mg total) by mouth at bedtime.   tadalafil 5 MG tablet Commonly known as: CIALIS TAKE ONE TABLET BY MOUTH EVERY DAY   Tart Cherry 1200 MG Caps Take by mouth.   tiZANidine 4 MG  tablet Commonly known as: ZANAFLEX Take by mouth.   traZODone 50 MG tablet Commonly known as: DESYREL Take 25 mg by mouth at bedtime.   TURMERIC PO Take by mouth at bedtime.   Valium 5 MG tablet Generic drug: diazepam take one tablet po 30 min prior to injection. May repeat x1 if needed   VITAMIN B-12 PO Take by mouth daily.   Vitamin D3 250 MCG (10000 UT) capsule Take 10,000 Units by mouth daily.        Allergies: No Known Allergies  Family History: Family History  Problem Relation Age of Onset  Heart disease Mother    Diabetes Mother    Heart attack Mother    Heart disease Father    Diabetes Father    Diabetes Sister    Seizures Sister    Diabetes Brother    Heart attack Brother    Sleep apnea Brother    Heart attack Brother    Sleep apnea Brother    Heart attack Brother    Diabetes Brother    Heart attack Brother    Hypothyroidism Daughter    Hypothyroidism Son    Colon cancer Neg Hx     Social History:  reports that he quit smoking about 36 years ago. His smoking use included cigarettes. He has a 16.00 pack-year smoking history. He has been exposed to tobacco smoke. He has never used smokeless tobacco. He reports that he does not drink alcohol and does not use drugs.  ROS: All other review of systems were reviewed and are negative except what is noted above in HPI  Physical Exam: BP 101/60   Pulse 81   Constitutional:  Alert and oriented, No acute distress. HEENT: [place] AT, moist mucus membranes.  Trachea midline, no masses. Cardiovascular: No clubbing, cyanosis, or edema. Respiratory: Normal respiratory effort, no increased work of breathing. GI: Abdomen is soft, nontender, nondistended, no abdominal masses GU: No CVA tenderness.  Lymph: No cervical or inguinal lymphadenopathy. Skin: No rashes, bruises or suspicious lesions. Neurologic: Grossly intact, no focal deficits, moving all 4 extremities. Psychiatric: Normal mood and affect.  Laboratory  Data: Lab Results  Component Value Date   WBC 14.9 (H) 04/10/2023   HGB 12.7 (L) 04/10/2023   HCT 36.6 (L) 04/10/2023   MCV 100.3 (H) 04/10/2023   PLT 695 (H) 04/10/2023    Lab Results  Component Value Date   CREATININE 1.25 04/10/2023    Lab Results  Component Value Date   PSA 1.12 12/02/2015    No results found for: "TESTOSTERONE"  Lab Results  Component Value Date   HGBA1C 5.2 04/15/2018    Urinalysis    Component Value Date/Time   APPEARANCEUR Clear 02/27/2023 1100   GLUCOSEU Negative 02/27/2023 1100   BILIRUBINUR Negative 02/27/2023 1100   PROTEINUR Trace 02/27/2023 1100   UROBILINOGEN 1.0 01/15/2020 1121   NITRITE Negative 02/27/2023 1100   LEUKOCYTESUR Negative 02/27/2023 1100    Lab Results  Component Value Date   LABMICR Comment 02/27/2023   WBCUA 0-5 01/14/2023   LABEPIT 0-10 01/14/2023   MUCUS Present 07/29/2020   BACTERIA None seen 01/14/2023    Pertinent Imaging:  Results for orders placed during the hospital encounter of 07/11/20  DG Abd 1 View  Narrative CLINICAL DATA:  Constipation  EXAM: ABDOMEN - 1 VIEW  COMPARISON:  X-ray abdomen 04/18/2018, CT abdomen pelvis 06/03/2012  FINDINGS: The bowel gas pattern is normal. Stool within the ascending and proximal transverse colon. No radio-opaque calculi or other significant radiographic abnormality are seen. Visualized osseous structures are grossly unremarkable.  IMPRESSION: Nonobstructive bowel gas pattern with stool within the ascending colon proximal transverse colon.   Electronically Signed By: Tish Frederickson M.D. On: 07/11/2020 23:52  No results found for this or any previous visit.  No results found for this or any previous visit.  No results found for this or any previous visit.  No results found for this or any previous visit.  No valid procedures specified. No results found for this or any previous visit.  No results found for this or any previous  visit.  Assessment & Plan:    1. Chronic prostatitis without hematuria -bactrdim DS BID for 28 days - Urinalysis, Routine w reflex microscopic  2. Benign prostatic hyperplasia with urinary obstruction -continue rapaflo 8mg  daily  3. Weak urinary stream Continue rapaflo 8mg  daily    No follow-ups on file.  Wilkie Aye, MD  [facility] [place]

## 2023-04-18 DIAGNOSIS — D473 Essential (hemorrhagic) thrombocythemia: Secondary | ICD-10-CM | POA: Diagnosis not present

## 2023-04-21 ENCOUNTER — Encounter: Payer: Self-pay | Admitting: Urology

## 2023-04-21 NOTE — Patient Instructions (Signed)

## 2023-04-24 ENCOUNTER — Other Ambulatory Visit: Payer: Self-pay | Admitting: Physician Assistant

## 2023-04-24 NOTE — Telephone Encounter (Signed)
Last Fill: 03/05/2023  Eye exam: 03/04/2023   Labs: 04/11/2023 WBC 14.7, RBC 3.49, HGB 12.2, MCV 106.3, MCH 35.0, Platelet 704, Absolute Neutrophils 10.4, Absolute Monocytes 1.3, Glucose 111, BUN 26,   Next Visit: 07/11/2023  Last Visit: 04/10/2023  UJ:[id number] arthritis   Current Dose per office note 04/10/2023: Plaquenil 200 mg 1 tablet by mouth twice daily.   Okay to refill Plaquenil?

## 2023-05-03 ENCOUNTER — Encounter: Payer: Self-pay | Admitting: Internal Medicine

## 2023-05-03 ENCOUNTER — Ambulatory Visit (INDEPENDENT_AMBULATORY_CARE_PROVIDER_SITE_OTHER): Payer: Medicare Other | Admitting: Internal Medicine

## 2023-05-03 VITALS — BP 102/63 | HR 69 | Temp 97.7°F | Ht 71.0 in | Wt 227.6 lb

## 2023-05-03 DIAGNOSIS — K5909 Other constipation: Secondary | ICD-10-CM

## 2023-05-03 DIAGNOSIS — K219 Gastro-esophageal reflux disease without esophagitis: Secondary | ICD-10-CM

## 2023-05-03 MED ORDER — LINACLOTIDE 290 MCG PO CAPS: 290.0000 ug | ORAL_CAPSULE | Freq: Every day | ORAL | 11 refills | Status: DC

## 2023-05-03 NOTE — Progress Notes (Unsigned)
Primary Care Physician:  Juliette Alcide, MD Primary Gastroenterologist:  Dr. Jena Gauss  Pre-Procedure History & Physical: HPI:  Alfred Jones is a 72 y.o. male here for follow-up GERD and constipation.  No dysphagia.  Twice daily PPI required to control symptoms.  Takes Linzess on an as-needed basis.  He has episodic constipation.  No rectal bleeding.  Due for screening colonoscopy 2031. Has a habit of "throwing back" all of his medications at 1 with water.  Past Medical History:  Diagnosis Date   Acid reflux    Anxiety    Arthritis    RHEUMATOID OR PSORIATIC ?   BCC (basal cell carcinoma of skin) 06/19/2007   left lower chest (CX35FU)   BCC (basal cell carcinoma of skin) infiltrated 03/11/2018   right nose (MOHS)   BCC (basal cell carcinoma of skin) ulcerated 07/11/2010   left cheek    Cancer (HCC)    Depression    Enlarged prostate    Hyperlipidemia    Numbness    Sleep apnea    TESTED BACK IN 2017   Superficial basal cell carcinoma (BCC) 03/11/2018   left shoulder     Past Surgical History:  Procedure Laterality Date   ANTERIOR CERVICAL DECOMP/DISCECTOMY FUSION N/A 09/23/2018   Procedure: Cervical Three-Four Cervical Four-Five Cervical Five-Six Anterior cervical decompression/discectomy/fusion;  Surgeon: Maeola Harman, MD;  Location: [facility] OR;  Service: Neurosurgery;  Laterality: N/A;  Cervical Three-Four Cervical Four-Five Cervical Five-Six Anterior cervical decompression/discectomy/fusion   CARDIAC CATHETERIZATION     20 YRS AGO AT [facility]   CATARACT EXTRACTION Bilateral 2023   COLONOSCOPY WITH PROPOFOL N/A 06/23/2020   One small polyp at hepatic flexure that was sessile and ablated without specimen for pathologist. Surveillance 10 years if overall health permits.   ESOPHAGOGASTRODUODENOSCOPY (EGD) WITH PROPOFOL N/A 08/13/2019   prominent Schatzki ring s/p dilation, overlying erosive reflux esophagitis, small hiatal hernia   EYE SURGERY     METAL REMOVED FROM LEFT EYE   (IN OFFICE)   EYE SURGERY Left 10/2021   retinal tear   MALONEY DILATION N/A 08/13/2019   Procedure: MALONEY DILATION;  Surgeon: Corbin Ade, MD;  [facility];  Service: Endoscopy;  Laterality: N/A;   MOLE REMOVAL     POLYPECTOMY  06/23/2020   Procedure: POLYPECTOMY;  Surgeon: Corbin Ade, MD;  Location: [facility];  Service: Endoscopy;;   TONSILLECTOMY     as a child    Prior to Admission medications   Medication Sig Start Date End Date Taking? Authorizing Provider  acetaminophen (TYLENOL) 500 MG tablet Take 1,000 mg by mouth 2 (two) times daily.   Yes [provider]  aspirin EC 81 MG tablet Take 81 mg by mouth daily. Swallow whole.   Yes [provider]  celecoxib (CELEBREX) 200 MG capsule Take 200 mg by mouth 2 (two) times daily. 07/21/21  Yes [provider]  cetirizine (ZYRTEC) 10 MG tablet Take 2 tablets by mouth daily.   Yes [provider]  Cholecalciferol (VITAMIN D3) 250 MCG (10000 UT) capsule Take 10,000 Units by mouth daily.   Yes [provider]  CINNAMON PO Take 1,200 mg by mouth daily.   Yes [provider]  clotrimazole (LOTRIMIN) 1 % cream Apply 1 application topically at bedtime. Feet   Yes [provider]  Co-Enzyme Q-10 100 MG CAPS Take 100 mg by mouth daily.   Yes [provider]  Cyanocobalamin (VITAMIN B-12 PO) Take by mouth  daily.   Yes [provider]  diclofenac Sodium (VOLTAREN) 1 % GEL Apply 2 g topically at bedtime. Joint pain   Yes [provider]  docusate sodium (COLACE) 100 MG capsule Take 200 mg by mouth 2 (two) times daily.   Yes [provider]  DULoxetine (CYMBALTA) 60 MG capsule Take 60 mg by mouth daily.   Yes [provider]  fexofenadine (ALLEGRA) 180 MG tablet Take 180 mg by mouth daily.   Yes [provider]  Flaxseed, Linseed, (FLAX SEED OIL) 1000 MG CAPS Take by mouth.   Yes [provider]  folic  acid (FOLVITE) 1 MG tablet Take 1 mg by mouth daily.   Yes [provider]  hydroxychloroquine (PLAQUENIL) 200 MG tablet TAKE ONE TABLET BY MOUTH TWICE DAILY 04/24/23  Yes Gearldine Bienenstock, PA-C  hydroxyurea (HYDREA) 500 MG capsule Take 500 mg by mouth as directed. 500 mg on Monday and Thursday   Yes [provider]  ketoconazole (NIZORAL) 2 % cream Apply 1 application. topically daily.   Yes [provider]  levothyroxine (SYNTHROID) 50 MCG tablet Take 50 mcg by mouth daily. 09/04/22  Yes [provider]  linaclotide (LINZESS) 290 MCG CAPS capsule Take 290 mcg by mouth daily before breakfast.   Yes [provider]  Melatonin 10 MG CAPS Take 10 mg by mouth at bedtime.    Yes [provider]  Multiple Vitamins-Minerals (MEGAVITE FRUITS & VEGGIES PO) Take by mouth.   Yes [provider]  Oxycodone HCl 10 MG TABS Take by mouth. 07/25/21  Yes [provider]  pantoprazole (PROTONIX) 40 MG tablet Take 1 tablet by mouth 2 (two) times daily. 01/11/21  Yes [provider]  polyethylene glycol (MIRALAX / GLYCOLAX) 17 g packet Take 17 g by mouth daily as needed.   Yes [provider]  Polyvinyl Alcohol-Povidone (REFRESH OP) Apply to eye 2 (two) times daily.   Yes [provider]  pregabalin (LYRICA) 50 MG capsule Take by mouth 2 (two) times daily. 01/24/22  Yes [provider]  rosuvastatin (CRESTOR) 5 MG tablet Take 5 mg by mouth 2 (two) times a week.   Yes [provider]  silodosin (RAPAFLO) 8 MG CAPS capsule Take 1 capsule (8 mg total) by mouth at bedtime. 01/14/23  Yes McKenzie, Mardene Celeste, MD  sulfamethoxazole-trimethoprim (BACTRIM DS) 800-160 MG tablet Take 1 tablet by mouth every 12 (twelve) hours. 04/17/23  Yes McKenzie, Mardene Celeste, MD  tadalafil (CIALIS) 5 MG tablet TAKE ONE TABLET BY MOUTH EVERY DAY 09/14/22  Yes Bjorn Pippin, MD  Tart Cherry 1200 MG CAPS Take by mouth.   Yes [provider]  tiZANidine (ZANAFLEX) 4 MG tablet Take by mouth. 11/22/22  Yes [provider]  traZODone (DESYREL) 50 MG tablet Take 25 mg by mouth at bedtime.   Yes [provider]  TURMERIC PO Take by mouth at bedtime.   Yes [provider]  VALIUM 5 MG tablet take one tablet po 30 min prior to injection. May repeat x1 if needed Patient not taking: Reported on 05/03/2023 11/06/22   [provider]    Allergies as of 05/03/2023   (No Known Allergies)    Family History  Problem Relation Age of Onset   Heart disease Mother    Diabetes Mother    Heart attack Mother    Heart disease Father    Diabetes Father    Diabetes Sister    Seizures Sister  Diabetes Brother    Heart attack Brother    Sleep apnea Brother    Heart attack Brother    Sleep apnea Brother    Heart attack Brother    Diabetes Brother    Heart attack Brother    Hypothyroidism Daughter    Hypothyroidism Son    Colon cancer Neg Hx     Social History   Socioeconomic History   Marital status: Married    Spouse name: Not on file   Number of children: 2   Years of education: 12   Highest education level: Not on file  Occupational History   Occupation: Curator  Tobacco Use   Smoking status: Former    Current packs/day: 0.00    Average packs/day: 1 pack/day for 16.0 years (16.0 ttl pk-yrs)    Types: Cigarettes    Start date: 10/22/1970    Quit date: 10/22/1986    Years since quitting: 36.5    Passive exposure: Past (minimal)   Smokeless tobacco: Never  Vaping Use   Vaping status: Never Used  Substance and Sexual Activity   Alcohol use: No   Drug use: No   Sexual activity: Yes  Other Topics Concern   Not on file  Social History Narrative   Lives at home with his wife and son.   Right-handed.   2-4 cups caffeine per day.   Social Determinants of Health   Financial Resource Strain: Low Risk  (08/02/2022)   Received from [facility], [facility]   Overall  Financial Resource Strain (CARDIA)    Difficulty of Paying Living Expenses: Not hard at all  Food Insecurity: No Food Insecurity (08/02/2022)   Received from [facility], [facility]   Hunger Vital Sign    Worried About Running Out of Food in the Last Year: Never true    Ran Out of Food in the Last Year: Never true  Transportation Needs: No Transportation Needs (08/02/2022)   Received from [facility], [facility]   [facility] - Transportation    Lack of Transportation (Medical): No    Lack of Transportation (Non-Medical): No  Physical Activity: Inactive (08/02/2022)   Received from [facility], [facility]   Exercise Vital Sign    Days of Exercise per Week: 0 days    Minutes of Exercise per Session: 0 min  Stress: No Stress Concern Present (08/02/2022)   Received from [facility], [facility] of Occupational Health - Occupational Stress Questionnaire    Feeling of Stress : Not at all  Social Connections: Moderately Integrated (08/02/2022)   Received from [facility]   Social Connection and Isolation Panel [NHANES]    Frequency of Communication with Friends and Family: More than three times a week    Frequency of Social Gatherings with Friends and Family: Three times a week    Attends Religious Services: More than 4 times per year    Active Member of Clubs or Organizations: No    Attends Banker Meetings: Never    Marital Status: Married  Catering manager Violence: Not At Risk (08/02/2022)   Received from [facility], [facility]   Humiliation, Afraid, Rape, and Kick questionnaire    Fear of Current or Ex-Partner: No    Emotionally Abused: No    Physically Abused: No    Sexually Abused: No    Review of Systems: See HPI, otherwise  negative ROS  Physical Exam: BP 102/63 (BP Location: Right Arm, Patient Position: Sitting, Cuff Size: Large)   Pulse 69   Temp 97.7 F (36.5 C) (Oral)    Ht 5\' 11"  (1.803 m)   Wt 227 lb 9.6 oz (103.2 kg)   SpO2 93%   BMI 31.74 kg/m  General:   Alert,  Well-developed, well-nourished, pleasant and cooperative in NAD Lungs:  Clear throughout to auscultation.   No wheezes, crackles, or rhonchi. No acute distress. Heart:  Regular rate and rhythm; no murmurs, clicks, rubs,  or gallops. Abdomen: Obese.  Positive bowel sounds soft and nontender without appreciable mass or organomegaly.    Impression/Plan:    GERD well-controlled on twice daily PPI therapy.  Constipation responsive to Linzess but only has been taking sporadically.  No alarm symptoms.   Recommendations:  Continue Protonix 40 mg twice daily 30 minutes before breakfast and supper  Continue Linzess 290 gelcaps.  Take 1 daily without fail.  New prescription dispense 30 with 11 refills  Plan for follow-up colonoscopy 2031 if overall health permits  Divide up medications (please do not try to swallow them all at once)  Office follow-up with me in November.      Notice: This dictation was prepared with Dragon dictation along with smaller phrase technology. Any transcriptional errors that result from this process are unintentional and may not be corrected upon review.

## 2023-05-03 NOTE — Patient Instructions (Signed)
It was good to see you again today!  Continue Protonix 40 mg twice daily 30 minutes before breakfast and supper  Continue Linzess 290 gelcaps.  Take 1 daily without fail.  New prescription dispense 30 with 11 refills  Plan for follow-up colonoscopy 2031 if overall health permits  Divide up your medications (please do not try to swallow them all at once)  Office follow-up with me in November.

## 2023-05-13 ENCOUNTER — Other Ambulatory Visit: Payer: Self-pay | Admitting: Physician Assistant

## 2023-05-16 DIAGNOSIS — M5020 Other cervical disc displacement, unspecified cervical region: Secondary | ICD-10-CM | POA: Diagnosis not present

## 2023-05-16 DIAGNOSIS — D473 Essential (hemorrhagic) thrombocythemia: Secondary | ICD-10-CM | POA: Diagnosis not present

## 2023-05-16 DIAGNOSIS — R5383 Other fatigue: Secondary | ICD-10-CM | POA: Diagnosis not present

## 2023-05-17 ENCOUNTER — Telehealth: Payer: Self-pay

## 2023-05-17 NOTE — Telephone Encounter (Signed)
Advised patient's wife [name] Please clarify if the patient is still following up at [facility]. Patient is still following up with [facility]. If so the refill will need to be sent from the initial prescriber. [name] verbalized understanding.

## 2023-05-17 NOTE — Telephone Encounter (Signed)
Patient's wife Alfred Jones contacted the office to request a refill of the patient's Pregabalin because the patient is almost out. Katha Hamming we were not the last person to fill this medication but that I would still send a message to his doctor. Alfred Jones said if the Pregabalin can be filled for the patient, they would like it sent to [facility] in [place]. Elizabeth's call back number is [phone]. Please advise.

## 2023-05-17 NOTE — Telephone Encounter (Signed)
Please clarify if the patient is still following up at [facility].  If so the refill will need to be sent from the initial prescriber.

## 2023-05-22 DIAGNOSIS — L4050 Arthropathic psoriasis, unspecified: Secondary | ICD-10-CM | POA: Diagnosis not present

## 2023-05-22 DIAGNOSIS — D473 Essential (hemorrhagic) thrombocythemia: Secondary | ICD-10-CM | POA: Diagnosis not present

## 2023-05-22 DIAGNOSIS — D649 Anemia, unspecified: Secondary | ICD-10-CM | POA: Diagnosis not present

## 2023-05-22 DIAGNOSIS — R5382 Chronic fatigue, unspecified: Secondary | ICD-10-CM | POA: Diagnosis not present

## 2023-05-29 DIAGNOSIS — E039 Hypothyroidism, unspecified: Secondary | ICD-10-CM | POA: Diagnosis not present

## 2023-05-29 DIAGNOSIS — K219 Gastro-esophageal reflux disease without esophagitis: Secondary | ICD-10-CM | POA: Diagnosis not present

## 2023-05-29 DIAGNOSIS — E7849 Other hyperlipidemia: Secondary | ICD-10-CM | POA: Diagnosis not present

## 2023-05-29 DIAGNOSIS — I7381 Erythromelalgia: Secondary | ICD-10-CM | POA: Diagnosis not present

## 2023-06-04 DIAGNOSIS — M2550 Pain in unspecified joint: Secondary | ICD-10-CM | POA: Diagnosis not present

## 2023-06-04 DIAGNOSIS — M4712 Other spondylosis with myelopathy, cervical region: Secondary | ICD-10-CM | POA: Diagnosis not present

## 2023-06-04 DIAGNOSIS — G4733 Obstructive sleep apnea (adult) (pediatric): Secondary | ICD-10-CM | POA: Diagnosis not present

## 2023-06-04 DIAGNOSIS — R030 Elevated blood-pressure reading, without diagnosis of hypertension: Secondary | ICD-10-CM | POA: Diagnosis not present

## 2023-06-04 DIAGNOSIS — E039 Hypothyroidism, unspecified: Secondary | ICD-10-CM | POA: Diagnosis not present

## 2023-06-04 DIAGNOSIS — E875 Hyperkalemia: Secondary | ICD-10-CM | POA: Diagnosis not present

## 2023-06-04 DIAGNOSIS — D7589 Other specified diseases of blood and blood-forming organs: Secondary | ICD-10-CM | POA: Diagnosis not present

## 2023-06-04 DIAGNOSIS — L4050 Arthropathic psoriasis, unspecified: Secondary | ICD-10-CM | POA: Diagnosis not present

## 2023-06-04 DIAGNOSIS — D473 Essential (hemorrhagic) thrombocythemia: Secondary | ICD-10-CM | POA: Diagnosis not present

## 2023-06-04 DIAGNOSIS — N400 Enlarged prostate without lower urinary tract symptoms: Secondary | ICD-10-CM | POA: Diagnosis not present

## 2023-06-04 DIAGNOSIS — E7849 Other hyperlipidemia: Secondary | ICD-10-CM | POA: Diagnosis not present

## 2023-06-04 DIAGNOSIS — G629 Polyneuropathy, unspecified: Secondary | ICD-10-CM | POA: Diagnosis not present

## 2023-06-14 ENCOUNTER — Ambulatory Visit (INDEPENDENT_AMBULATORY_CARE_PROVIDER_SITE_OTHER): Payer: Medicare Other | Admitting: Urology

## 2023-06-14 VITALS — BP 100/58 | HR 84

## 2023-06-14 DIAGNOSIS — R3912 Poor urinary stream: Secondary | ICD-10-CM

## 2023-06-14 DIAGNOSIS — N138 Other obstructive and reflux uropathy: Secondary | ICD-10-CM

## 2023-06-14 DIAGNOSIS — N401 Enlarged prostate with lower urinary tract symptoms: Secondary | ICD-10-CM | POA: Diagnosis not present

## 2023-06-14 LAB — URINALYSIS, ROUTINE W REFLEX MICROSCOPIC
Bilirubin, UA: NEGATIVE
Glucose, UA: NEGATIVE
Ketones, UA: NEGATIVE
Leukocytes,UA: NEGATIVE
Nitrite, UA: NEGATIVE
Protein,UA: NEGATIVE
RBC, UA: NEGATIVE
Specific Gravity, UA: 1.03 (ref 1.005–1.030)
Urobilinogen, Ur: 1 mg/dL (ref 0.2–1.0)
pH, UA: 5.5 (ref 5.0–7.5)

## 2023-06-14 LAB — BLADDER SCAN AMB NON-IMAGING: Scan Result: 0

## 2023-06-14 NOTE — Progress Notes (Unsigned)
06/14/2023 9:29 AM   Alfred Jones October 28, 1950 [id number]  Referring provider: Juliette Alcide, MD [address] [place],  [place] [address]  No chief complaint on file.   HPI: [name] is a 72yo here for followup for BPh with weak urinary stream. Flow PVR shows peak flow 9cc.sec. He drinks 16oz of water daily. His creatinine has been elevated recently. 18-21. Baseline 1.25 in 03/2023.   PMH: Past Medical History:  Diagnosis Date   Acid reflux    Anxiety    Arthritis    RHEUMATOID OR PSORIATIC ?   BCC (basal cell carcinoma of skin) 06/19/2007   left lower chest (CX35FU)   BCC (basal cell carcinoma of skin) infiltrated 03/11/2018   right nose (MOHS)   BCC (basal cell carcinoma of skin) ulcerated 07/11/2010   left cheek    Cancer (HCC)    Depression    Enlarged prostate    Hyperlipidemia    Numbness    Sleep apnea    TESTED BACK IN 2017   Superficial basal cell carcinoma (BCC) 03/11/2018   left shoulder     Surgical History: Past Surgical History:  Procedure Laterality Date   ANTERIOR CERVICAL DECOMP/DISCECTOMY FUSION N/A 09/23/2018   Procedure: Cervical Three-Four Cervical Four-Five Cervical Five-Six Anterior cervical decompression/discectomy/fusion;  Surgeon: Alfred Harman, MD;  Location[facility] OR;  Service: Neurosurgery;  Laterality: N/A;  Cervical Three-Four Cervical Four-Five Cervical Five-Six Anterior cervical decompression/discectomy/fusion   CARDIAC CATHETERIZATION     20 YRS AGO AT [facility]   CATARACT EXTRACTION Bilateral 2023   COLONOSCOPY WITH PROPOFOL N/A 06/23/2020   One small polyp at hepatic flexure that was sessile and ablated without specimen for pathologist. Surveillance 10 years if overall health permits.   ESOPHAGOGASTRODUODENOSCOPY (EGD) WITH PROPOFOL N/A 08/13/2019   prominent Schatzki ring s/p dilation, overlying erosive reflux esophagitis, small hiatal hernia   EYE SURGERY     METAL REMOVED FROM LEFT EYE  (IN OFFICE)   EYE SURGERY Left 10/2021    retinal tear   MALONEY DILATION N/A 08/13/2019   Procedure: MALONEY DILATION;  Surgeon: Alfred Ade, MD;  [facility];  Service: Endoscopy;  Laterality: N/A;   MOLE REMOVAL     POLYPECTOMY  06/23/2020   Procedure: POLYPECTOMY;  Surgeon: Alfred Ade, MD;  [facility]: [facility];  Service: Endoscopy;;   TONSILLECTOMY     as a child    Home Medications:  Allergies as of 06/14/2023   No Known Allergies      Medication List        Accurate as of June 14, 2023  9:29 AM. If you have any questions, ask your nurse or doctor.          acetaminophen 500 MG tablet Commonly known as: TYLENOL Take 1,000 mg by mouth 2 (two) times daily.   aspirin EC 81 MG tablet Take 81 mg by mouth daily. Swallow whole.   celecoxib 200 MG capsule Commonly known as: CELEBREX Take 200 mg by mouth 2 (two) times daily.   cetirizine 10 MG tablet Commonly known as: ZYRTEC Take 2 tablets by mouth daily.   CINNAMON PO Take 1,200 mg by mouth daily.   clotrimazole 1 % cream Commonly known as: LOTRIMIN Apply 1 application topically at bedtime. Feet   Co-Enzyme Q-10 100 MG Caps Take 100 mg by mouth daily.   diclofenac Sodium 1 % Gel Commonly known as: VOLTAREN Apply 2 g topically at bedtime. Joint pain   docusate  sodium 100 MG capsule Commonly known as: COLACE Take 200 mg by mouth 2 (two) times daily.   DULoxetine 60 MG capsule Commonly known as: CYMBALTA Take 60 mg by mouth daily.   fexofenadine 180 MG tablet Commonly known as: ALLEGRA Take 180 mg by mouth daily.   Flax Seed Oil 1000 MG Caps Take by mouth.   folic acid 1 MG tablet Commonly known as: FOLVITE Take 1 mg by mouth daily.   hydroxychloroquine 200 MG tablet Commonly known as: PLAQUENIL TAKE ONE TABLET BY MOUTH TWICE DAILY   hydroxyurea 500 MG capsule Commonly known as: HYDREA Take 500 mg by mouth as directed. 500 mg on Monday and Thursday   ketoconazole 2 % cream Commonly known as:  NIZORAL Apply 1 application. topically daily.   levothyroxine 50 MCG tablet Commonly known as: SYNTHROID Take 50 mcg by mouth daily.   linaclotide 290 MCG Caps capsule Commonly known as: LINZESS Take 1 capsule (290 mcg total) by mouth daily before breakfast.   MEGAVITE FRUITS & VEGGIES PO Take by mouth.   Melatonin 10 MG Caps Take 10 mg by mouth at bedtime.   Oxycodone HCl 10 MG Tabs Take by mouth.   pantoprazole 40 MG tablet Commonly known as: PROTONIX Take 1 tablet by mouth 2 (two) times daily.   polyethylene glycol 17 g packet Commonly known as: MIRALAX / GLYCOLAX Take 17 g by mouth daily as needed.   pregabalin 50 MG capsule Commonly known as: LYRICA Take by mouth 2 (two) times daily.   REFRESH OP Apply to eye 2 (two) times daily.   rosuvastatin 5 MG tablet Commonly known as: CRESTOR Take 5 mg by mouth 2 (two) times a week.   silodosin 8 MG Caps capsule Commonly known as: RAPAFLO Take 1 capsule (8 mg total) by mouth at bedtime.   sulfamethoxazole-trimethoprim 800-160 MG tablet Commonly known as: BACTRIM DS Take 1 tablet by mouth every 12 (twelve) hours.   tadalafil 5 MG tablet Commonly known as: CIALIS TAKE ONE TABLET BY MOUTH EVERY DAY   Tart Cherry 1200 MG Caps Take by mouth.   tiZANidine 4 MG tablet Commonly known as: ZANAFLEX Take by mouth.   traZODone 50 MG tablet Commonly known as: DESYREL Take 25 mg by mouth at bedtime.   TURMERIC PO Take by mouth at bedtime.   Valium 5 MG tablet Generic drug: diazepam take one tablet po 30 min prior to injection. May repeat x1 if needed   VITAMIN B-12 PO Take by mouth daily.   Vitamin D3 250 MCG (10000 UT) capsule Take 10,000 Units by mouth daily.        Allergies: No Known Allergies  Family History: Family History  Problem Relation Age of Onset   Heart disease Mother    Diabetes Mother    Heart attack Mother    Heart disease Father    Diabetes Father    Diabetes Sister    Seizures  Sister    Diabetes Brother    Heart attack Brother    Sleep apnea Brother    Heart attack Brother    Sleep apnea Brother    Heart attack Brother    Diabetes Brother    Heart attack Brother    Hypothyroidism Daughter    Hypothyroidism Son    Colon cancer Neg Hx     Social History:  reports that he quit smoking about 36 years ago. His smoking use included cigarettes. He started smoking about 52 years ago. He has a  16 pack-year smoking history. He has been exposed to tobacco smoke. He has never used smokeless tobacco. He reports that he does not drink alcohol and does not use drugs.  ROS: All other review of systems were reviewed and are negative except what is noted above in HPI  Physical Exam: BP (!) 100/58   Pulse 84   Constitutional:  Alert and oriented, No acute distress. HEENT: [place] AT, moist mucus membranes.  Trachea midline, no masses. Cardiovascular: No clubbing, cyanosis, or edema. Respiratory: Normal respiratory effort, no increased work of breathing. GI: Abdomen is soft, nontender, nondistended, no abdominal masses GU: No CVA tenderness.  Lymph: No cervical or inguinal lymphadenopathy. Skin: No rashes, bruises or suspicious lesions. Neurologic: Grossly intact, no focal deficits, moving all 4 extremities. Psychiatric: Normal mood and affect.  Laboratory Data: Lab Results  Component Value Date   WBC 14.9 (H) 04/10/2023   HGB 12.7 (L) 04/10/2023   HCT 36.6 (L) 04/10/2023   MCV 100.3 (H) 04/10/2023   PLT 695 (H) 04/10/2023    Lab Results  Component Value Date   CREATININE 1.25 04/10/2023    Lab Results  Component Value Date   PSA 1.12 12/02/2015    No results found for: "TESTOSTERONE"  Lab Results  Component Value Date   HGBA1C 5.2 04/15/2018    Urinalysis    Component Value Date/Time   APPEARANCEUR Clear 04/17/2023 1050   GLUCOSEU Negative 04/17/2023 1050   BILIRUBINUR Negative 04/17/2023 1050   PROTEINUR Negative 04/17/2023 1050   UROBILINOGEN  1.0 01/15/2020 1121   NITRITE Negative 04/17/2023 1050   LEUKOCYTESUR Negative 04/17/2023 1050    Lab Results  Component Value Date   LABMICR Comment 04/17/2023   WBCUA 0-5 01/14/2023   LABEPIT 0-10 01/14/2023   MUCUS Present 07/29/2020   BACTERIA None seen 01/14/2023    Pertinent Imaging: *** Results for orders placed during the hospital encounter of 07/11/20  DG Abd 1 View  Narrative CLINICAL DATA:  Constipation  EXAM: ABDOMEN - 1 VIEW  COMPARISON:  X-ray abdomen 04/18/2018, CT abdomen pelvis 06/03/2012  FINDINGS: The bowel gas pattern is normal. Stool within the ascending and proximal transverse colon. No radio-opaque calculi or other significant radiographic abnormality are seen. Visualized osseous structures are grossly unremarkable.  IMPRESSION: Nonobstructive bowel gas pattern with stool within the ascending colon proximal transverse colon.   Electronically Signed By: Tish Frederickson M.D. On: 07/11/2020 23:52  No results found for this or any previous visit.  No results found for this or any previous visit.  No results found for this or any previous visit.  No results found for this or any previous visit.  No valid procedures specified. No results found for this or any previous visit.  No results found for this or any previous visit.   Assessment & Plan:    1. Weak urinary stream -patient wishes to proceed with urolift. - Urinalysis, Routine w reflex microscopic - BLADDER SCAN AMB NON-IMAGING  2. Benign prostatic hyperplasia with urinary obstruction We discussed the management of his BPH including continued medical therapy, Rezum, Urolift, TURP and simple prostatectomy. After discussing the options the patient has elected to proceed with urolift. Risks/benefits/alternatives discussed.    No follow-ups on file.  Wilkie Aye, MD  [facility] [place]

## 2023-06-14 NOTE — Progress Notes (Unsigned)
Uroflow  Peak Flow: 9ml Average Flow: 4ml Voided Volume: Voiding Time: 53sec Flow Time: 43sec Time to Peak Flow: 11sec  PVR Volume: 0ml

## 2023-06-15 LAB — PSA: Prostate Specific Ag, Serum: 2.9 ng/mL (ref 0.0–4.0)

## 2023-06-19 ENCOUNTER — Encounter: Payer: Self-pay | Admitting: Urology

## 2023-06-19 NOTE — Patient Instructions (Signed)

## 2023-07-01 NOTE — Progress Notes (Deleted)
Office Visit Note  Patient: Alfred Jones             Date of Birth: 07-21-1951           MRN: [id number]             PCP: Juliette Alcide, MD Referring: Juliette Alcide, MD Visit Date: 07/11/2023 Occupation: @GUAROCC @  Subjective:  No chief complaint on file.   History of Present Illness: Alfred Jones is a 72 y.o. male ***     Activities of Daily Living:  Patient reports morning stiffness for *** {minute/hour:19697}.   Patient {ACTIONS;DENIES/REPORTS:21021675::"Denies"} nocturnal pain.  Difficulty dressing/grooming: {ACTIONS;DENIES/REPORTS:21021675::"Denies"} Difficulty climbing stairs: {ACTIONS;DENIES/REPORTS:21021675::"Denies"} Difficulty getting out of chair: {ACTIONS;DENIES/REPORTS:21021675::"Denies"} Difficulty using hands for taps, buttons, cutlery, and/or writing: {ACTIONS;DENIES/REPORTS:21021675::"Denies"}  No Rheumatology ROS completed.   PMFS History:  Patient Active Problem List   Diagnosis Date Noted   Cardiac chest pain 10/02/2022   Hypoglycemia 10/02/2022   History of dysarthria 10/02/2022   Urinary tract infection without hematuria 02/15/2022   Hematochezia 05/17/2020   Chemotherapy follow-up examination 01/27/2020   Essential thrombocythemia (HCC) 01/27/2020   Erythromelalgia (HCC) 01/04/2020   Psoriasis 12/13/2019   Family history of coronary artery disease in brother 04/28/2019   Impotence of organic origin 04/28/2019   Pain in joint, multiple sites 04/28/2019   Occlusion and stenosis of carotid artery 04/28/2019   Other specified disorders of male genital organs 04/28/2019   Psoriatic arthropathy (HCC) 04/28/2019   Vitamin D deficiency 04/28/2019   Pain in joint 03/25/2019   Myalgia and myositis 03/25/2019   Other, multiple, and unspecified sites, insect bite, nonvenomous 03/25/2019   Herniated cervical disc without myelopathy 09/23/2018   Cervical spinal stenosis 06/02/2018   Gait abnormality 05/01/2018   Spinal stenosis of lumbar  region 05/01/2018   Chronic right-sided low back pain with right-sided sciatica 04/15/2018   Right groin pain 04/15/2018   Edema 06/04/2017   Arthropathy, multiple sites 03/15/2017   Acute upper respiratory infection 12/12/2016   Wheezing 12/12/2016   Other malaise and fatigue 11/04/2016   Paresthesia 09/18/2016   Mononeuritis 09/04/2016   Disturbance of skin sensation 09/03/2016   Pain in soft tissues of limb 09/03/2016   Other atopic dermatitis and related conditions 07/22/2016   Sleep apnea 07/17/2016   Other dyspnea and respiratory abnormality 04/08/2016   Hyperlipidemia 12/06/2015   BPH (benign prostatic hyperplasia) 12/06/2015   Depression 12/06/2015   Obesity, unspecified 12/06/2015   GERD 08/31/2010   Constipation 08/31/2010   DYSPHAGIA 08/31/2010   ABDOMINAL PAIN-MULTIPLE SITES 08/31/2010    Past Medical History:  Diagnosis Date   Acid reflux    Anxiety    Arthritis    RHEUMATOID OR PSORIATIC ?   BCC (basal cell carcinoma of skin) 06/19/2007   left lower chest (CX35FU)   BCC (basal cell carcinoma of skin) infiltrated 03/11/2018   right nose (MOHS)   BCC (basal cell carcinoma of skin) ulcerated 07/11/2010   left cheek    Cancer (HCC)    Depression    Enlarged prostate    Hyperlipidemia    Numbness    Sleep apnea    TESTED BACK IN 2017   Superficial basal cell carcinoma (BCC) 03/11/2018   left shoulder     Family History  Problem Relation Age of Onset   Heart disease Mother    Diabetes Mother    Heart attack Mother    Heart disease Father    Diabetes Father    Diabetes Sister  Seizures Sister    Diabetes Brother    Heart attack Brother    Sleep apnea Brother    Heart attack Brother    Sleep apnea Brother    Heart attack Brother    Diabetes Brother    Heart attack Brother    Hypothyroidism Daughter    Hypothyroidism Son    Colon cancer Neg Hx    Past Surgical History:  Procedure Laterality Date   ANTERIOR CERVICAL DECOMP/DISCECTOMY FUSION  N/A 09/23/2018   Procedure: Cervical Three-Four Cervical Four-Five Cervical Five-Six Anterior cervical decompression/discectomy/fusion;  Surgeon: Maeola Harman, MD;  Location: [facility] OR;  Service: Neurosurgery;  Laterality: N/A;  Cervical Three-Four Cervical Four-Five Cervical Five-Six Anterior cervical decompression/discectomy/fusion   CARDIAC CATHETERIZATION     20 YRS AGO AT [facility]   CATARACT EXTRACTION Bilateral 2023   COLONOSCOPY WITH PROPOFOL N/A 06/23/2020   One small polyp at hepatic flexure that was sessile and ablated without specimen for pathologist. Surveillance 10 years if overall health permits.   ESOPHAGOGASTRODUODENOSCOPY (EGD) WITH PROPOFOL N/A 08/13/2019   prominent Schatzki ring s/p dilation, overlying erosive reflux esophagitis, small hiatal hernia   EYE SURGERY     METAL REMOVED FROM LEFT EYE  (IN OFFICE)   EYE SURGERY Left 10/2021   retinal tear   MALONEY DILATION N/A 08/13/2019   Procedure: MALONEY DILATION;  Surgeon: Corbin Ade, MD;  Location: [facility];  Service: Endoscopy;  Laterality: N/A;   MOLE REMOVAL     POLYPECTOMY  06/23/2020   Procedure: POLYPECTOMY;  Surgeon: Corbin Ade, MD;  Location: [facility];  Service: Endoscopy;;   TONSILLECTOMY     as a child   Social History   Social History Narrative   Lives at home with his wife and son.   Right-handed.   2-4 cups caffeine per day.   Immunization History  Administered Date(s) Administered   Influenza Split 07/26/2015   Tdap 10/05/2002     Objective: Vital Signs: There were no vitals taken for this visit.   Physical Exam   Musculoskeletal Exam: ***  CDAI Exam: CDAI Score: -- Patient Global: --; Provider Global: -- Swollen: --; Tender: -- Joint Exam 07/11/2023   No joint exam has been documented for this visit   There is currently no information documented on the homunculus. Go to the Rheumatology activity and complete the homunculus joint exam.  Investigation: No additional  findings.  Imaging: No results found.  Recent Labs: Lab Results  Component Value Date   WBC 14.9 (H) 04/10/2023   HGB 12.7 (L) 04/10/2023   PLT 695 (H) 04/10/2023   NA 138 04/10/2023   K 5.2 04/10/2023   CL 106 04/10/2023   CO2 25 04/10/2023   GLUCOSE 111 (H) 04/10/2023   BUN 26 (H) 04/10/2023   CREATININE 1.25 04/10/2023   BILITOT 0.4 04/10/2023   ALKPHOS 56 04/15/2018   AST 12 04/10/2023   ALT 14 04/10/2023   PROT 6.5 04/10/2023   ALBUMIN 4.5 04/15/2018   CALCIUM 8.8 04/10/2023   GFRAA 78 12/08/2020    Speciality Comments: PLQ Eye Exam 03/04/2023 WNL Groat f/u 1 year  Methotrexate 2 years 09/18 -no response  Otezla 1 year no response treated at [facility] rheumatology for possible psoriatic arthritis  Procedures:  No procedures performed Allergies: Patient has no known allergies.   Assessment / Plan:     Visit Diagnoses: No diagnosis found.  Orders: No orders of the defined types were placed in this encounter.  No orders  of the defined types were placed in this encounter.   Face-to-face time spent with patient was *** minutes. Greater than 50% of time was spent in counseling and coordination of care.  Follow-Up Instructions: No follow-ups on file.   Ellen Henri, CMA  Note - This record has been created using Animal nutritionist.  Chart creation errors have been sought, but may not always  have been located. Such creation errors do not reflect on  the standard of medical care.

## 2023-07-03 ENCOUNTER — Ambulatory Visit: Payer: Medicare Other | Admitting: Urology

## 2023-07-03 LAB — AMB RESULTS CONSOLE CBG: Glucose: 147

## 2023-07-05 NOTE — Progress Notes (Unsigned)
Office Visit Note  Patient: Alfred Jones             Date of Birth: [birth year]           MRN: [id number]             PCP: Juliette Alcide, MD Referring: Juliette Alcide, MD Visit Date: 07/09/2023 Occupation: @GUAROCC @  Subjective:  No chief complaint on file.   History of Present Illness: Alfred Jones is a 72 y.o. male ***     Activities of Daily Living:  Patient reports morning stiffness for *** {minute/hour:19697}.   Patient {ACTIONS;DENIES/REPORTS:21021675::"Denies"} nocturnal pain.  Difficulty dressing/grooming: {ACTIONS;DENIES/REPORTS:21021675::"Denies"} Difficulty climbing stairs: {ACTIONS;DENIES/REPORTS:21021675::"Denies"} Difficulty getting out of chair: {ACTIONS;DENIES/REPORTS:21021675::"Denies"} Difficulty using hands for taps, buttons, cutlery, and/or writing: {ACTIONS;DENIES/REPORTS:21021675::"Denies"}  No Rheumatology ROS completed.   PMFS History:  Patient Active Problem List   Diagnosis Date Noted  . Cardiac chest pain 10/02/2022  . Hypoglycemia 10/02/2022  . History of dysarthria 10/02/2022  . Urinary tract infection without hematuria 02/15/2022  . Hematochezia 05/17/2020  . Chemotherapy follow-up examination 01/27/2020  . Essential thrombocythemia (HCC) 01/27/2020  . Erythromelalgia (HCC) 01/04/2020  . Psoriasis 12/13/2019  . Family history of coronary artery disease in brother 04/28/2019  . Impotence of organic origin 04/28/2019  . Pain in joint, multiple sites 04/28/2019  . Occlusion and stenosis of carotid artery 04/28/2019  . Other specified disorders of male genital organs 04/28/2019  . Psoriatic arthropathy (HCC) 04/28/2019  . Vitamin D deficiency 04/28/2019  . Pain in joint 03/25/2019  . Myalgia and myositis 03/25/2019  . Other, multiple, and unspecified sites, insect bite, nonvenomous 03/25/2019  . Herniated cervical disc without myelopathy 09/23/2018  . Cervical spinal stenosis 06/02/2018  . Gait abnormality 05/01/2018  . Spinal  stenosis of lumbar region 05/01/2018  . Chronic right-sided low back pain with right-sided sciatica 04/15/2018  . Right groin pain 04/15/2018  . Edema 06/04/2017  . Arthropathy, multiple sites 03/15/2017  . Acute upper respiratory infection 12/12/2016  . Wheezing 12/12/2016  . Other malaise and fatigue 11/04/2016  . Paresthesia 09/18/2016  . Mononeuritis 09/04/2016  . Disturbance of skin sensation 09/03/2016  . Pain in soft tissues of limb 09/03/2016  . Other atopic dermatitis and related conditions 07/22/2016  . Sleep apnea 07/17/2016  . Other dyspnea and respiratory abnormality 04/08/2016  . Hyperlipidemia 12/06/2015  . BPH (benign prostatic hyperplasia) 12/06/2015  . Depression 12/06/2015  . Obesity, unspecified 12/06/2015  . GERD 08/31/2010  . Constipation 08/31/2010  . DYSPHAGIA 08/31/2010  . ABDOMINAL PAIN-MULTIPLE SITES 08/31/2010    Past Medical History:  Diagnosis Date  . Acid reflux   . Anxiety   . Arthritis    RHEUMATOID OR PSORIATIC ?  Marland Kitchen BCC (basal cell carcinoma of skin) 06/19/2007   left lower chest (CX35FU)  . BCC (basal cell carcinoma of skin) infiltrated 03/11/2018   right nose (MOHS)  . BCC (basal cell carcinoma of skin) ulcerated 07/11/2010   left cheek   . Cancer (HCC)   . Depression   . Enlarged prostate   . Hyperlipidemia   . Numbness   . Sleep apnea    TESTED BACK IN 2017  . Superficial basal cell carcinoma (BCC) 03/11/2018   left shoulder     Family History  Problem Relation Age of Onset  . Heart disease Mother   . Diabetes Mother   . Heart attack Mother   . Heart disease Father   . Diabetes Father   . Diabetes Sister   .  Seizures Sister   . Diabetes Brother   . Heart attack Brother   . Sleep apnea Brother   . Heart attack Brother   . Sleep apnea Brother   . Heart attack Brother   . Diabetes Brother   . Heart attack Brother   . Hypothyroidism Daughter   . Hypothyroidism Son   . Colon cancer Neg Hx    Past Surgical History:   Procedure Laterality Date  . ANTERIOR CERVICAL DECOMP/DISCECTOMY FUSION N/A 09/23/2018   Procedure: Cervical Three-Four Cervical Four-Five Cervical Five-Six Anterior cervical decompression/discectomy/fusion;  Surgeon: Maeola Harman, MD;  Location: [facility] OR;  Service: Neurosurgery;  Laterality: N/A;  Cervical Three-Four Cervical Four-Five Cervical Five-Six Anterior cervical decompression/discectomy/fusion  . CARDIAC CATHETERIZATION     20 YRS AGO AT [facility]  . CATARACT EXTRACTION Bilateral 2023  . COLONOSCOPY WITH PROPOFOL N/A 06/23/2020   One small polyp at hepatic flexure that was sessile and ablated without specimen for pathologist. Surveillance 10 years if overall health permits.  . ESOPHAGOGASTRODUODENOSCOPY (EGD) WITH PROPOFOL N/A 08/13/2019   prominent Schatzki ring s/p dilation, overlying erosive reflux esophagitis, small hiatal hernia  . EYE SURGERY     METAL REMOVED FROM LEFT EYE  (IN OFFICE)  . EYE SURGERY Left 10/2021   retinal tear  . MALONEY DILATION N/A 08/13/2019   Procedure: Elease Hashimoto DILATION;  Surgeon: Corbin Ade, MD;  [facility];  Service: Endoscopy;  Laterality: N/A;  . MOLE REMOVAL    . POLYPECTOMY  06/23/2020   Procedure: POLYPECTOMY;  Surgeon: Corbin Ade, MD;  Location: [facility];  Service: Endoscopy;;  . TONSILLECTOMY     as a child   Social History   Social History Narrative   Lives at home with his wife and son.   Right-handed.   2-4 cups caffeine per day.   Immunization History  Administered Date(s) Administered  . Influenza Split 07/26/2015  . Tdap 10/05/2002     Objective: Vital Signs: There were no vitals taken for this visit.   Physical Exam   Musculoskeletal Exam: ***  CDAI Exam: CDAI Score: -- Patient Global: --; Provider Global: -- Swollen: --; Tender: -- Joint Exam 07/09/2023   No joint exam has been documented for this visit   There is currently no information documented on the homunculus. Go to the  Rheumatology activity and complete the homunculus joint exam.  Investigation: No additional findings.  Imaging: No results found.  Recent Labs: Lab Results  Component Value Date   WBC 14.9 (H) 04/10/2023   HGB 12.7 (L) 04/10/2023   PLT 695 (H) 04/10/2023   NA 138 04/10/2023   K 5.2 04/10/2023   CL 106 04/10/2023   CO2 25 04/10/2023   GLUCOSE 111 (H) 04/10/2023   BUN 26 (H) 04/10/2023   CREATININE 1.25 04/10/2023   BILITOT 0.4 04/10/2023   ALKPHOS 56 04/15/2018   AST 12 04/10/2023   ALT 14 04/10/2023   PROT 6.5 04/10/2023   ALBUMIN 4.5 04/15/2018   CALCIUM 8.8 04/10/2023   GFRAA 78 12/08/2020    Speciality Comments: PLQ Eye Exam 03/04/2023 WNL Groat f/u 1 year  Methotrexate 2 years 09/18 -no response  Otezla 1 year no response treated at [facility] rheumatology for possible psoriatic arthritis  Procedures:  No procedures performed Allergies: Patient has no known allergies.   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  No orders of the defined types were placed in this encounter.   Face-to-face time spent with patient was *** minutes. Greater than 50% of time was spent in counseling and coordination of care.  Follow-Up Instructions: No follow-ups on file.   Gearldine Bienenstock, PA-C  Note - This record has been created using Dragon software.  Chart creation errors have been  sought, but may not always  have been located. Such creation errors do not reflect on  the standard of medical care.

## 2023-07-09 ENCOUNTER — Ambulatory Visit: Payer: Medicare Other | Attending: Physician Assistant | Admitting: Physician Assistant

## 2023-07-09 ENCOUNTER — Other Ambulatory Visit: Payer: Self-pay

## 2023-07-09 ENCOUNTER — Encounter: Payer: Self-pay | Admitting: Physician Assistant

## 2023-07-09 VITALS — BP 124/78 | HR 54 | Resp 17 | Ht 70.0 in | Wt 226.4 lb

## 2023-07-09 DIAGNOSIS — M5136 Other intervertebral disc degeneration, lumbar region: Secondary | ICD-10-CM | POA: Insufficient documentation

## 2023-07-09 DIAGNOSIS — M5416 Radiculopathy, lumbar region: Secondary | ICD-10-CM | POA: Diagnosis not present

## 2023-07-09 DIAGNOSIS — I7381 Erythromelalgia: Secondary | ICD-10-CM | POA: Diagnosis not present

## 2023-07-09 DIAGNOSIS — E559 Vitamin D deficiency, unspecified: Secondary | ICD-10-CM | POA: Diagnosis not present

## 2023-07-09 DIAGNOSIS — Z8639 Personal history of other endocrine, nutritional and metabolic disease: Secondary | ICD-10-CM | POA: Insufficient documentation

## 2023-07-09 DIAGNOSIS — D0439 Carcinoma in situ of skin of other parts of face: Secondary | ICD-10-CM | POA: Insufficient documentation

## 2023-07-09 DIAGNOSIS — M19042 Primary osteoarthritis, left hand: Secondary | ICD-10-CM | POA: Diagnosis not present

## 2023-07-09 DIAGNOSIS — M5030 Other cervical disc degeneration, unspecified cervical region: Secondary | ICD-10-CM | POA: Insufficient documentation

## 2023-07-09 DIAGNOSIS — M19072 Primary osteoarthritis, left ankle and foot: Secondary | ICD-10-CM | POA: Diagnosis not present

## 2023-07-09 DIAGNOSIS — D473 Essential (hemorrhagic) thrombocythemia: Secondary | ICD-10-CM | POA: Insufficient documentation

## 2023-07-09 DIAGNOSIS — M1990 Unspecified osteoarthritis, unspecified site: Secondary | ICD-10-CM | POA: Diagnosis not present

## 2023-07-09 DIAGNOSIS — M170 Bilateral primary osteoarthritis of knee: Secondary | ICD-10-CM | POA: Diagnosis not present

## 2023-07-09 DIAGNOSIS — G589 Mononeuropathy, unspecified: Secondary | ICD-10-CM | POA: Insufficient documentation

## 2023-07-09 DIAGNOSIS — M19071 Primary osteoarthritis, right ankle and foot: Secondary | ICD-10-CM | POA: Insufficient documentation

## 2023-07-09 DIAGNOSIS — Z79899 Other long term (current) drug therapy: Secondary | ICD-10-CM

## 2023-07-09 DIAGNOSIS — G629 Polyneuropathy, unspecified: Secondary | ICD-10-CM | POA: Insufficient documentation

## 2023-07-09 DIAGNOSIS — Z840 Family history of diseases of the skin and subcutaneous tissue: Secondary | ICD-10-CM | POA: Diagnosis not present

## 2023-07-09 DIAGNOSIS — M19041 Primary osteoarthritis, right hand: Secondary | ICD-10-CM | POA: Insufficient documentation

## 2023-07-09 DIAGNOSIS — Z8719 Personal history of other diseases of the digestive system: Secondary | ICD-10-CM | POA: Diagnosis not present

## 2023-07-09 NOTE — Telephone Encounter (Signed)
Pending lab results, patient will be starting sulfasalazine per Sherron Ales, PA-C. Rx  Consent was obtained and sent to the scan center.  Patient was advised that his new lab schedule  and medication dose will be advised based on lab results from today's labs per [facility].   PLEASE CALL PATIENT'S WIFE WITH LAB RESULTS AND MEDICATION DOSAGE INFORMATION. (SHE IS LISTED ON DPR) THANK YOU!

## 2023-07-09 NOTE — Patient Instructions (Addendum)
Standing Labs We placed an order today for your standing lab work.   Please have your standing labs drawn (schedule will be advised based on lab results from today, 07/09/2023)  Please have your labs drawn 2 weeks prior to your appointment so that the provider can discuss your lab results at your appointment, if possible.  Please note that you may see your imaging and lab results in MyChart before we have reviewed them. We will contact you once all results are reviewed. Please allow our office up to 72 hours to thoroughly review all of the results before contacting the office for clarification of your results.  WALK-IN LAB HOURS  Monday through Thursday from 8:00 am -12:30 pm and 1:00 pm-5:00 pm and Friday from 8:00 am-12:00 pm.  Patients with office visits requiring labs will be seen before walk-in labs.  You may encounter longer than normal wait times. Please allow additional time. Wait times may be shorter on  Monday and Thursday afternoons.  We do not book appointments for walk-in labs. We appreciate your patience and understanding with our staff.   Labs are drawn by [facility]. Please bring your co-pay at the time of your lab draw.  You may receive a bill from [facility] for your lab work.  Please note if you are on Hydroxychloroquine and and an order has been placed for a Hydroxychloroquine level,  you will need to have it drawn 4 hours or more after your last dose.  If you wish to have your labs drawn at another location, please call the office 24 hours in advance so we can fax the orders.  The office is located at [address], [place], [place] [address]   If you have any questions regarding directions or hours of operation,  please call [phone].   As a reminder, please drink plenty of water prior to coming for your lab work. Thanks!    Sulfasalazine Tablets What is this medication? SULFASALAZINE (sul fa SAL a zeen) treats ulcerative colitis. It works by decreasing  inflammation. It belongs to a group of medications called salicylates. This medicine may be used for other purposes; ask your health care provider or pharmacist if you have questions. COMMON BRAND NAME(S): Azulfidine, Sulfazine What should I tell my care team before I take this medication? They need to know if you have any of these conditions: Asthma Blood disorders or anemia Glucose-6-phosphate dehydrogenase (G6PD) deficiency Intestinal obstruction Kidney disease Liver disease Porphyria Urinary tract obstruction An unusual reaction to sulfasalazine, sulfa medications, salicylates, or other medications, foods, dyes, or preservatives Pregnant or trying to get pregnant Breast-feeding How should I use this medication? Take this medication by mouth with a full glass of water. Take it as directed on the prescription label at the same time every day. You can take it with or without food. If it upsets your stomach, take it with food. Keep taking it unless your care team tells you to stop. Talk to your care team about the use of this medication in children. While this medication may be prescribed for children as young as 6 years for selected conditions, precautions do apply. Patients over 59 years old may have a stronger reaction and need a smaller dose. Overdosage: If you think you have taken too much of this medicine contact a poison control center or emergency room at once. NOTE: This medicine is only for you. Do not share this medicine with others. What if I miss a dose? If you miss a  dose, take it as soon as you can. If it is almost time for your next dose, take only that dose. Do not take double or extra doses. What may interact with this medication? Digoxin Folic acid This list may not describe all possible interactions. Give your health care provider a list of all the medicines, herbs, non-prescription drugs, or dietary supplements you use. Also tell them if you smoke, drink alcohol, or  use illegal drugs. Some items may interact with your medicine. What should I watch for while using this medication? Visit your care team for regular checks on your progress. Tell your care team if your symptoms do not start to get better or if they get worse. You will need frequent blood and urine checks. This medication can make you more sensitive to the sun. Keep out of the sun. If you cannot avoid being in the sun, wear protective clothing and use sunscreen. Do not use sun lamps or tanning beds/booths. Drink plenty of water while taking this medication. What side effects may I notice from receiving this medication? Side effects that you should report to your care team as soon as possible: Allergic reactions--skin rash, itching, hives, swelling of the face, lips, tongue, or throat Aplastic anemia--unusual weakness or fatigue, dizziness, headache, trouble breathing, increased bleeding or bruising Dry cough, shortness of breath or trouble breathing Heart muscle inflammation--unusual weakness or fatigue, shortness of breath, chest pain, fast or irregular heartbeat, dizziness, swelling of the ankles, feet, or hands Infection--fever, chills, cough, sore throat, wounds that don't heal, pain or trouble when passing urine, general feeling of discomfort or being unwell Kidney injury--decrease in the amount of urine, swelling of the ankles, hands, or feet Liver injury--right upper belly pain, loss of appetite, nausea, light-colored stool, dark yellow or brown urine, yellowing skin or eyes, unusual weakness or fatigue Rash, fever, and swollen lymph nodes Redness, blistering, peeling, or loosening of the skin, including inside the mouth Side effects that usually do not require medical attention (report to your care team if they continue or are bothersome): Dark yellow or orange saliva, sweat, or urine Dizziness Headache Loss of appetite Nausea Upset stomach Vomiting This list may not describe all  possible side effects. Call your doctor for medical advice about side effects. You may report side effects to FDA at 1-800-FDA-1088. Where should I keep my medication? Keep out of the reach of children and pets. Store at room temperature between 15 and 30 degrees C (59 and 86 degrees F). Get rid of any unused medication after the expiration date. To get rid of medications that are no longer needed or have expired: Take the medications to a medication take-back program. Check with your pharmacy or law enforcement to find a location. If you cannot return the medication, check the label or package insert to see if the medication should be thrown out in the garbage or flushed down the toilet. If you are not sure, ask your care team. If it is safe to put it in the trash, take the medication out of the container. Mix the medication with cat litter, dirt, coffee grounds, or other unwanted substance. Seal the mixture in a bag or container. Put it in the trash. NOTE: This sheet is a summary. It may not cover all possible information. If you have questions about this medicine, talk to your doctor, pharmacist, or health care provider.  2024 Elsevier/Gold Standard (2021-08-31 00:00:00)

## 2023-07-10 NOTE — Progress Notes (Signed)
Renal function has improved-GFR is 71. Still recommend trying to titrate off of celebrex.   Rest of CMP WNL. Patient remains anemic and plt count remains elevated but improved.    G6PD is pending.

## 2023-07-11 ENCOUNTER — Ambulatory Visit: Payer: Medicare Other | Admitting: Physician Assistant

## 2023-07-11 DIAGNOSIS — M170 Bilateral primary osteoarthritis of knee: Secondary | ICD-10-CM

## 2023-07-11 DIAGNOSIS — Z8719 Personal history of other diseases of the digestive system: Secondary | ICD-10-CM

## 2023-07-11 DIAGNOSIS — D473 Essential (hemorrhagic) thrombocythemia: Secondary | ICD-10-CM

## 2023-07-11 DIAGNOSIS — G589 Mononeuropathy, unspecified: Secondary | ICD-10-CM

## 2023-07-11 DIAGNOSIS — M19071 Primary osteoarthritis, right ankle and foot: Secondary | ICD-10-CM

## 2023-07-11 DIAGNOSIS — Z8639 Personal history of other endocrine, nutritional and metabolic disease: Secondary | ICD-10-CM

## 2023-07-11 DIAGNOSIS — I7381 Erythromelalgia: Secondary | ICD-10-CM

## 2023-07-11 DIAGNOSIS — G629 Polyneuropathy, unspecified: Secondary | ICD-10-CM

## 2023-07-11 DIAGNOSIS — G4709 Other insomnia: Secondary | ICD-10-CM

## 2023-07-11 DIAGNOSIS — M5136 Other intervertebral disc degeneration, lumbar region: Secondary | ICD-10-CM

## 2023-07-11 DIAGNOSIS — D0439 Carcinoma in situ of skin of other parts of face: Secondary | ICD-10-CM

## 2023-07-11 DIAGNOSIS — M5030 Other cervical disc degeneration, unspecified cervical region: Secondary | ICD-10-CM

## 2023-07-11 DIAGNOSIS — R21 Rash and other nonspecific skin eruption: Secondary | ICD-10-CM

## 2023-07-11 DIAGNOSIS — M19041 Primary osteoarthritis, right hand: Secondary | ICD-10-CM

## 2023-07-11 DIAGNOSIS — Z79899 Other long term (current) drug therapy: Secondary | ICD-10-CM

## 2023-07-11 DIAGNOSIS — E559 Vitamin D deficiency, unspecified: Secondary | ICD-10-CM

## 2023-07-11 DIAGNOSIS — M1990 Unspecified osteoarthritis, unspecified site: Secondary | ICD-10-CM

## 2023-07-11 DIAGNOSIS — Z840 Family history of diseases of the skin and subcutaneous tissue: Secondary | ICD-10-CM

## 2023-07-14 LAB — CBC WITH DIFFERENTIAL/PLATELET
Absolute Monocytes: 713 cells/uL (ref 200–950)
Basophils Absolute: 81 cells/uL (ref 0–200)
Basophils Relative: 1.4 %
Eosinophils Absolute: 157 cells/uL (ref 15–500)
Eosinophils Relative: 2.7 %
HCT: 32.8 % — ABNORMAL LOW (ref 38.5–50.0)
Hemoglobin: 11.4 g/dL — ABNORMAL LOW (ref 13.2–17.1)
Lymphs Abs: 1902 cells/uL (ref 850–3900)
MCH: 37.3 pg — ABNORMAL HIGH (ref 27.0–33.0)
MCHC: 34.8 g/dL (ref 32.0–36.0)
MCV: 107.2 fL — ABNORMAL HIGH (ref 80.0–100.0)
MPV: 9.5 fL (ref 7.5–12.5)
Monocytes Relative: 12.3 %
Neutro Abs: 2946 cells/uL (ref 1500–7800)
Neutrophils Relative %: 50.8 %
Platelets: 414 10*3/uL — ABNORMAL HIGH (ref 140–400)
RBC: 3.06 10*6/uL — ABNORMAL LOW (ref 4.20–5.80)
RDW: 13.4 % (ref 11.0–15.0)
Total Lymphocyte: 32.8 %
WBC: 5.8 10*3/uL (ref 3.8–10.8)

## 2023-07-14 LAB — COMPLETE METABOLIC PANEL WITH GFR
AG Ratio: 2 (calc) (ref 1.0–2.5)
ALT: 11 U/L (ref 9–46)
AST: 15 U/L (ref 10–35)
Albumin: 4.3 g/dL (ref 3.6–5.1)
Alkaline phosphatase (APISO): 48 U/L (ref 35–144)
BUN: 15 mg/dL (ref 7–25)
CO2: 26 mmol/L (ref 20–32)
Calcium: 8.8 mg/dL (ref 8.6–10.3)
Chloride: 103 mmol/L (ref 98–110)
Creat: 1.11 mg/dL (ref 0.70–1.28)
Globulin: 2.2 g/dL (calc) (ref 1.9–3.7)
Glucose, Bld: 78 mg/dL (ref 65–99)
Potassium: 4.7 mmol/L (ref 3.5–5.3)
Sodium: 140 mmol/L (ref 135–146)
Total Bilirubin: 0.6 mg/dL (ref 0.2–1.2)
Total Protein: 6.5 g/dL (ref 6.1–8.1)
eGFR: 71 mL/min/{1.73_m2} (ref >=60)

## 2023-07-14 LAB — GLUCOSE 6 PHOSPHATE DEHYDROGENASE: G-6PDH: 18.5 U/g Hgb (ref 7.0–20.5)

## 2023-07-15 NOTE — Progress Notes (Signed)
G6PDH WNL  Ok to initiate sulfasalazine with close lab monitoring  Please advise patient to take sulfasalazine 500 mg 1 tablet twice daily x2 weeks if labs are stable we may consider increasing sulfasalazine to 500 mg 2 tablets BID

## 2023-07-16 DIAGNOSIS — D473 Essential (hemorrhagic) thrombocythemia: Secondary | ICD-10-CM | POA: Diagnosis not present

## 2023-07-16 DIAGNOSIS — L4050 Arthropathic psoriasis, unspecified: Secondary | ICD-10-CM | POA: Diagnosis not present

## 2023-07-16 MED ORDER — SULFASALAZINE 500 MG PO TABS: 500.0000 mg | ORAL_TABLET | Freq: Two times a day (BID) | ORAL | 0 refills | Status: DC

## 2023-07-16 NOTE — Telephone Encounter (Signed)
G6PDH WNL Ok to initiate sulfasalazine with close lab monitoring Please advise patient to take sulfasalazine 500 mg 1 tablet twice daily x2 weeks if labs are stable we may consider increasing sulfasalazine to 500 mg 2 tablets BID

## 2023-07-16 NOTE — Addendum Note (Signed)
Addended by: Henriette Combs on: 07/16/2023 10:42 AM   Modules accepted: Orders

## 2023-07-16 NOTE — Telephone Encounter (Signed)
Patient's wife advised

## 2023-07-17 ENCOUNTER — Ambulatory Visit: Payer: Medicare Other | Admitting: Urology

## 2023-07-24 DIAGNOSIS — Z23 Encounter for immunization: Secondary | ICD-10-CM | POA: Diagnosis not present

## 2023-07-24 DIAGNOSIS — L4050 Arthropathic psoriasis, unspecified: Secondary | ICD-10-CM | POA: Diagnosis not present

## 2023-07-24 DIAGNOSIS — D473 Essential (hemorrhagic) thrombocythemia: Secondary | ICD-10-CM | POA: Diagnosis not present

## 2023-07-30 ENCOUNTER — Encounter: Payer: Self-pay | Admitting: *Deleted

## 2023-07-30 NOTE — Progress Notes (Signed)
Pt attended 07/03/2023 screening event where his b/p was 129/74 and his blood sugar was 147. At the event, the pt did not note a PCP name and did not identify any SDOH insecurities. Chart review indicates pt sees dr. Quintin Alto from [facility] as his PCP and last saw Dr. Leandrew Koyanagi on 06/04/23 (PCP past visits visible in [facility] but not PCP notes or future appt). Last glucose value noted from [facility] on 07/16/23 was 114. Per chart review, pt also has regular f/u with [facility], [facility], and [facility] specialists, including future appt with all these specialists from Nov, 2024 thru Jan, 2025. No additional health equity team support indicated at this time.

## 2023-08-01 ENCOUNTER — Other Ambulatory Visit: Payer: Self-pay | Admitting: Physician Assistant

## 2023-08-01 NOTE — Telephone Encounter (Signed)
Last Fill: 07/16/2023 #28  Labs: 07/16/2023 RBC 3.02, HGB 11.5, HCT 33.8, MCV 111.9, MCH 38.1, Platelet 457, BUN 29, Creatinine 1.59, eGFR 46, Calcium 8.4, AST 12   Next Visit: 10/14/2023  Last Visit: 07/09/2023  DX: Inflammatory arthritis   Current Dose per office note 07/09/2023: If his GFR is greater than 60 plan to send in sulfasalazine 500 mg 2 tablets by mouth twice daily. If GFR is less than 60 we will need to titrate the dose slowly with close lab monitoring.   Okay to refill Sulfasalazine?

## 2023-08-01 NOTE — Telephone Encounter (Signed)
Last Fill: 07/16/2023  Labs: 07/16/2023 RBC 3.02, Hgb 11.5, Hct 33.8, MCV 111.9, MCH 38.1, Platelet 457, BUN 29, Creat. 1.59, GFR 46, Calcium 8.4, AST 12  Next Visit: 10/14/2023  Last Visit: 07/09/2023  DX: Inflammatory arthritis   Current Dose per office note 07/09/2023: sulfasalazine 500 mg 1 tablet twice daily x2 weeks if labs are stable we may consider increasing sulfasalazine to 500 mg 2 tablets BID   Attempted to contact the patient and left message for patient to call the office.   Okay to refill Sulfasalazine?

## 2023-08-05 ENCOUNTER — Other Ambulatory Visit: Payer: Self-pay | Admitting: *Deleted

## 2023-08-05 ENCOUNTER — Encounter: Payer: Self-pay | Admitting: Internal Medicine

## 2023-08-05 DIAGNOSIS — Z79899 Other long term (current) drug therapy: Secondary | ICD-10-CM

## 2023-08-06 DIAGNOSIS — L84 Corns and callosities: Secondary | ICD-10-CM | POA: Diagnosis not present

## 2023-08-06 DIAGNOSIS — L821 Other seborrheic keratosis: Secondary | ICD-10-CM | POA: Diagnosis not present

## 2023-08-06 DIAGNOSIS — X32XXXD Exposure to sunlight, subsequent encounter: Secondary | ICD-10-CM | POA: Diagnosis not present

## 2023-08-06 DIAGNOSIS — L570 Actinic keratosis: Secondary | ICD-10-CM | POA: Diagnosis not present

## 2023-08-06 NOTE — Progress Notes (Signed)
Hemoglobin is low and stable.  .  Platelets are elevated and stable.  CMP is normal.  Please add B12 and folate.  Please forward results to his PCP.

## 2023-08-07 ENCOUNTER — Other Ambulatory Visit: Payer: Self-pay | Admitting: *Deleted

## 2023-08-07 LAB — CBC WITH DIFFERENTIAL/PLATELET
Absolute Monocytes: 611 {cells}/uL (ref 200–950)
Basophils Absolute: 61 {cells}/uL (ref 0–200)
Basophils Relative: 1.1 %
Eosinophils Absolute: 149 {cells}/uL (ref 15–500)
Eosinophils Relative: 2.7 %
HCT: 33.9 % — ABNORMAL LOW (ref 38.5–50.0)
Hemoglobin: 11.6 g/dL — ABNORMAL LOW (ref 13.2–17.1)
Lymphs Abs: 1689 {cells}/uL (ref 850–3900)
MCH: 38.2 pg — ABNORMAL HIGH (ref 27.0–33.0)
MCHC: 34.2 g/dL (ref 32.0–36.0)
MCV: 111.5 fL — ABNORMAL HIGH (ref 80.0–100.0)
MPV: 9.5 fL (ref 7.5–12.5)
Monocytes Relative: 11.1 %
Neutro Abs: 2992 {cells}/uL (ref 1500–7800)
Neutrophils Relative %: 54.4 %
Platelets: 457 10*3/uL — ABNORMAL HIGH (ref 140–400)
RBC: 3.04 10*6/uL — ABNORMAL LOW (ref 4.20–5.80)
RDW: 12.1 % (ref 11.0–15.0)
Total Lymphocyte: 30.7 %
WBC: 5.5 10*3/uL (ref 3.8–10.8)

## 2023-08-07 LAB — COMPLETE METABOLIC PANEL WITH GFR
AG Ratio: 1.9 (calc) (ref 1.0–2.5)
ALT: 12 U/L (ref 9–46)
AST: 14 U/L (ref 10–35)
Albumin: 4.2 g/dL (ref 3.6–5.1)
Alkaline phosphatase (APISO): 55 U/L (ref 35–144)
BUN: 19 mg/dL (ref 7–25)
CO2: 25 mmol/L (ref 20–32)
Calcium: 9 mg/dL (ref 8.6–10.3)
Chloride: 107 mmol/L (ref 98–110)
Creat: 1.15 mg/dL (ref 0.70–1.28)
Globulin: 2.2 g/dL (ref 1.9–3.7)
Glucose, Bld: 79 mg/dL (ref 65–99)
Potassium: 4.6 mmol/L (ref 3.5–5.3)
Sodium: 141 mmol/L (ref 135–146)
Total Bilirubin: 0.4 mg/dL (ref 0.2–1.2)
Total Protein: 6.4 g/dL (ref 6.1–8.1)
eGFR: 68 mL/min/{1.73_m2} (ref >=60)

## 2023-08-07 LAB — B12 AND FOLATE PANEL
Folate: >24 ng/mL
Vitamin B-12: 583 pg/mL (ref 200–1100)

## 2023-08-07 MED ORDER — SULFASALAZINE 500 MG PO TABS: 1000.0000 mg | ORAL_TABLET | Freq: Two times a day (BID) | ORAL | 2 refills | Status: DC

## 2023-08-07 NOTE — Progress Notes (Signed)
Okay to refill sulfasalazine.

## 2023-08-07 NOTE — Telephone Encounter (Signed)
Per Junie Spencer to increase sulfasalazine to 500 mg 2 tablets by mouth twice daily. Recheck lab work in 1 month.

## 2023-08-07 NOTE — Progress Notes (Signed)
Ok to increase sulfasalazine to 500 mg 2 tablets by mouth twice daily. Recheck lab work in 1 month.

## 2023-08-07 NOTE — Telephone Encounter (Signed)
-----   Message from [facility] sent at 08/07/2023  3:27 PM EDT ----- Molli Knock to refill sulfasalazine.

## 2023-08-07 NOTE — Progress Notes (Signed)
B12 and folate are normal.

## 2023-08-13 ENCOUNTER — Telehealth: Payer: Self-pay | Admitting: Urology

## 2023-08-13 NOTE — Telephone Encounter (Signed)
Patient wife called she wants you to put in order for Bun and Creatine , he thinks his kidneys are shutting  down ? Back pain and urine is dark

## 2023-08-13 NOTE — Telephone Encounter (Signed)
Please see pt message below. Ok to order?

## 2023-08-14 ENCOUNTER — Ambulatory Visit (INDEPENDENT_AMBULATORY_CARE_PROVIDER_SITE_OTHER): Payer: Medicare Other | Admitting: Urology

## 2023-08-14 VITALS — BP 128/68 | HR 93

## 2023-08-14 DIAGNOSIS — N401 Enlarged prostate with lower urinary tract symptoms: Secondary | ICD-10-CM

## 2023-08-14 DIAGNOSIS — R3912 Poor urinary stream: Secondary | ICD-10-CM

## 2023-08-14 DIAGNOSIS — R109 Unspecified abdominal pain: Secondary | ICD-10-CM | POA: Diagnosis not present

## 2023-08-14 DIAGNOSIS — N138 Other obstructive and reflux uropathy: Secondary | ICD-10-CM | POA: Diagnosis not present

## 2023-08-14 LAB — URINALYSIS, ROUTINE W REFLEX MICROSCOPIC
Bilirubin, UA: NEGATIVE
Glucose, UA: NEGATIVE
Ketones, UA: NEGATIVE
Leukocytes,UA: NEGATIVE
Nitrite, UA: NEGATIVE
Protein,UA: NEGATIVE
RBC, UA: NEGATIVE
Specific Gravity, UA: 1.025 (ref 1.005–1.030)
Urobilinogen, Ur: 0.2 mg/dL (ref 0.2–1.0)
pH, UA: 5.5 (ref 5.0–7.5)

## 2023-08-14 MED ORDER — ALFUZOSIN HCL ER 10 MG PO TB24: 10.0000 mg | ORAL_TABLET | Freq: Every evening | ORAL | 11 refills | Status: DC

## 2023-08-14 NOTE — Telephone Encounter (Signed)
MD addressed during 10/23 office visit.

## 2023-08-14 NOTE — Progress Notes (Signed)
08/14/2023 1:56 PM   Alfred Jones [birth year] [id number]  Referring provider: Juliette Alcide, MD [address] [place],  [place] [address]  Followup BPH   HPI: Mr Bengochea is a 72yo here for followup for BPH with weak urinary stream. He is scheduled for Urolift on 11/4. He has concerns today of dark colored. He does not drink water. He denies dysuria. IPSS 20 QOL 4 on silodosin 8mg  daily. Urine stream fair. He has intermittent straining to urinate.    PMH: Past Medical History:  Diagnosis Date   Acid reflux    Anxiety    Arthritis    RHEUMATOID OR PSORIATIC ?   BCC (basal cell carcinoma of skin) 06/19/2007   left lower chest (CX35FU)   BCC (basal cell carcinoma of skin) infiltrated 03/11/2018   right nose (MOHS)   BCC (basal cell carcinoma of skin) ulcerated 07/11/2010   left cheek    Cancer (HCC)    Depression    Enlarged prostate    Hyperlipidemia    Numbness    Sleep apnea    TESTED BACK IN 2017   Superficial basal cell carcinoma (BCC) 03/11/2018   left shoulder     Surgical History: Past Surgical History:  Procedure Laterality Date   ANTERIOR CERVICAL DECOMP/DISCECTOMY FUSION N/A 09/23/2018   Procedure: Cervical Three-Four Cervical Four-Five Cervical Five-Six Anterior cervical decompression/discectomy/fusion;  Surgeon: Maeola Harman, MD;  Location: [facility] OR;  Service: Neurosurgery;  Laterality: N/A;  Cervical Three-Four Cervical Four-Five Cervical Five-Six Anterior cervical decompression/discectomy/fusion   CARDIAC CATHETERIZATION     20 YRS AGO AT [facility]   CATARACT EXTRACTION Bilateral 2023   COLONOSCOPY WITH PROPOFOL N/A 06/23/2020   One small polyp at hepatic flexure that was sessile and ablated without specimen for pathologist. Surveillance 10 years if overall health permits.   ESOPHAGOGASTRODUODENOSCOPY (EGD) WITH PROPOFOL N/A 08/13/2019   prominent Schatzki ring s/p dilation, overlying erosive reflux esophagitis, small hiatal hernia   EYE SURGERY     METAL  REMOVED FROM LEFT EYE  (IN OFFICE)   EYE SURGERY Left 10/2021   retinal tear   MALONEY DILATION N/A 08/13/2019   Procedure: MALONEY DILATION;  Surgeon: Corbin Ade, MD;  [facility];  Service: Endoscopy;  Laterality: N/A;   MOLE REMOVAL     POLYPECTOMY  06/23/2020   Procedure: POLYPECTOMY;  Surgeon: Corbin Ade, MD;  [facility];  Service: Endoscopy;;   TONSILLECTOMY     as a child    Home Medications:  Allergies as of 08/14/2023   No Known Allergies      Medication List        Accurate as of August 14, 2023  1:56 PM. If you have any questions, ask your nurse or doctor.          acetaminophen 500 MG tablet Commonly known as: TYLENOL Take 1,000 mg by mouth 2 (two) times daily.   aspirin EC 81 MG tablet Take 81 mg by mouth daily. Swallow whole.   celecoxib 200 MG capsule Commonly known as: CELEBREX Take 200 mg by mouth 2 (two) times daily.   cetirizine 10 MG tablet Commonly known as: ZYRTEC Take 2 tablets by mouth daily.   CINNAMON PO Take 1,200 mg by mouth daily.   clotrimazole 1 % cream Commonly known as: LOTRIMIN Apply 1 application topically at bedtime. Feet   Co-Enzyme Q-10 100 MG Caps Take 100 mg by mouth daily.   diclofenac Sodium 1 % Gel Commonly  08/14/2023 1:56 PM   Alfred Jones [birth year] [id number]  Referring provider: Juliette Alcide, MD [address] [place],  [place] [address]  Followup BPH   HPI: Mr Bengochea is a 72yo here for followup for BPH with weak urinary stream. He is scheduled for Urolift on 11/4. He has concerns today of dark colored. He does not drink water. He denies dysuria. IPSS 20 QOL 4 on silodosin 8mg  daily. Urine stream fair. He has intermittent straining to urinate.    PMH: Past Medical History:  Diagnosis Date   Acid reflux    Anxiety    Arthritis    RHEUMATOID OR PSORIATIC ?   BCC (basal cell carcinoma of skin) 06/19/2007   left lower chest (CX35FU)   BCC (basal cell carcinoma of skin) infiltrated 03/11/2018   right nose (MOHS)   BCC (basal cell carcinoma of skin) ulcerated 07/11/2010   left cheek    Cancer (HCC)    Depression    Enlarged prostate    Hyperlipidemia    Numbness    Sleep apnea    TESTED BACK IN 2017   Superficial basal cell carcinoma (BCC) 03/11/2018   left shoulder     Surgical History: Past Surgical History:  Procedure Laterality Date   ANTERIOR CERVICAL DECOMP/DISCECTOMY FUSION N/A 09/23/2018   Procedure: Cervical Three-Four Cervical Four-Five Cervical Five-Six Anterior cervical decompression/discectomy/fusion;  Surgeon: Maeola Harman, MD;  Location: [facility] OR;  Service: Neurosurgery;  Laterality: N/A;  Cervical Three-Four Cervical Four-Five Cervical Five-Six Anterior cervical decompression/discectomy/fusion   CARDIAC CATHETERIZATION     20 YRS AGO AT [facility]   CATARACT EXTRACTION Bilateral 2023   COLONOSCOPY WITH PROPOFOL N/A 06/23/2020   One small polyp at hepatic flexure that was sessile and ablated without specimen for pathologist. Surveillance 10 years if overall health permits.   ESOPHAGOGASTRODUODENOSCOPY (EGD) WITH PROPOFOL N/A 08/13/2019   prominent Schatzki ring s/p dilation, overlying erosive reflux esophagitis, small hiatal hernia   EYE SURGERY     METAL  REMOVED FROM LEFT EYE  (IN OFFICE)   EYE SURGERY Left 10/2021   retinal tear   MALONEY DILATION N/A 08/13/2019   Procedure: MALONEY DILATION;  Surgeon: Corbin Ade, MD;  [facility];  Service: Endoscopy;  Laterality: N/A;   MOLE REMOVAL     POLYPECTOMY  06/23/2020   Procedure: POLYPECTOMY;  Surgeon: Corbin Ade, MD;  [facility];  Service: Endoscopy;;   TONSILLECTOMY     as a child    Home Medications:  Allergies as of 08/14/2023   No Known Allergies      Medication List        Accurate as of August 14, 2023  1:56 PM. If you have any questions, ask your nurse or doctor.          acetaminophen 500 MG tablet Commonly known as: TYLENOL Take 1,000 mg by mouth 2 (two) times daily.   aspirin EC 81 MG tablet Take 81 mg by mouth daily. Swallow whole.   celecoxib 200 MG capsule Commonly known as: CELEBREX Take 200 mg by mouth 2 (two) times daily.   cetirizine 10 MG tablet Commonly known as: ZYRTEC Take 2 tablets by mouth daily.   CINNAMON PO Take 1,200 mg by mouth daily.   clotrimazole 1 % cream Commonly known as: LOTRIMIN Apply 1 application topically at bedtime. Feet   Co-Enzyme Q-10 100 MG Caps Take 100 mg by mouth daily.   diclofenac Sodium 1 % Gel Commonly  08/14/2023 1:56 PM   Alfred Jones [birth year] [id number]  Referring provider: Juliette Alcide, MD [address] [place],  [place] [address]  Followup BPH   HPI: Mr Bengochea is a 72yo here for followup for BPH with weak urinary stream. He is scheduled for Urolift on 11/4. He has concerns today of dark colored. He does not drink water. He denies dysuria. IPSS 20 QOL 4 on silodosin 8mg  daily. Urine stream fair. He has intermittent straining to urinate.    PMH: Past Medical History:  Diagnosis Date   Acid reflux    Anxiety    Arthritis    RHEUMATOID OR PSORIATIC ?   BCC (basal cell carcinoma of skin) 06/19/2007   left lower chest (CX35FU)   BCC (basal cell carcinoma of skin) infiltrated 03/11/2018   right nose (MOHS)   BCC (basal cell carcinoma of skin) ulcerated 07/11/2010   left cheek    Cancer (HCC)    Depression    Enlarged prostate    Hyperlipidemia    Numbness    Sleep apnea    TESTED BACK IN 2017   Superficial basal cell carcinoma (BCC) 03/11/2018   left shoulder     Surgical History: Past Surgical History:  Procedure Laterality Date   ANTERIOR CERVICAL DECOMP/DISCECTOMY FUSION N/A 09/23/2018   Procedure: Cervical Three-Four Cervical Four-Five Cervical Five-Six Anterior cervical decompression/discectomy/fusion;  Surgeon: Maeola Harman, MD;  Location: [facility] OR;  Service: Neurosurgery;  Laterality: N/A;  Cervical Three-Four Cervical Four-Five Cervical Five-Six Anterior cervical decompression/discectomy/fusion   CARDIAC CATHETERIZATION     20 YRS AGO AT [facility]   CATARACT EXTRACTION Bilateral 2023   COLONOSCOPY WITH PROPOFOL N/A 06/23/2020   One small polyp at hepatic flexure that was sessile and ablated without specimen for pathologist. Surveillance 10 years if overall health permits.   ESOPHAGOGASTRODUODENOSCOPY (EGD) WITH PROPOFOL N/A 08/13/2019   prominent Schatzki ring s/p dilation, overlying erosive reflux esophagitis, small hiatal hernia   EYE SURGERY     METAL  REMOVED FROM LEFT EYE  (IN OFFICE)   EYE SURGERY Left 10/2021   retinal tear   MALONEY DILATION N/A 08/13/2019   Procedure: MALONEY DILATION;  Surgeon: Corbin Ade, MD;  [facility];  Service: Endoscopy;  Laterality: N/A;   MOLE REMOVAL     POLYPECTOMY  06/23/2020   Procedure: POLYPECTOMY;  Surgeon: Corbin Ade, MD;  [facility];  Service: Endoscopy;;   TONSILLECTOMY     as a child    Home Medications:  Allergies as of 08/14/2023   No Known Allergies      Medication List        Accurate as of August 14, 2023  1:56 PM. If you have any questions, ask your nurse or doctor.          acetaminophen 500 MG tablet Commonly known as: TYLENOL Take 1,000 mg by mouth 2 (two) times daily.   aspirin EC 81 MG tablet Take 81 mg by mouth daily. Swallow whole.   celecoxib 200 MG capsule Commonly known as: CELEBREX Take 200 mg by mouth 2 (two) times daily.   cetirizine 10 MG tablet Commonly known as: ZYRTEC Take 2 tablets by mouth daily.   CINNAMON PO Take 1,200 mg by mouth daily.   clotrimazole 1 % cream Commonly known as: LOTRIMIN Apply 1 application topically at bedtime. Feet   Co-Enzyme Q-10 100 MG Caps Take 100 mg by mouth daily.   diclofenac Sodium 1 % Gel Commonly

## 2023-08-14 NOTE — H&P (View-Only) (Signed)
08/14/2023 1:56 PM   Alfred Jones [birth year] [id number]  Referring provider: Juliette Alcide, MD [address] [place],  [place] [address]  Followup BPH   HPI: Mr Bengochea is a 72yo here for followup for BPH with weak urinary stream. He is scheduled for Urolift on 11/4. He has concerns today of dark colored. He does not drink water. He denies dysuria. IPSS 20 QOL 4 on silodosin 8mg  daily. Urine stream fair. He has intermittent straining to urinate.    PMH: Past Medical History:  Diagnosis Date   Acid reflux    Anxiety    Arthritis    RHEUMATOID OR PSORIATIC ?   BCC (basal cell carcinoma of skin) 06/19/2007   left lower chest (CX35FU)   BCC (basal cell carcinoma of skin) infiltrated 03/11/2018   right nose (MOHS)   BCC (basal cell carcinoma of skin) ulcerated 07/11/2010   left cheek    Cancer (HCC)    Depression    Enlarged prostate    Hyperlipidemia    Numbness    Sleep apnea    TESTED BACK IN 2017   Superficial basal cell carcinoma (BCC) 03/11/2018   left shoulder     Surgical History: Past Surgical History:  Procedure Laterality Date   ANTERIOR CERVICAL DECOMP/DISCECTOMY FUSION N/A 09/23/2018   Procedure: Cervical Three-Four Cervical Four-Five Cervical Five-Six Anterior cervical decompression/discectomy/fusion;  Surgeon: Maeola Harman, MD;  Location: [facility] OR;  Service: Neurosurgery;  Laterality: N/A;  Cervical Three-Four Cervical Four-Five Cervical Five-Six Anterior cervical decompression/discectomy/fusion   CARDIAC CATHETERIZATION     20 YRS AGO AT [facility]   CATARACT EXTRACTION Bilateral 2023   COLONOSCOPY WITH PROPOFOL N/A 06/23/2020   One small polyp at hepatic flexure that was sessile and ablated without specimen for pathologist. Surveillance 10 years if overall health permits.   ESOPHAGOGASTRODUODENOSCOPY (EGD) WITH PROPOFOL N/A 08/13/2019   prominent Schatzki ring s/p dilation, overlying erosive reflux esophagitis, small hiatal hernia   EYE SURGERY     METAL  REMOVED FROM LEFT EYE  (IN OFFICE)   EYE SURGERY Left 10/2021   retinal tear   MALONEY DILATION N/A 08/13/2019   Procedure: MALONEY DILATION;  Surgeon: Corbin Ade, MD;  [facility];  Service: Endoscopy;  Laterality: N/A;   MOLE REMOVAL     POLYPECTOMY  06/23/2020   Procedure: POLYPECTOMY;  Surgeon: Corbin Ade, MD;  [facility];  Service: Endoscopy;;   TONSILLECTOMY     as a child    Home Medications:  Allergies as of 08/14/2023   No Known Allergies      Medication List        Accurate as of August 14, 2023  1:56 PM. If you have any questions, ask your nurse or doctor.          acetaminophen 500 MG tablet Commonly known as: TYLENOL Take 1,000 mg by mouth 2 (two) times daily.   aspirin EC 81 MG tablet Take 81 mg by mouth daily. Swallow whole.   celecoxib 200 MG capsule Commonly known as: CELEBREX Take 200 mg by mouth 2 (two) times daily.   cetirizine 10 MG tablet Commonly known as: ZYRTEC Take 2 tablets by mouth daily.   CINNAMON PO Take 1,200 mg by mouth daily.   clotrimazole 1 % cream Commonly known as: LOTRIMIN Apply 1 application topically at bedtime. Feet   Co-Enzyme Q-10 100 MG Caps Take 100 mg by mouth daily.   diclofenac Sodium 1 % Gel Commonly  08/14/2023 1:56 PM   Alfred Jones [birth year] [id number]  Referring provider: Juliette Alcide, MD [address] [place],  [place] [address]  Followup BPH   HPI: Mr Bengochea is a 72yo here for followup for BPH with weak urinary stream. He is scheduled for Urolift on 11/4. He has concerns today of dark colored. He does not drink water. He denies dysuria. IPSS 20 QOL 4 on silodosin 8mg  daily. Urine stream fair. He has intermittent straining to urinate.    PMH: Past Medical History:  Diagnosis Date   Acid reflux    Anxiety    Arthritis    RHEUMATOID OR PSORIATIC ?   BCC (basal cell carcinoma of skin) 06/19/2007   left lower chest (CX35FU)   BCC (basal cell carcinoma of skin) infiltrated 03/11/2018   right nose (MOHS)   BCC (basal cell carcinoma of skin) ulcerated 07/11/2010   left cheek    Cancer (HCC)    Depression    Enlarged prostate    Hyperlipidemia    Numbness    Sleep apnea    TESTED BACK IN 2017   Superficial basal cell carcinoma (BCC) 03/11/2018   left shoulder     Surgical History: Past Surgical History:  Procedure Laterality Date   ANTERIOR CERVICAL DECOMP/DISCECTOMY FUSION N/A 09/23/2018   Procedure: Cervical Three-Four Cervical Four-Five Cervical Five-Six Anterior cervical decompression/discectomy/fusion;  Surgeon: Maeola Harman, MD;  Location: [facility] OR;  Service: Neurosurgery;  Laterality: N/A;  Cervical Three-Four Cervical Four-Five Cervical Five-Six Anterior cervical decompression/discectomy/fusion   CARDIAC CATHETERIZATION     20 YRS AGO AT [facility]   CATARACT EXTRACTION Bilateral 2023   COLONOSCOPY WITH PROPOFOL N/A 06/23/2020   One small polyp at hepatic flexure that was sessile and ablated without specimen for pathologist. Surveillance 10 years if overall health permits.   ESOPHAGOGASTRODUODENOSCOPY (EGD) WITH PROPOFOL N/A 08/13/2019   prominent Schatzki ring s/p dilation, overlying erosive reflux esophagitis, small hiatal hernia   EYE SURGERY     METAL  REMOVED FROM LEFT EYE  (IN OFFICE)   EYE SURGERY Left 10/2021   retinal tear   MALONEY DILATION N/A 08/13/2019   Procedure: MALONEY DILATION;  Surgeon: Corbin Ade, MD;  [facility];  Service: Endoscopy;  Laterality: N/A;   MOLE REMOVAL     POLYPECTOMY  06/23/2020   Procedure: POLYPECTOMY;  Surgeon: Corbin Ade, MD;  [facility];  Service: Endoscopy;;   TONSILLECTOMY     as a child    Home Medications:  Allergies as of 08/14/2023   No Known Allergies      Medication List        Accurate as of August 14, 2023  1:56 PM. If you have any questions, ask your nurse or doctor.          acetaminophen 500 MG tablet Commonly known as: TYLENOL Take 1,000 mg by mouth 2 (two) times daily.   aspirin EC 81 MG tablet Take 81 mg by mouth daily. Swallow whole.   celecoxib 200 MG capsule Commonly known as: CELEBREX Take 200 mg by mouth 2 (two) times daily.   cetirizine 10 MG tablet Commonly known as: ZYRTEC Take 2 tablets by mouth daily.   CINNAMON PO Take 1,200 mg by mouth daily.   clotrimazole 1 % cream Commonly known as: LOTRIMIN Apply 1 application topically at bedtime. Feet   Co-Enzyme Q-10 100 MG Caps Take 100 mg by mouth daily.   diclofenac Sodium 1 % Gel Commonly  08/14/2023 1:56 PM   Alfred Jones [birth year] [id number]  Referring provider: Juliette Alcide, MD [address] [place],  [place] [address]  Followup BPH   HPI: Mr Bengochea is a 72yo here for followup for BPH with weak urinary stream. He is scheduled for Urolift on 11/4. He has concerns today of dark colored. He does not drink water. He denies dysuria. IPSS 20 QOL 4 on silodosin 8mg  daily. Urine stream fair. He has intermittent straining to urinate.    PMH: Past Medical History:  Diagnosis Date   Acid reflux    Anxiety    Arthritis    RHEUMATOID OR PSORIATIC ?   BCC (basal cell carcinoma of skin) 06/19/2007   left lower chest (CX35FU)   BCC (basal cell carcinoma of skin) infiltrated 03/11/2018   right nose (MOHS)   BCC (basal cell carcinoma of skin) ulcerated 07/11/2010   left cheek    Cancer (HCC)    Depression    Enlarged prostate    Hyperlipidemia    Numbness    Sleep apnea    TESTED BACK IN 2017   Superficial basal cell carcinoma (BCC) 03/11/2018   left shoulder     Surgical History: Past Surgical History:  Procedure Laterality Date   ANTERIOR CERVICAL DECOMP/DISCECTOMY FUSION N/A 09/23/2018   Procedure: Cervical Three-Four Cervical Four-Five Cervical Five-Six Anterior cervical decompression/discectomy/fusion;  Surgeon: Maeola Harman, MD;  Location: [facility] OR;  Service: Neurosurgery;  Laterality: N/A;  Cervical Three-Four Cervical Four-Five Cervical Five-Six Anterior cervical decompression/discectomy/fusion   CARDIAC CATHETERIZATION     20 YRS AGO AT [facility]   CATARACT EXTRACTION Bilateral 2023   COLONOSCOPY WITH PROPOFOL N/A 06/23/2020   One small polyp at hepatic flexure that was sessile and ablated without specimen for pathologist. Surveillance 10 years if overall health permits.   ESOPHAGOGASTRODUODENOSCOPY (EGD) WITH PROPOFOL N/A 08/13/2019   prominent Schatzki ring s/p dilation, overlying erosive reflux esophagitis, small hiatal hernia   EYE SURGERY     METAL  REMOVED FROM LEFT EYE  (IN OFFICE)   EYE SURGERY Left 10/2021   retinal tear   MALONEY DILATION N/A 08/13/2019   Procedure: MALONEY DILATION;  Surgeon: Corbin Ade, MD;  [facility];  Service: Endoscopy;  Laterality: N/A;   MOLE REMOVAL     POLYPECTOMY  06/23/2020   Procedure: POLYPECTOMY;  Surgeon: Corbin Ade, MD;  [facility];  Service: Endoscopy;;   TONSILLECTOMY     as a child    Home Medications:  Allergies as of 08/14/2023   No Known Allergies      Medication List        Accurate as of August 14, 2023  1:56 PM. If you have any questions, ask your nurse or doctor.          acetaminophen 500 MG tablet Commonly known as: TYLENOL Take 1,000 mg by mouth 2 (two) times daily.   aspirin EC 81 MG tablet Take 81 mg by mouth daily. Swallow whole.   celecoxib 200 MG capsule Commonly known as: CELEBREX Take 200 mg by mouth 2 (two) times daily.   cetirizine 10 MG tablet Commonly known as: ZYRTEC Take 2 tablets by mouth daily.   CINNAMON PO Take 1,200 mg by mouth daily.   clotrimazole 1 % cream Commonly known as: LOTRIMIN Apply 1 application topically at bedtime. Feet   Co-Enzyme Q-10 100 MG Caps Take 100 mg by mouth daily.   diclofenac Sodium 1 % Gel Commonly

## 2023-08-18 ENCOUNTER — Encounter: Payer: Self-pay | Admitting: Urology

## 2023-08-18 NOTE — Patient Instructions (Signed)

## 2023-08-22 ENCOUNTER — Encounter (HOSPITAL_COMMUNITY)
Admission: RE | Admit: 2023-08-22 | Discharge: 2023-08-22 | Disposition: A | Discharge status: Home / Self Care | Payer: Medicare Other | Source: Ambulatory Visit | Attending: Urology | Admitting: Urology

## 2023-08-23 ENCOUNTER — Telehealth: Payer: Self-pay

## 2023-08-23 NOTE — Telephone Encounter (Signed)
Patient needing to know if staples from Monday's (08-26-2023) surgery will work with having MRI coming up.

## 2023-08-23 NOTE — Telephone Encounter (Signed)
See below

## 2023-08-26 ENCOUNTER — Ambulatory Visit (HOSPITAL_BASED_OUTPATIENT_CLINIC_OR_DEPARTMENT_OTHER): Payer: Medicare Other | Admitting: Certified Registered"

## 2023-08-26 ENCOUNTER — Ambulatory Visit (HOSPITAL_COMMUNITY)
Admission: RE | Admit: 2023-08-26 | Discharge: 2023-08-26 | Disposition: A | Discharge status: Home / Self Care | Payer: Medicare Other | Attending: Urology | Admitting: Urology

## 2023-08-26 ENCOUNTER — Other Ambulatory Visit: Payer: Self-pay

## 2023-08-26 ENCOUNTER — Encounter (HOSPITAL_COMMUNITY): Payer: Self-pay | Admitting: Urology

## 2023-08-26 ENCOUNTER — Ambulatory Visit (HOSPITAL_COMMUNITY): Payer: Medicare Other | Admitting: Certified Registered"

## 2023-08-26 ENCOUNTER — Encounter (HOSPITAL_COMMUNITY)
Admission: RE | Disposition: A | Discharge status: Home / Self Care | Payer: Self-pay | Source: Home / Self Care | Attending: Urology

## 2023-08-26 DIAGNOSIS — Z87891 Personal history of nicotine dependence: Secondary | ICD-10-CM | POA: Insufficient documentation

## 2023-08-26 DIAGNOSIS — N401 Enlarged prostate with lower urinary tract symptoms: Secondary | ICD-10-CM | POA: Diagnosis not present

## 2023-08-26 DIAGNOSIS — Z7722 Contact with and (suspected) exposure to environmental tobacco smoke (acute) (chronic): Secondary | ICD-10-CM | POA: Diagnosis not present

## 2023-08-26 DIAGNOSIS — N138 Other obstructive and reflux uropathy: Secondary | ICD-10-CM | POA: Insufficient documentation

## 2023-08-26 DIAGNOSIS — Z85828 Personal history of other malignant neoplasm of skin: Secondary | ICD-10-CM | POA: Insufficient documentation

## 2023-08-26 DIAGNOSIS — R3916 Straining to void: Secondary | ICD-10-CM | POA: Diagnosis not present

## 2023-08-26 DIAGNOSIS — C675 Malignant neoplasm of bladder neck: Secondary | ICD-10-CM | POA: Diagnosis not present

## 2023-08-26 DIAGNOSIS — R3912 Poor urinary stream: Secondary | ICD-10-CM | POA: Diagnosis not present

## 2023-08-26 HISTORY — PX: CYSTOSCOPY WITH INSERTION OF UROLIFT: SHX6678

## 2023-08-26 SURGERY — CYSTOSCOPY WITH INSERTION OF UROLIFT
Anesthesia: General | Site: Bladder

## 2023-08-26 MED ORDER — TRAMADOL HCL 50 MG PO TABS: 50.0000 mg | ORAL_TABLET | Freq: Four times a day (QID) | ORAL | 0 refills | Status: DC | PRN

## 2023-08-26 MED ORDER — DEXAMETHASONE SODIUM PHOSPHATE 10 MG/ML IJ SOLN
INTRAMUSCULAR | Status: DC | PRN
  Administered 2023-08-26: 8 mg via INTRAVENOUS

## 2023-08-26 MED ORDER — OXYCODONE HCL 5 MG PO TABS
5.0000 mg | ORAL_TABLET | Freq: Once | ORAL | Status: AC | PRN
  Administered 2023-08-26: 5 mg via ORAL
  Filled 2023-08-26: qty 1

## 2023-08-26 MED ORDER — ORAL CARE MOUTH RINSE: 15.0000 mL | Freq: Once | OROMUCOSAL | Status: DC

## 2023-08-26 MED ORDER — ONDANSETRON HCL 4 MG/2ML IJ SOLN: 4.0000 mg | Freq: Once | INTRAMUSCULAR | Status: DC | PRN

## 2023-08-26 MED ORDER — LIDOCAINE HCL (CARDIAC) PF 100 MG/5ML IV SOSY
PREFILLED_SYRINGE | INTRAVENOUS | Status: DC | PRN
  Administered 2023-08-26: 80 mg via INTRAVENOUS

## 2023-08-26 MED ORDER — PROPOFOL 10 MG/ML IV BOLUS
INTRAVENOUS | Status: DC | PRN
  Administered 2023-08-26: 200 mg via INTRAVENOUS

## 2023-08-26 MED ORDER — LACTATED RINGERS IV SOLN: INTRAVENOUS | Status: DC | PRN

## 2023-08-26 MED ORDER — FENTANYL CITRATE PF 50 MCG/ML IJ SOSY: 25.0000 ug | PREFILLED_SYRINGE | INTRAMUSCULAR | Status: DC | PRN

## 2023-08-26 MED ORDER — FENTANYL CITRATE (PF) 100 MCG/2ML IJ SOLN
INTRAMUSCULAR | Status: AC
  Filled 2023-08-26: qty 2

## 2023-08-26 MED ORDER — DEXAMETHASONE SODIUM PHOSPHATE 10 MG/ML IJ SOLN
INTRAMUSCULAR | Status: AC
  Filled 2023-08-26: qty 1

## 2023-08-26 MED ORDER — PROPOFOL 10 MG/ML IV BOLUS
INTRAVENOUS | Status: AC
  Filled 2023-08-26: qty 20

## 2023-08-26 MED ORDER — ONDANSETRON HCL 4 MG/2ML IJ SOLN
INTRAMUSCULAR | Status: DC | PRN
  Administered 2023-08-26: 4 mg via INTRAVENOUS

## 2023-08-26 MED ORDER — CHLORHEXIDINE GLUCONATE 0.12 % MT SOLN
15.0000 mL | Freq: Once | OROMUCOSAL | Status: AC
  Administered 2023-08-26: 15 mL via OROMUCOSAL

## 2023-08-26 MED ORDER — LIDOCAINE HCL (PF) 2 % IJ SOLN
INTRAMUSCULAR | Status: AC
  Filled 2023-08-26: qty 5

## 2023-08-26 MED ORDER — OXYCODONE HCL 5 MG/5ML PO SOLN: 5.0000 mg | Freq: Once | ORAL | Status: AC | PRN

## 2023-08-26 MED ORDER — FENTANYL CITRATE (PF) 250 MCG/5ML IJ SOLN
INTRAMUSCULAR | Status: DC | PRN
  Administered 2023-08-26: 25 ug via INTRAVENOUS
  Administered 2023-08-26: 12.5 ug via INTRAVENOUS
  Administered 2023-08-26: 25 ug via INTRAVENOUS
  Administered 2023-08-26: 12.5 ug via INTRAVENOUS
  Administered 2023-08-26: 25 ug via INTRAVENOUS

## 2023-08-26 MED ORDER — CEFAZOLIN SODIUM-DEXTROSE 2-4 GM/100ML-% IV SOLN
2.0000 g | INTRAVENOUS | Status: AC
  Administered 2023-08-26: 2 g via INTRAVENOUS
  Filled 2023-08-26: qty 100

## 2023-08-26 MED ORDER — WATER FOR IRRIGATION, STERILE IR SOLN
Status: DC | PRN
  Administered 2023-08-26: 500 mL
  Administered 2023-08-26: 3000 mL

## 2023-08-26 MED ORDER — ONDANSETRON HCL 4 MG/2ML IJ SOLN
INTRAMUSCULAR | Status: AC
  Filled 2023-08-26: qty 2

## 2023-08-26 MED ORDER — LACTATED RINGERS IV SOLN: INTRAVENOUS | Status: DC

## 2023-08-26 MED ORDER — CHLORHEXIDINE GLUCONATE 0.12 % MT SOLN: 15.0000 mL | Freq: Once | OROMUCOSAL | Status: DC

## 2023-08-26 SURGICAL SUPPLY — 21 items
BAG DRAIN URO TABLE W/ADPT NS (BAG) ×2 IMPLANT
BAG DRN 8 ADPR NS SKTRN CSTL (BAG) ×1
BAG HAMPER (MISCELLANEOUS) ×1 IMPLANT
CLOTH BEACON ORANGE TIMEOUT ST (SAFETY) ×1 IMPLANT
GLOVE BIO SURGEON STRL SZ8 (GLOVE) ×1 IMPLANT
GLOVE BIOGEL PI IND STRL 7.0 (GLOVE) ×4 IMPLANT
GLOVE BIOGEL PI IND STRL 8 (GLOVE) IMPLANT
GLOVE SURG SS PI 8.0 STRL IVOR (GLOVE) IMPLANT
GOWN STRL REUS W/TWL XL LVL3 (GOWN DISPOSABLE) ×1 IMPLANT
KIT TURNOVER CYSTO (KITS) ×2 IMPLANT
MANIFOLD NEPTUNE II (INSTRUMENTS) ×1 IMPLANT
PACK CYSTO (CUSTOM PROCEDURE TRAY) ×1 IMPLANT
PAD ARMBOARD 7.5X6 YLW CONV (MISCELLANEOUS) ×1 IMPLANT
POSITIONER HEAD 8X9X4 ADT (SOFTGOODS) ×1 IMPLANT
SYSTEM UROLIFT 2 CART W/ HNDL (Male Continence) IMPLANT
SYSTEM UROLIFT 2 CARTRIDGE (Male Continence) IMPLANT
TOWEL OR 17X26 4PK STRL BLUE (TOWEL DISPOSABLE) ×2 IMPLANT
TRAY FOLEY W/BAG SLVR 16FR (SET/KITS/TRAYS/PACK) ×1
TRAY FOLEY W/BAG SLVR 16FR ST (SET/KITS/TRAYS/PACK) ×1 IMPLANT
WATER STERILE IRR 3000ML UROMA (IV SOLUTION) ×1 IMPLANT
WATER STERILE IRR 500ML POUR (IV SOLUTION) ×1 IMPLANT

## 2023-08-26 NOTE — Transfer of Care (Addendum)
Immediate Anesthesia Transfer of Care Note  Patient: Alfred Jones  Procedure(s) Performed: CYSTOSCOPY WITH INSERTION OF UROLIFT  Patient Location: PACU  Anesthesia Type:General  Level of Consciousness: drowsy and patient cooperative  Airway & Oxygen Therapy: Patient Spontanous Breathing and Patient connected to face mask oxygen  Post-op Assessment: Report given to RN and Post -op Vital signs reviewed and stable  Post vital signs: Reviewed and stable  Last Vitals:  Vitals Value Taken Time  BP 135/72 08/26/23   0810  Temp 36.5 08/26/23   0810  Pulse 75 08/26/23 0810  Resp 17 08/26/23 0810  SpO2 97 % 08/26/23 0810  Vitals shown include unfiled device data.  Last Pain:  Vitals:   08/26/23 0703  TempSrc: Oral  PainSc: 4          Complications: No notable events documented.

## 2023-08-26 NOTE — Addendum Note (Signed)
Addendum  created 08/26/23 1302 by Oletha Cruel, CRNA   Clinical Note Signed

## 2023-08-26 NOTE — Anesthesia Postprocedure Evaluation (Signed)
Anesthesia Post Note  Patient: Alfred Jones  Procedure(s) Performed: CYSTOSCOPY WITH INSERTION OF UROLIFT  Patient location during evaluation: Phase II Anesthesia Type: General Level of consciousness: awake Pain management: pain level controlled Vital Signs Assessment: post-procedure vital signs reviewed and stable Respiratory status: spontaneous breathing and respiratory function stable Cardiovascular status: blood pressure returned to baseline and stable Postop Assessment: no headache and no apparent nausea or vomiting Anesthetic complications: no Comments: Late entry   No notable events documented.   Last Vitals:  Vitals:   08/26/23 0703 08/26/23 0809  BP: 126/69 135/72  Pulse: 63 75  Resp: 16 17  Temp: 36.7 C   SpO2: 99% 97%    Last Pain:  Vitals:   08/26/23 0703  TempSrc: Oral  PainSc: 4                  Windell Norfolk

## 2023-08-26 NOTE — Anesthesia Procedure Notes (Signed)
Procedure Name: LMA Insertion Date/Time: 08/26/2023 7:36 AM  Performed by: Oletha Cruel, CRNAPre-anesthesia Checklist: Patient identified, Emergency Drugs available, Suction available and Patient being monitored Patient Re-evaluated:Patient Re-evaluated prior to induction Oxygen Delivery Method: Circle system utilized Preoxygenation: Pre-oxygenation with 100% oxygen Induction Type: IV induction Ventilation: Mask ventilation without difficulty LMA: LMA inserted Tube size: 4.0 mm Number of attempts: 1 Placement Confirmation: positive ETCO2, CO2 detector and breath sounds checked- equal and bilateral Tube secured with: Tape Dental Injury: Teeth and Oropharynx as per pre-operative assessment  Comments: Atraumatic insertion of LMA size 4. Lips and teeth remain intact.

## 2023-08-26 NOTE — Op Note (Signed)
   PREOPERATIVE DIAGNOSIS:  Benign prostatic hypertrophy with bladder outlet obstruction.  POSTOPERATIVE DIAGNOSIS:  Benign prostatic hypertrophy with bladder outlet obstruction.  PROCEDURE:  Cystoscopy with implantation of UroLift devices, 6 implants.  SURGEON:  Wilkie Aye, M.D.  ANESTHESIA:  General  ANTIBIOTICS: ancef  SPECIMEN:  None.  DRAINS:  A 16-French Foley catheter.  BLOOD LOSS:  Minimal.  COMPLICATIONS:  None.  INDICATIONS: The Patient is an 72 year old male with BPH and bladder outlet obstruction.  He has failed medical therapy and has elected UroLift for definitive treatment.  FINDINGS OF PROCEDURE:  He was taken to the operating room where a genral anesthetic was induced.  He was placed in lithotomy position and was fitted with PAS hose.  His perineum and genitalia were prepped with chlorhexidine, and he was draped in usual sterile fashion.  Cystoscopy was performed using the UroLift scope and 0 degree lens. Examination revealed a normal urethra.  The external sphincter was intact.  Prostatic urethra was approximately 5 cm in length with lateral lobe enlargement. There was also little bit of bladder neck elevation. Inspection of bladder revealed mild-to-moderate trabeculation with no tumors, stones, or inflammation.  No cellules or diverticula were noted. Ureteral orifices were in their normal anatomic position effluxing clear urine.  After initial cystoscopy, the visual obturator was replaced with the first UroLift device.  This was turned to the 9 o'clock position and pulled back to the veru and then slightly advanced.  Pressure was then applied to the right lateral lobe and the UroLift device was deployed.  The second UroLift device was then inserted and applied to the left lateral lobe at 3 o'clock and deployed in the mid prostatic urethra. After this, there was still some apparent obstruction closer to the bladder neck.  So a second and  third level of UroLift your left device was applied between the mid urethra and the proximal urethra providing further patency to the prostatic urethra.  So it was thought that a Foley catheter was indicated.  The scope was removed and a 16-French Foley catheter was inserted without difficulty. The balloon was filled with 10 mL sterile fluid, and the catheter was placed to straight drainage.  COMPLICATIONS: None   CONDITION: Stable, extubated, transferred to PACU  PLAN: The patient will be discharged home and followup in 2 days for a voiding trial.

## 2023-08-26 NOTE — Discharge Instructions (Signed)
he patient will be discharged home and followup in 2 days for a voiding trial.

## 2023-08-26 NOTE — Interval H&P Note (Signed)
History and Physical Interval Note:  08/26/2023 7:25 AM  Alfred Jones  has presented today for surgery, with the diagnosis of bengign prostatic hyperplasia.  The various methods of treatment have been discussed with the patient and family. After consideration of risks, benefits and other options for treatment, the patient has consented to  Procedure(s): CYSTOSCOPY WITH INSERTION OF UROLIFT (N/A) as a surgical intervention.  The patient's history has been reviewed, patient examined, no change in status, stable for surgery.  I have reviewed the patient's chart and labs.  Questions were answered to the patient's satisfaction.     Wilkie Aye

## 2023-08-26 NOTE — Anesthesia Preprocedure Evaluation (Signed)
Anesthesia Evaluation  Patient identified by MRN, date of birth, ID band Patient awake    Reviewed: Allergy & Precautions, H&P , NPO status , Patient's Chart, lab work & pertinent test results, reviewed documented beta blocker date and time   Airway Mallampati: II  TM Distance: >3 FB Neck ROM: full    Dental no notable dental hx.    Pulmonary neg pulmonary ROS, sleep apnea , former smoker   Pulmonary exam normal breath sounds clear to auscultation       Cardiovascular Exercise Tolerance: Good + Peripheral Vascular Disease  negative cardio ROS  Rhythm:regular Rate:Normal     Neuro/Psych  PSYCHIATRIC DISORDERS Anxiety Depression     Neuromuscular disease negative neurological ROS  negative psych ROS   GI/Hepatic negative GI ROS, Neg liver ROS,GERD  ,,  Endo/Other  negative endocrine ROS    Renal/GU negative Renal ROS  negative genitourinary   Musculoskeletal   Abdominal   Peds  Hematology negative hematology ROS (+)   Anesthesia Other Findings   Reproductive/Obstetrics negative OB ROS                             Anesthesia Physical Anesthesia Plan  ASA: 2  Anesthesia Plan: General and General LMA   Post-op Pain Management:    Induction:   PONV Risk Score and Plan: Ondansetron  Airway Management Planned:   Additional Equipment:   Intra-op Plan:   Post-operative Plan:   Informed Consent: I have reviewed the patients History and Physical, chart, labs and discussed the procedure including the risks, benefits and alternatives for the proposed anesthesia with the patient or authorized representative who has indicated his/her understanding and acceptance.     Dental Advisory Given  Plan Discussed with: CRNA  Anesthesia Plan Comments:        Anesthesia Quick Evaluation

## 2023-08-27 ENCOUNTER — Encounter (HOSPITAL_COMMUNITY): Payer: Self-pay | Admitting: Urology

## 2023-08-28 ENCOUNTER — Ambulatory Visit (INDEPENDENT_AMBULATORY_CARE_PROVIDER_SITE_OTHER): Payer: Medicare Other | Admitting: Urology

## 2023-08-28 VITALS — BP 145/80 | HR 61

## 2023-08-28 DIAGNOSIS — N138 Other obstructive and reflux uropathy: Secondary | ICD-10-CM | POA: Diagnosis not present

## 2023-08-28 DIAGNOSIS — N401 Enlarged prostate with lower urinary tract symptoms: Secondary | ICD-10-CM

## 2023-08-28 DIAGNOSIS — R3912 Poor urinary stream: Secondary | ICD-10-CM | POA: Diagnosis not present

## 2023-08-28 MED ORDER — CIPROFLOXACIN HCL 500 MG PO TABS
500.0000 mg | ORAL_TABLET | Freq: Once | ORAL | Status: AC
  Administered 2023-08-28: 500 mg via ORAL

## 2023-08-28 NOTE — Progress Notes (Addendum)
Fill and Pull Catheter Removal  Patient is present today for a catheter removal.  Patient was cleaned and prepped in a sterile fashion of sterile water/ saline was instilled into the bladder when the patient felt the urge to urinate. 10ml of water was then drained from the balloon.  A 16FR foley cath was removed from the bladder no complications were noted .  Patient as then given some time to void on their own.  Patient can void  on their own after some time.  Patient tolerated well.  Performed by: Guss Bunde, CMA  Follow up/ Additional notes: MD to see after

## 2023-08-28 NOTE — Progress Notes (Signed)
08/28/2023 9:03 AM   [name] Jun 06, 1951 [id number]  Referring provider: Juliette Alcide, MD [address] [place],  [place] [address]   Followup BPH  HPI: Mr Roddey is a 72yo here for followup for BPH. He underwent urolift 2 days ago. Voiding trial passed today. No hematuria. No pelvic pain. No other complaints today   PMH: Past Medical History:  Diagnosis Date   Acid reflux    Anxiety    Arthritis    RHEUMATOID OR PSORIATIC ?   BCC (basal cell carcinoma of skin) 06/19/2007   left lower chest (CX35FU)   BCC (basal cell carcinoma of skin) infiltrated 03/11/2018   right nose (MOHS)   BCC (basal cell carcinoma of skin) ulcerated 07/11/2010   left cheek    Cancer (HCC)    Depression    Enlarged prostate    Hyperlipidemia    Numbness    Sleep apnea    TESTED BACK IN 2017   Superficial basal cell carcinoma (BCC) 03/11/2018   left shoulder     Surgical History: Past Surgical History:  Procedure Laterality Date   ANTERIOR CERVICAL DECOMP/DISCECTOMY FUSION N/A 09/23/2018   Procedure: Cervical Three-Four Cervical Four-Five Cervical Five-Six Anterior cervical decompression/discectomy/fusion;  Surgeon: Maeola Harman, MD;  Location: [facility] OR;  Service: Neurosurgery;  Laterality: N/A;  Cervical Three-Four Cervical Four-Five Cervical Five-Six Anterior cervical decompression/discectomy/fusion   CARDIAC CATHETERIZATION     20 YRS AGO AT [facility]   CATARACT EXTRACTION Bilateral 2023   COLONOSCOPY WITH PROPOFOL N/A 06/23/2020   One small polyp at hepatic flexure that was sessile and ablated without specimen for pathologist. Surveillance 10 years if overall health permits.   CYSTOSCOPY WITH INSERTION OF UROLIFT N/A 08/26/2023   Procedure: CYSTOSCOPY WITH INSERTION OF UROLIFT;  Surgeon: Malen Gauze, MD;  Location: [facility] ORS;  Service: Urology;  Laterality: N/A;   ESOPHAGOGASTRODUODENOSCOPY (EGD) WITH PROPOFOL N/A 08/13/2019   prominent Schatzki ring s/p dilation, overlying erosive  reflux esophagitis, small hiatal hernia   EYE SURGERY     METAL REMOVED FROM LEFT EYE  (IN OFFICE)   EYE SURGERY Left 10/2021   retinal tear   MALONEY DILATION N/A 08/13/2019   Procedure: MALONEY DILATION;  Surgeon: Corbin Ade, MD;  Location: [facility]ITE;  Service: Endoscopy;  Laterality: N/A;   MOLE REMOVAL     POLYPECTOMY  06/23/2020   Procedure: POLYPECTOMY;  Surgeon: Corbin Ade, MD;  Location: [facility];  Service: Endoscopy;;   TONSILLECTOMY     as a child    Home Medications:  Allergies as of 08/28/2023   No Known Allergies      Medication List        Accurate as of August 28, 2023  9:03 AM. If you have any questions, ask your nurse or doctor.          acetaminophen 500 MG tablet Commonly known as: TYLENOL Take 1,000 mg by mouth 2 (two) times daily.   alfuzosin 10 MG 24 hr tablet Commonly known as: UROXATRAL Take 1 tablet (10 mg total) by mouth at bedtime. What changed: when to take this   aspirin EC 81 MG tablet Take 81 mg by mouth daily. Swallow whole.   B-12 PO Take 1 capsule by mouth daily.   BENGAY EX Apply 1 Application topically daily as needed (back pain).   celecoxib 200 MG capsule Commonly known as: CELEBREX Take 200 mg by mouth daily.   cetirizine 10 MG tablet Commonly known  as: ZYRTEC Take 10 mg by mouth at bedtime.   CINNAMON PO Take 1,200 mg by mouth daily.   clotrimazole 1 % cream Commonly known as: LOTRIMIN Apply 1 application topically at bedtime. Feet   Co-Enzyme Q-10 100 MG Caps Take 100 mg by mouth 2 (two) times daily.   DULoxetine 60 MG capsule Commonly known as: CYMBALTA Take 60 mg by mouth daily.   fexofenadine 180 MG tablet Commonly known as: ALLEGRA Take 180 mg by mouth daily.   Flax Seed Oil 1000 MG Caps Take 1,000 mg by mouth at bedtime.   folic acid 1 MG tablet Commonly known as: FOLVITE Take 1 mg by mouth daily.   hydroxyurea 500 MG capsule Commonly known as: HYDREA Take 500-1,000 mg  by mouth See admin instructions. Take 1000 mg by mouth on Mon, Wed, and Fri. Take 500 daily on Tues, Thurs, Sat, and Sun   ketoconazole 2 % cream Commonly known as: NIZORAL Apply 1 application  topically daily as needed for irritation.   levothyroxine 50 MCG tablet Commonly known as: SYNTHROID Take 50 mcg by mouth daily.   linaclotide 290 MCG Caps capsule Commonly known as: LINZESS Take 1 capsule (290 mcg total) by mouth daily before breakfast.   Melatonin 10 MG Caps Take 10 mg by mouth at bedtime.   OVER THE COUNTER MEDICATION Take 1 capsule by mouth daily. Super Fruits supplement   OVER THE COUNTER MEDICATION Take 1 capsule by mouth daily. Super Vegetables supplement   Oxycodone HCl 10 MG Tabs Take 10 mg by mouth 3 (three) times daily as needed (pain).   pantoprazole 40 MG tablet Commonly known as: PROTONIX Take 1 tablet by mouth 2 (two) times daily.   pregabalin 50 MG capsule Commonly known as: LYRICA Take 50 mg by mouth 2 (two) times daily.   REFRESH OP Place 1 drop into both eyes daily as needed (dry eyes).   rosuvastatin 5 MG tablet Commonly known as: CRESTOR Take 5 mg by mouth 2 (two) times a week. Sun and Wed night   senna-docusate 8.6-50 MG tablet Commonly known as: Senokot-S Take 1 tablet by mouth 2 (two) times daily.   sulfaSALAzine 500 MG tablet Commonly known as: AZULFIDINE Take 2 tablets (1,000 mg total) by mouth 2 (two) times daily.   tadalafil 5 MG tablet Commonly known as: CIALIS TAKE ONE TABLET BY MOUTH EVERY DAY   tiZANidine 4 MG tablet Commonly known as: ZANAFLEX Take 4 mg by mouth at bedtime.   traMADol 50 MG tablet Commonly known as: Ultram Take 1 tablet (50 mg total) by mouth every 6 (six) hours as needed.   traZODone 50 MG tablet Commonly known as: DESYREL Take 50 mg by mouth at bedtime.   trolamine salicylate 10 % cream Commonly known as: ASPERCREME Apply 1 Application topically at bedtime.   TURMERIC PO Take 1 tablet by  mouth at bedtime.   VITAMIN D PO Take 1 capsule by mouth daily.        Allergies: No Known Allergies  Family History: Family History  Problem Relation Age of Onset   Heart disease Mother    Diabetes Mother    Heart attack Mother    Heart disease Father    Diabetes Father    Diabetes Sister    Seizures Sister    Diabetes Brother    Heart attack Brother    Sleep apnea Brother    Heart attack Brother    Sleep apnea Brother    Heart attack  Brother    Diabetes Brother    Heart attack Brother    Hypothyroidism Daughter    Hypothyroidism Son    Colon cancer Neg Hx     Social History:  reports that he quit smoking about 36 years ago. His smoking use included cigarettes. He started smoking about 52 years ago. He has a 16 pack-year smoking history. He has been exposed to tobacco smoke. He has never used smokeless tobacco. He reports that he does not drink alcohol and does not use drugs.  ROS: All other review of systems were reviewed and are negative except what is noted above in HPI  Physical Exam: BP (!) 145/80   Pulse 61   Constitutional:  Alert and oriented, No acute distress. HEENT: [place] AT, moist mucus membranes.  Trachea midline, no masses. Cardiovascular: No clubbing, cyanosis, or edema. Respiratory: Normal respiratory effort, no increased work of breathing. GI: Abdomen is soft, nontender, nondistended, no abdominal masses GU: No CVA tenderness.  Lymph: No cervical or inguinal lymphadenopathy. Skin: No rashes, bruises or suspicious lesions. Neurologic: Grossly intact, no focal deficits, moving all 4 extremities. Psychiatric: Normal mood and affect.  Laboratory Data: Lab Results  Component Value Date   WBC 5.5 08/05/2023   HGB 11.6 (L) 08/05/2023   HCT 33.9 (L) 08/05/2023   MCV 111.5 (H) 08/05/2023   PLT 457 (H) 08/05/2023    Lab Results  Component Value Date   CREATININE 1.15 08/05/2023    Lab Results  Component Value Date   PSA 1.12 12/02/2015     No results found for: "TESTOSTERONE"  Lab Results  Component Value Date   HGBA1C 5.2 04/15/2018    Urinalysis    Component Value Date/Time   APPEARANCEUR Clear 08/14/2023 1337   GLUCOSEU Negative 08/14/2023 1337   BILIRUBINUR Negative 08/14/2023 1337   PROTEINUR Negative 08/14/2023 1337   UROBILINOGEN 1.0 01/15/2020 1121   NITRITE Negative 08/14/2023 1337   LEUKOCYTESUR Negative 08/14/2023 1337    Lab Results  Component Value Date   LABMICR Comment 08/14/2023   WBCUA 0-5 01/14/2023   LABEPIT 0-10 01/14/2023   MUCUS Present 07/29/2020   BACTERIA None seen 01/14/2023    Pertinent Imaging:  Results for orders placed during the hospital encounter of 07/11/20  DG Abd 1 View  Narrative CLINICAL DATA:  Constipation  EXAM: ABDOMEN - 1 VIEW  COMPARISON:  X-ray abdomen 04/18/2018, CT abdomen pelvis 06/03/2012  FINDINGS: The bowel gas pattern is normal. Stool within the ascending and proximal transverse colon. No radio-opaque calculi or other significant radiographic abnormality are seen. Visualized osseous structures are grossly unremarkable.  IMPRESSION: Nonobstructive bowel gas pattern with stool within the ascending colon proximal transverse colon.   Electronically Signed By: Tish Frederickson M.D. On: 07/11/2020 23:52  No results found for this or any previous visit.  No results found for this or any previous visit.  No results found for this or any previous visit.  No results found for this or any previous visit.  No valid procedures specified. No results found for this or any previous visit.  No results found for this or any previous visit.   Assessment & Plan:    1. Benign prostatic hyperplasia with urinary obstruction -followup 1 month with PVR - Bladder Voiding Trial - ciprofloxacin (CIPRO) tablet 500 mg  2. Weak urinary stream -followup 1 month with PVR  No follow-ups on file.  Wilkie Aye, MD  [facility]  [place]

## 2023-08-29 ENCOUNTER — Telehealth: Payer: Self-pay

## 2023-08-29 NOTE — Telephone Encounter (Signed)
Patient called with concerns of burning and frequency after surgery, I told him it could last a few days to weeks and to increase fluids. I advised if his symptoms worsen or he develops a fever to call our office and schedule a sooner follow up.  Patient voiced understanding and will follow up as scheduled for now.

## 2023-09-01 ENCOUNTER — Encounter: Payer: Self-pay | Admitting: Urology

## 2023-09-01 NOTE — Patient Instructions (Signed)

## 2023-09-02 ENCOUNTER — Other Ambulatory Visit: Payer: Self-pay | Admitting: *Deleted

## 2023-09-02 DIAGNOSIS — Z79899 Other long term (current) drug therapy: Secondary | ICD-10-CM

## 2023-09-03 DIAGNOSIS — Z79899 Other long term (current) drug therapy: Secondary | ICD-10-CM | POA: Diagnosis not present

## 2023-09-04 DIAGNOSIS — M5416 Radiculopathy, lumbar region: Secondary | ICD-10-CM | POA: Diagnosis not present

## 2023-09-04 LAB — COMPLETE METABOLIC PANEL WITH GFR
AG Ratio: 1.8 (calc) (ref 1.0–2.5)
ALT: 11 U/L (ref 9–46)
AST: 14 U/L (ref 10–35)
Albumin: 4.1 g/dL (ref 3.6–5.1)
Alkaline phosphatase (APISO): 58 U/L (ref 35–144)
BUN: 15 mg/dL (ref 7–25)
CO2: 27 mmol/L (ref 20–32)
Calcium: 8.7 mg/dL (ref 8.6–10.3)
Chloride: 105 mmol/L (ref 98–110)
Creat: 0.97 mg/dL (ref 0.70–1.28)
Globulin: 2.3 g/dL (ref 1.9–3.7)
Glucose, Bld: 108 mg/dL — ABNORMAL HIGH (ref 65–99)
Potassium: 4.4 mmol/L (ref 3.5–5.3)
Sodium: 139 mmol/L (ref 135–146)
Total Bilirubin: 0.5 mg/dL (ref 0.2–1.2)
Total Protein: 6.4 g/dL (ref 6.1–8.1)
eGFR: 84 mL/min/{1.73_m2} (ref >=60)

## 2023-09-04 LAB — CBC WITH DIFFERENTIAL/PLATELET
Absolute Lymphocytes: 1474 {cells}/uL (ref 850–3900)
Absolute Monocytes: 627 {cells}/uL (ref 200–950)
Basophils Absolute: 50 {cells}/uL (ref 0–200)
Basophils Relative: 0.9 %
Eosinophils Absolute: 143 {cells}/uL (ref 15–500)
Eosinophils Relative: 2.6 %
HCT: 33.6 % — ABNORMAL LOW (ref 38.5–50.0)
Hemoglobin: 11.8 g/dL — ABNORMAL LOW (ref 13.2–17.1)
MCH: 37.8 pg — ABNORMAL HIGH (ref 27.0–33.0)
MCHC: 35.1 g/dL (ref 32.0–36.0)
MCV: 107.7 fL — ABNORMAL HIGH (ref 80.0–100.0)
MPV: 9.7 fL (ref 7.5–12.5)
Monocytes Relative: 11.4 %
Neutro Abs: 3207 {cells}/uL (ref 1500–7800)
Neutrophils Relative %: 58.3 %
Platelets: 455 10*3/uL — ABNORMAL HIGH (ref 140–400)
RBC: 3.12 10*6/uL — ABNORMAL LOW (ref 4.20–5.80)
RDW: 12.1 % (ref 11.0–15.0)
Total Lymphocyte: 26.8 %
WBC: 5.5 10*3/uL (ref 3.8–10.8)

## 2023-09-04 NOTE — Progress Notes (Signed)
CMP normal, CBC shows hemoglobin 11.8 which is stable.

## 2023-09-10 DIAGNOSIS — M5416 Radiculopathy, lumbar region: Secondary | ICD-10-CM | POA: Diagnosis not present

## 2023-09-12 DIAGNOSIS — R7989 Other specified abnormal findings of blood chemistry: Secondary | ICD-10-CM | POA: Diagnosis not present

## 2023-09-12 DIAGNOSIS — D529 Folate deficiency anemia, unspecified: Secondary | ICD-10-CM | POA: Diagnosis not present

## 2023-09-12 DIAGNOSIS — D519 Vitamin B12 deficiency anemia, unspecified: Secondary | ICD-10-CM | POA: Diagnosis not present

## 2023-09-12 DIAGNOSIS — M5416 Radiculopathy, lumbar region: Secondary | ICD-10-CM | POA: Diagnosis not present

## 2023-09-12 DIAGNOSIS — M4316 Spondylolisthesis, lumbar region: Secondary | ICD-10-CM | POA: Diagnosis not present

## 2023-09-12 DIAGNOSIS — E039 Hypothyroidism, unspecified: Secondary | ICD-10-CM | POA: Diagnosis not present

## 2023-09-12 DIAGNOSIS — M48061 Spinal stenosis, lumbar region without neurogenic claudication: Secondary | ICD-10-CM | POA: Diagnosis not present

## 2023-09-12 DIAGNOSIS — D649 Anemia, unspecified: Secondary | ICD-10-CM | POA: Diagnosis not present

## 2023-09-12 DIAGNOSIS — Z6832 Body mass index (BMI) 32.0-32.9, adult: Secondary | ICD-10-CM | POA: Diagnosis not present

## 2023-09-18 DIAGNOSIS — M2550 Pain in unspecified joint: Secondary | ICD-10-CM | POA: Diagnosis not present

## 2023-09-18 DIAGNOSIS — D7589 Other specified diseases of blood and blood-forming organs: Secondary | ICD-10-CM | POA: Diagnosis not present

## 2023-09-18 DIAGNOSIS — E039 Hypothyroidism, unspecified: Secondary | ICD-10-CM | POA: Diagnosis not present

## 2023-09-18 DIAGNOSIS — Z6833 Body mass index (BMI) 33.0-33.9, adult: Secondary | ICD-10-CM | POA: Diagnosis not present

## 2023-09-18 DIAGNOSIS — M4712 Other spondylosis with myelopathy, cervical region: Secondary | ICD-10-CM | POA: Diagnosis not present

## 2023-09-18 DIAGNOSIS — E7849 Other hyperlipidemia: Secondary | ICD-10-CM | POA: Diagnosis not present

## 2023-09-18 DIAGNOSIS — D473 Essential (hemorrhagic) thrombocythemia: Secondary | ICD-10-CM | POA: Diagnosis not present

## 2023-09-18 DIAGNOSIS — N400 Enlarged prostate without lower urinary tract symptoms: Secondary | ICD-10-CM | POA: Diagnosis not present

## 2023-09-18 DIAGNOSIS — L4050 Arthropathic psoriasis, unspecified: Secondary | ICD-10-CM | POA: Diagnosis not present

## 2023-09-18 DIAGNOSIS — R030 Elevated blood-pressure reading, without diagnosis of hypertension: Secondary | ICD-10-CM | POA: Diagnosis not present

## 2023-09-18 DIAGNOSIS — G4733 Obstructive sleep apnea (adult) (pediatric): Secondary | ICD-10-CM | POA: Diagnosis not present

## 2023-09-23 ENCOUNTER — Encounter: Payer: Self-pay | Admitting: Internal Medicine

## 2023-09-23 ENCOUNTER — Ambulatory Visit (INDEPENDENT_AMBULATORY_CARE_PROVIDER_SITE_OTHER): Payer: Medicare Other | Admitting: Internal Medicine

## 2023-09-23 VITALS — BP 131/76 | HR 65 | Temp 98.1°F | Ht 70.0 in | Wt 226.4 lb

## 2023-09-23 DIAGNOSIS — K5909 Other constipation: Secondary | ICD-10-CM

## 2023-09-23 DIAGNOSIS — K5900 Constipation, unspecified: Secondary | ICD-10-CM

## 2023-09-23 DIAGNOSIS — K219 Gastro-esophageal reflux disease without esophagitis: Secondary | ICD-10-CM | POA: Diagnosis not present

## 2023-09-23 NOTE — Patient Instructions (Signed)
It was good to see you again today.  You may continue taking Protonix 40 mg daily twice daily best taken 30 minutes before breakfast and supper  Continue Linzess 290 for constipation  Scheduled for 1 more screening colonoscopy 2031 so long as overall health permits  Office visit here in 1 year

## 2023-09-23 NOTE — Progress Notes (Unsigned)
Primary Care Physician:  Juliette Alcide, MD Primary Gastroenterologist:  Dr. Jena Gauss  Pre-Procedure History & Physical: HPI:  Alfred Jones is a 72 y.o. male here for follow-up GERD and constipation.  Patient requires Protonix 40 mg twice daily for adequate control of reflux symptoms if he misses a dose he has a flare.  No dysphagia.  Constipation well-managed with Linzess 290 taken regularly on a as needed basis.  He is due for average rescreening colonoscopy 2031.  Past Medical History:  Diagnosis Date   Acid reflux    Anxiety    Arthritis    RHEUMATOID OR PSORIATIC ?   BCC (basal cell carcinoma of skin) 06/19/2007   left lower chest (CX35FU)   BCC (basal cell carcinoma of skin) infiltrated 03/11/2018   right nose (MOHS)   BCC (basal cell carcinoma of skin) ulcerated 07/11/2010   left cheek    Cancer (HCC)    Depression    Enlarged prostate    Hyperlipidemia    Numbness    Sleep apnea    TESTED BACK IN 2017   Superficial basal cell carcinoma (BCC) 03/11/2018   left shoulder     Past Surgical History:  Procedure Laterality Date   ANTERIOR CERVICAL DECOMP/DISCECTOMY FUSION N/A 09/23/2018   Procedure: Cervical Three-Four Cervical Four-Five Cervical Five-Six Anterior cervical decompression/discectomy/fusion;  Surgeon: Maeola Harman, MD;  Location: [facility] OR;  Service: Neurosurgery;  Laterality: N/A;  Cervical Three-Four Cervical Four-Five Cervical Five-Six Anterior cervical decompression/discectomy/fusion   CARDIAC CATHETERIZATION     20 YRS AGO AT [facility]   CATARACT EXTRACTION Bilateral 2023   COLONOSCOPY WITH PROPOFOL N/A 06/23/2020   One small polyp at hepatic flexure that was sessile and ablated without specimen for pathologist. Surveillance 10 years if overall health permits.   CYSTOSCOPY WITH INSERTION OF UROLIFT N/A 08/26/2023   Procedure: CYSTOSCOPY WITH INSERTION OF UROLIFT;  Surgeon: Malen Gauze, MD;  Location: [facility];  Service: Urology;  Laterality: N/A;    ESOPHAGOGASTRODUODENOSCOPY (EGD) WITH PROPOFOL N/A 08/13/2019   prominent Schatzki ring s/p dilation, overlying erosive reflux esophagitis, small hiatal hernia   EYE SURGERY     METAL REMOVED FROM LEFT EYE  (IN OFFICE)   EYE SURGERY Left 10/2021   retinal tear   MALONEY DILATION N/A 08/13/2019   Procedure: MALONEY DILATION;  Surgeon: Corbin Ade, MD;  Location: [facility];  Service: Endoscopy;  Laterality: N/A;   MOLE REMOVAL     POLYPECTOMY  06/23/2020   Procedure: POLYPECTOMY;  Surgeon: Corbin Ade, MD;  Location: [facility];  Service: Endoscopy;;   TONSILLECTOMY     as a child    Prior to Admission medications   Medication Sig Start Date End Date Taking? Authorizing Provider  acetaminophen (TYLENOL) 500 MG tablet Take 1,000 mg by mouth 2 (two) times daily.   Yes [provider]  aspirin EC 81 MG tablet Take 81 mg by mouth daily. Swallow whole.   Yes [provider]  celecoxib (CELEBREX) 200 MG capsule Take 200 mg by mouth daily. 07/21/21  Yes [provider]  cetirizine (ZYRTEC) 10 MG tablet Take 10 mg by mouth at bedtime.   Yes [provider]  CINNAMON PO Take 1,200 mg by mouth daily.   Yes [provider]  clotrimazole (LOTRIMIN) 1 % cream Apply 1 application topically at bedtime. Feet   Yes [provider]  Co-Enzyme Q-10 100 MG CAPS Take 100 mg by mouth 2 (two) times daily.  Yes [provider]  Cyanocobalamin (B-12 PO) Take 1 capsule by mouth daily.   Yes [provider]  DULoxetine (CYMBALTA) 60 MG capsule Take 60 mg by mouth daily.   Yes [provider]  fexofenadine (ALLEGRA) 180 MG tablet Take 180 mg by mouth daily.   Yes [provider]  Flaxseed, Linseed, (FLAX SEED OIL) 1000 MG CAPS Take 1,000 mg by mouth at bedtime.   Yes [provider]  folic acid (FOLVITE) 1 MG tablet Take 1 mg by mouth daily.   Yes [provider]  hydroxyurea (HYDREA) 500 MG  capsule Take 500-1,000 mg by mouth See admin instructions. Take 1000 mg by mouth on Mon, Wed, and Fri. Take 500 daily on Tues, Thurs, Sat, and Sun   Yes [provider]  ketoconazole (NIZORAL) 2 % cream Apply 1 application  topically daily as needed for irritation.   Yes [provider]  levothyroxine (SYNTHROID) 50 MCG tablet Take 50 mcg by mouth daily. 09/04/22  Yes [provider]  linaclotide Karlene Einstein) 290 MCG CAPS capsule Take 1 capsule (290 mcg total) by mouth daily before breakfast. 05/03/23  Yes Jena Tegeler, Gerrit Friends, MD  Melatonin 10 MG CAPS Take 10 mg by mouth at bedtime.    Yes [provider]  Menthol, Topical Analgesic, (BENGAY EX) Apply 1 Application topically daily as needed (back pain).   Yes [provider]  OVER THE COUNTER MEDICATION Take 1 capsule by mouth daily. Super Fruits supplement   Yes [provider]  OVER THE COUNTER MEDICATION Take 1 capsule by mouth daily. Super Vegetables supplement   Yes [provider]  Oxycodone HCl 10 MG TABS Take 10 mg by mouth 3 (three) times daily as needed (pain). 07/25/21  Yes [provider]  pantoprazole (PROTONIX) 40 MG tablet Take 1 tablet by mouth 2 (two) times daily. 01/11/21  Yes [provider]  Polyvinyl Alcohol-Povidone (REFRESH OP) Place 1 drop into both eyes daily as needed (dry eyes).   Yes [provider]  pregabalin (LYRICA) 50 MG capsule Take 50 mg by mouth 2 (two) times daily. 01/24/22  Yes [provider]  rosuvastatin (CRESTOR) 5 MG tablet Take 5 mg by mouth 2 (two) times a week. Sun and Wed night   Yes [provider]  senna-docusate (SENOKOT-S) 8.6-50 MG tablet Take 1 tablet by mouth 2 (two) times daily.   Yes [provider]  sulfaSALAzine (AZULFIDINE) 500 MG tablet Take 2 tablets (1,000 mg total) by mouth 2 (two) times daily. 08/07/23  Yes Gearldine Bienenstock, PA-C  tiZANidine (ZANAFLEX) 4 MG tablet Take 4 mg by mouth at  bedtime. 11/22/22  Yes [provider]  traMADol (ULTRAM) 50 MG tablet Take 1 tablet (50 mg total) by mouth every 6 (six) hours as needed. 08/26/23 08/25/24 Yes McKenzie, Mardene Celeste, MD  traZODone (DESYREL) 50 MG tablet Take 50 mg by mouth at bedtime.   Yes [provider]  trolamine salicylate (ASPERCREME) 10 % cream Apply 1 Application topically at bedtime.   Yes [provider]  TURMERIC PO Take 1 tablet by mouth at bedtime.   Yes [provider]  VITAMIN D PO Take 1 capsule by mouth daily.   Yes [provider]  alfuzosin (UROXATRAL) 10 MG 24 hr tablet Take 1 tablet (10 mg total) by mouth at bedtime. Patient not taking: Reported on 09/23/2023 08/14/23   Malen Gauze, MD  tadalafil (CIALIS) 5 MG tablet TAKE ONE TABLET BY  MOUTH EVERY DAY Patient not taking: Reported on 09/23/2023 09/14/22   Bjorn Pippin, MD    Allergies as of 09/23/2023   (No Known Allergies)    Family History  Problem Relation Age of Onset   Heart disease Mother    Diabetes Mother    Heart attack Mother    Heart disease Father    Diabetes Father    Diabetes Sister    Seizures Sister    Diabetes Brother    Heart attack Brother    Sleep apnea Brother    Heart attack Brother    Sleep apnea Brother    Heart attack Brother    Diabetes Brother    Heart attack Brother    Hypothyroidism Daughter    Hypothyroidism Son    Colon cancer Neg Hx     Social History   Socioeconomic History   Marital status: Married    Spouse name: Not on file   Number of children: 2   Years of education: 12   Highest education level: Not on file  Occupational History   Occupation: Curator  Tobacco Use   Smoking status: Former    Current packs/day: 0.00    Average packs/day: 1 pack/day for 16.0 years (16.0 ttl pk-yrs)    Types: Cigarettes    Start date: 10/22/1970    Quit date: 10/22/1986    Years since quitting: 36.9    Passive exposure: Past (minimal)   Smokeless tobacco: Never   Vaping Use   Vaping status: Never Used  Substance and Sexual Activity   Alcohol use: No   Drug use: No   Sexual activity: Yes  Other Topics Concern   Not on file  Social History Narrative   Lives at home with his wife and son.   Right-handed.   2-4 cups caffeine per day.   Social Determinants of Health   Financial Resource Strain: Low Risk  (08/02/2022)   Received from [facility], [facility]   Overall Financial Resource Strain (CARDIA)    Difficulty of Paying Living Expenses: Not hard at all  Food Insecurity: Patient Declined (07/03/2023)   Hunger Vital Sign    Worried About Running Out of Food in the Last Year: Patient declined    Ran Out of Food in the Last Year: Patient declined  Transportation Needs: Patient Declined (07/03/2023)   PRAPARE - Administrator, Civil Service (Medical): Patient declined    Lack of Transportation (Non-Medical): Patient declined  Physical Activity: Inactive (08/02/2022)   Received from [facility], [facility]   Exercise Vital Sign    Days of Exercise per Week: 0 days    Minutes of Exercise per Session: 0 min  Stress: No Stress Concern Present (08/02/2022)   Received from [facility], [facility] - Occupational Stress Questionnaire    Feeling of Stress : Not at all  Social Connections: Moderately Integrated (08/02/2022)   Received from [facility], [facility]   Social Connection and Isolation Panel [NHANES]    Frequency of Communication with Friends and Family: More than three times a week    Frequency of Social Gatherings with Friends and Family: Three times a week    Attends Religious Services: More than 4 times per year    Active Member of Clubs or Organizations: No    Attends Banker Meetings: Never    Marital Status: Married  Catering manager Violence:  Patient Declined (07/03/2023)   Humiliation, Afraid, Rape, and Kick questionnaire     Fear of Current or Ex-Partner: Patient declined    Emotionally Abused: Patient declined    Physically Abused: Patient declined    Sexually Abused: Patient declined    Review of Systems: See HPI, otherwise negative ROS  Physical Exam: BP 131/76 (BP Location: Left Arm, Patient Position: Sitting, Cuff Size: Large)   Pulse 65   Temp 98.1 F (36.7 C) (Oral)   Ht 5\' 10"  (1.778 m)   Wt 226 lb 6.4 oz (102.7 kg)   SpO2 96%   BMI 32.49 kg/m  General:   Alert,  Well-developed, well-nourished, pleasant and cooperative in NAD  Impression: Well-controlled on twice daily PPI.  Once daily therapy is inadequate.  No alarm symptoms.  Constipation well-managed on Linzess 290 as needed per patient.   Notice: This dictation was prepared with Dragon dictation along with smaller phrase technology. Any transcriptional errors that result from this process are unintentional and may not be corrected upon review.

## 2023-09-24 DIAGNOSIS — R29890 Loss of height: Secondary | ICD-10-CM | POA: Diagnosis not present

## 2023-09-24 DIAGNOSIS — M810 Age-related osteoporosis without current pathological fracture: Secondary | ICD-10-CM | POA: Diagnosis not present

## 2023-09-24 DIAGNOSIS — M48061 Spinal stenosis, lumbar region without neurogenic claudication: Secondary | ICD-10-CM | POA: Diagnosis not present

## 2023-09-30 NOTE — Progress Notes (Unsigned)
Office Visit Note  Patient: Alfred Jones             Date of Birth: [birth year]           MRN: [id number]             PCP: Juliette Alcide, MD Referring: Juliette Alcide, MD Visit Date: 10/14/2023 Occupation: @GUAROCC @  Subjective:  Medication monitoring   History of Present Illness: Alfred Jones is a 72 y.o. male with history of inflammatory arthritis and osteoarthritis.  Patient is currently taking sulfasalazine 500 mg 2 tablets by mouth twice daily.   CBC and CMP updated on 09/03/23.  His next lab work will be due in February and every 3 months.   Plan to initiate sulfasalazine.   Discontinue Plaquenil 200 mg 1 tablet by mouth twice daily due to inadequate response. Plaquenil was initiated in mid-May 2024. Methotrexate- inadequate response. Otezla-inadequate response. PLQ Eye Exam 03/04/2023 WNL Groat f/u 1 year   Activities of Daily Living:  Patient reports morning stiffness for *** {minute/hour:19697}.   Patient {ACTIONS;DENIES/REPORTS:21021675::"Denies"} nocturnal pain.  Difficulty dressing/grooming: {ACTIONS;DENIES/REPORTS:21021675::"Denies"} Difficulty climbing stairs: {ACTIONS;DENIES/REPORTS:21021675::"Denies"} Difficulty getting out of chair: {ACTIONS;DENIES/REPORTS:21021675::"Denies"} Difficulty using hands for taps, buttons, cutlery, and/or writing: {ACTIONS;DENIES/REPORTS:21021675::"Denies"}  No Rheumatology ROS completed.   PMFS History:  Patient Active Problem List   Diagnosis Date Noted   Cardiac chest pain 10/02/2022   Hypoglycemia 10/02/2022   History of dysarthria 10/02/2022   Urinary tract infection without hematuria 02/15/2022   Hematochezia 05/17/2020   Chemotherapy follow-up examination 01/27/2020   Essential thrombocythemia (HCC) 01/27/2020   Erythromelalgia (HCC) 01/04/2020   Psoriasis 12/13/2019   Family history of coronary artery disease in brother 04/28/2019   Impotence of organic origin 04/28/2019   Pain in joint, multiple sites  04/28/2019   Occlusion and stenosis of carotid artery 04/28/2019   Other specified disorders of male genital organs 04/28/2019   Psoriatic arthropathy (HCC) 04/28/2019   Vitamin D deficiency 04/28/2019   Pain in joint 03/25/2019   Myalgia and myositis 03/25/2019   Other, multiple, and unspecified sites, insect bite, nonvenomous 03/25/2019   Herniated cervical disc without myelopathy 09/23/2018   Cervical spinal stenosis 06/02/2018   Gait abnormality 05/01/2018   Spinal stenosis of lumbar region 05/01/2018   Chronic right-sided low back pain with right-sided sciatica 04/15/2018   Right groin pain 04/15/2018   Edema 06/04/2017   Arthropathy, multiple sites 03/15/2017   Acute upper respiratory infection 12/12/2016   Wheezing 12/12/2016   Other malaise and fatigue 11/04/2016   Paresthesia 09/18/2016   Mononeuritis 09/04/2016   Disturbance of skin sensation 09/03/2016   Pain in soft tissues of limb 09/03/2016   Other atopic dermatitis and related conditions 07/22/2016   Sleep apnea 07/17/2016   Other dyspnea and respiratory abnormality 04/08/2016   Hyperlipidemia 12/06/2015   BPH (benign prostatic hyperplasia) 12/06/2015   Depression 12/06/2015   Obesity, unspecified 12/06/2015   GERD 08/31/2010   Constipation 08/31/2010   DYSPHAGIA 08/31/2010   ABDOMINAL PAIN-MULTIPLE SITES 08/31/2010    Past Medical History:  Diagnosis Date   Acid reflux    Anxiety    Arthritis    RHEUMATOID OR PSORIATIC ?   BCC (basal cell carcinoma of skin) 06/19/2007   left lower chest (CX35FU)   BCC (basal cell carcinoma of skin) infiltrated 03/11/2018   right nose (MOHS)   BCC (basal cell carcinoma of skin) ulcerated 07/11/2010   left cheek    Cancer (HCC)    Depression  Enlarged prostate    Hyperlipidemia    Numbness    Sleep apnea    TESTED BACK IN 2017   Superficial basal cell carcinoma (BCC) 03/11/2018   left shoulder     Family History  Problem Relation Age of Onset   Heart disease  Mother    Diabetes Mother    Heart attack Mother    Heart disease Father    Diabetes Father    Diabetes Sister    Seizures Sister    Diabetes Brother    Heart attack Brother    Sleep apnea Brother    Heart attack Brother    Sleep apnea Brother    Heart attack Brother    Diabetes Brother    Heart attack Brother    Hypothyroidism Daughter    Hypothyroidism Son    Colon cancer Neg Hx    Past Surgical History:  Procedure Laterality Date   ANTERIOR CERVICAL DECOMP/DISCECTOMY FUSION N/A 09/23/2018   Procedure: Cervical Three-Four Cervical Four-Five Cervical Five-Six Anterior cervical decompression/discectomy/fusion;  Surgeon: Maeola Harman, MD;  Location: [facility] OR;  Service: Neurosurgery;  Laterality: N/A;  Cervical Three-Four Cervical Four-Five Cervical Five-Six Anterior cervical decompression/discectomy/fusion   CARDIAC CATHETERIZATION     20 YRS AGO AT [facility]   CATARACT EXTRACTION Bilateral 2023   COLONOSCOPY WITH PROPOFOL N/A 06/23/2020   One small polyp at hepatic flexure that was sessile and ablated without specimen for pathologist. Surveillance 10 years if overall health permits.   CYSTOSCOPY WITH INSERTION OF UROLIFT N/A 08/26/2023   Procedure: CYSTOSCOPY WITH INSERTION OF UROLIFT;  Surgeon: Malen Gauze, MD;  Location: [facility];  Service: Urology;  Laterality: N/A;   ESOPHAGOGASTRODUODENOSCOPY (EGD) WITH PROPOFOL N/A 08/13/2019   prominent Schatzki ring s/p dilation, overlying erosive reflux esophagitis, small hiatal hernia   EYE SURGERY     METAL REMOVED FROM LEFT EYE  (IN OFFICE)   EYE SURGERY Left 10/2021   retinal tear   MALONEY DILATION N/A 08/13/2019   Procedure: MALONEY DILATION;  Surgeon: Corbin Ade, MD;  Location[facility];  Service: Endoscopy;  Laterality: N/A;   MOLE REMOVAL     POLYPECTOMY  06/23/2020   Procedure: POLYPECTOMY;  Surgeon: Corbin Ade, MD;  Location: [facility];  Service: Endoscopy;;   TONSILLECTOMY     as a child   Social  History   Social History Narrative   Lives at home with his wife and son.   Right-handed.   2-4 cups caffeine per day.   Immunization History  Administered Date(s) Administered   Influenza Split 07/26/2015   Tdap 10/05/2002     Objective: Vital Signs: There were no vitals taken for this visit.   Physical Exam Vitals and nursing note reviewed.  Constitutional:      Appearance: He is well-developed.  HENT:     Head: Normocephalic and atraumatic.  Eyes:     Conjunctiva/sclera: Conjunctivae normal.     Pupils: Pupils are equal, round, and reactive to light.  Cardiovascular:     Rate and Rhythm: Normal rate and regular rhythm.     Heart sounds: Normal heart sounds.  Pulmonary:     Effort: Pulmonary effort is normal.     Breath sounds: Normal breath sounds.  Abdominal:     General: Bowel sounds are normal.     Palpations: Abdomen is soft.  Musculoskeletal:     Cervical back: Normal range of motion and neck supple.  Skin:    General: Skin is warm  and dry.     Capillary Refill: Capillary refill takes less than 2 seconds.  Neurological:     Mental Status: He is alert and oriented to person, place, and time.  Psychiatric:        Behavior: Behavior normal.      Musculoskeletal Exam: ***  CDAI Exam: CDAI Score: -- Patient Global: --; Provider Global: -- Swollen: --; Tender: -- Joint Exam 10/14/2023   No joint exam has been documented for this visit   There is currently no information documented on the homunculus. Go to the Rheumatology activity and complete the homunculus joint exam.  Investigation: No additional findings.  Imaging: No results found.  Recent Labs: Lab Results  Component Value Date   WBC 5.5 09/03/2023   HGB 11.8 (L) 09/03/2023   PLT 455 (H) 09/03/2023   NA 139 09/03/2023   K 4.4 09/03/2023   CL 105 09/03/2023   CO2 27 09/03/2023   GLUCOSE 108 (H) 09/03/2023   BUN 15 09/03/2023   CREATININE 0.97 09/03/2023   BILITOT 0.5 09/03/2023    ALKPHOS 56 04/15/2018   AST 14 09/03/2023   ALT 11 09/03/2023   PROT 6.4 09/03/2023   ALBUMIN 4.5 04/15/2018   CALCIUM 8.7 09/03/2023   GFRAA 78 12/08/2020    Speciality Comments: PLQ Eye Exam 03/04/2023 WNL Groat f/u 1 year  Methotrexate 2 years 09/18 -no response  Otezla 1 year no response [facility] at [facility] rheumatology for possible psoriatic arthritis  Procedures:  No procedures performed Allergies: Patient has no known allergies.   Assessment / Plan:     Visit Diagnoses: Inflammatory arthritis  High risk medication use  Primary osteoarthritis of both hands  Primary osteoarthritis of both knees  Primary osteoarthritis of both feet  DDD (degenerative disc disease), cervical  Degeneration of intervertebral disc of lumbar region without discogenic back pain or lower extremity pain  Neuropathy  Family history of psoriasis  Essential thrombocythemia (HCC)  Erythromelalgia (HCC)  History of gastroesophageal reflux (GERD)  History of hyperlipidemia  Vitamin D deficiency  Recurrent squamous cell carcinoma in situ of skin of cheek  Other insomnia  Orders: No orders of the defined types were placed in this encounter.  No orders of the defined types were placed in this encounter.   Face-to-face time spent with patient was *** minutes. Greater than 50% of time was spent in counseling and coordination of care.  Follow-Up Instructions: No follow-ups on file.   Gearldine Bienenstock, PA-C  Note - This record has been created using Dragon software.  Chart creation errors have been sought, but may not always  have been located. Such creation errors do not reflect on  the standard of medical care.

## 2023-10-01 NOTE — Progress Notes (Unsigned)
Name: Alfred Jones DOB: Jan 02, 1951 MRN: [id number]  History of Present Illness: Alfred Jones is a 72 y.o. male who presents today at [facility]. He is accompanied by his wife Alfred Jones. - GU history: 1. BPH with LUTS (weak stream, intermittent straining to void). - Was previously taking Uroxatral. - 06/14/2023: PSA was normal (2.9).  2. Erectile dysfunction. Uses Cialis PRN.  Recent history:  > 08/26/2023: Underwent cystoscopy with implantation of UroLift devices by Dr. Ronne Binning.   > 08/28/2023: Postop visit with Dr. Ronne Binning. Passed voiding trial. Adivsed follow up with PVR check in 1 month.   Today: He reports "moderate" strength urinary stream which is somewhat intermittent - feels that it was stronger just after the procedure. He denies urinary hesitancy, dysuria, gross hematuria, straining to void, or sensations of incomplete emptying. Reports some urgency and frequency - particularly when he drinks caffeine. Reports nocturia x1 and terminal dribbling.    Fall Screening: Do you usually have a device to assist in your mobility? No   Medications: Current Outpatient Medications  Medication Sig Dispense Refill   acetaminophen (TYLENOL) 500 MG tablet Take 1,000 mg by mouth 2 (two) times daily.     aspirin EC 81 MG tablet Take 81 mg by mouth daily. Swallow whole.     celecoxib (CELEBREX) 200 MG capsule Take 200 mg by mouth daily.     cetirizine (ZYRTEC) 10 MG tablet Take 10 mg by mouth at bedtime.     CINNAMON PO Take 1,200 mg by mouth daily.     clotrimazole (LOTRIMIN) 1 % cream Apply 1 application topically at bedtime. Feet     Co-Enzyme Q-10 100 MG CAPS Take 100 mg by mouth 2 (two) times daily.     Cyanocobalamin (B-12 PO) Take 1 capsule by mouth daily.     DULoxetine (CYMBALTA) 60 MG capsule Take 60 mg by mouth daily.     fexofenadine (ALLEGRA) 180 MG tablet Take 180 mg by mouth daily.     Flaxseed, Linseed, (FLAX SEED OIL) 1000 MG CAPS Take 1,000 mg by mouth  at bedtime.     folic acid (FOLVITE) 1 MG tablet Take 1 mg by mouth daily.     hydroxyurea (HYDREA) 500 MG capsule Take 500-1,000 mg by mouth See admin instructions. Take 1000 mg by mouth on Mon, Wed, and Fri. Take 500 daily on Tues, Thurs, Sat, and Sun     ketoconazole (NIZORAL) 2 % cream Apply 1 application  topically daily as needed for irritation.     levothyroxine (SYNTHROID) 50 MCG tablet Take 50 mcg by mouth daily.     linaclotide (LINZESS) 290 MCG CAPS capsule Take 1 capsule (290 mcg total) by mouth daily before breakfast. 30 capsule 11   Melatonin 10 MG CAPS Take 10 mg by mouth at bedtime.      Menthol, Topical Analgesic, (BENGAY EX) Apply 1 Application topically daily as needed (back pain).     OVER THE COUNTER MEDICATION Take 1 capsule by mouth daily. Super Fruits supplement     OVER THE COUNTER MEDICATION Take 1 capsule by mouth daily. Super Vegetables supplement     Oxycodone HCl 10 MG TABS Take 10 mg by mouth 3 (three) times daily as needed (pain).     pantoprazole (PROTONIX) 40 MG tablet Take 1 tablet by mouth 2 (two) times daily.     Polyvinyl Alcohol-Povidone (REFRESH OP) Place 1 drop into both eyes daily as needed (dry eyes).     pregabalin (  LYRICA) 50 MG capsule Take 50 mg by mouth 2 (two) times daily.     rosuvastatin (CRESTOR) 5 MG tablet Take 5 mg by mouth 2 (two) times a week. Sun and Wed night     senna-docusate (SENOKOT-S) 8.6-50 MG tablet Take 1 tablet by mouth 2 (two) times daily.     sulfaSALAzine (AZULFIDINE) 500 MG tablet Take 2 tablets (1,000 mg total) by mouth 2 (two) times daily. 120 tablet 2   tiZANidine (ZANAFLEX) 4 MG tablet Take 4 mg by mouth at bedtime.     traZODone (DESYREL) 50 MG tablet Take 50 mg by mouth at bedtime.     trolamine salicylate (ASPERCREME) 10 % cream Apply 1 Application topically at bedtime.     TURMERIC PO Take 1 tablet by mouth at bedtime.     VITAMIN D PO Take 1 capsule by mouth daily.     tadalafil (CIALIS) 5 MG tablet TAKE ONE TABLET  BY MOUTH EVERY DAY (Patient not taking: Reported on 10/03/2023) 90 tablet 3   No current facility-administered medications for this visit.    Allergies: No Known Allergies  Past Medical History:  Diagnosis Date   Acid reflux    Anxiety    Arthritis    RHEUMATOID OR PSORIATIC ?   BCC (basal cell carcinoma of skin) 06/19/2007   left lower chest (CX35FU)   BCC (basal cell carcinoma of skin) infiltrated 03/11/2018   right nose (MOHS)   BCC (basal cell carcinoma of skin) ulcerated 07/11/2010   left cheek    Cancer (HCC)    Depression    Enlarged prostate    Hyperlipidemia    Numbness    Sleep apnea    TESTED BACK IN 2017   Superficial basal cell carcinoma (BCC) 03/11/2018   left shoulder    Past Surgical History:  Procedure Laterality Date   ANTERIOR CERVICAL DECOMP/DISCECTOMY FUSION N/A 09/23/2018   Procedure: Cervical Three-Four Cervical Four-Five Cervical Five-Six Anterior cervical decompression/discectomy/fusion;  Surgeon: Maeola Harman, MD;  Location: [facility] OR;  Service: Neurosurgery;  Laterality: N/A;  Cervical Three-Four Cervical Four-Five Cervical Five-Six Anterior cervical decompression/discectomy/fusion   CARDIAC CATHETERIZATION     20 YRS AGO AT [facility]   CATARACT EXTRACTION Bilateral 2023   COLONOSCOPY WITH PROPOFOL N/A 06/23/2020   One small polyp at hepatic flexure that was sessile and ablated without specimen for pathologist. Surveillance 10 years if overall health permits.   CYSTOSCOPY WITH INSERTION OF UROLIFT N/A 08/26/2023   Procedure: CYSTOSCOPY WITH INSERTION OF UROLIFT;  Surgeon: Malen Gauze, MD;  Location: [facility] ORS;  Service: Urology;  Laterality: N/A;   ESOPHAGOGASTRODUODENOSCOPY (EGD) WITH PROPOFOL N/A 08/13/2019   prominent Schatzki ring s/p dilation, overlying erosive reflux esophagitis, small hiatal hernia   EYE SURGERY     METAL REMOVED FROM LEFT EYE  (IN OFFICE)   EYE SURGERY Left 10/2021   retinal tear   MALONEY DILATION N/A 08/13/2019    Procedure: MALONEY DILATION;  Surgeon: Corbin Ade, MD;  Location: [facility];  Service: Endoscopy;  Laterality: N/A;   MOLE REMOVAL     POLYPECTOMY  06/23/2020   Procedure: POLYPECTOMY;  Surgeon: Corbin Ade, MD;  Location: [facility];  Service: Endoscopy;;   TONSILLECTOMY     as a child   Family History  Problem Relation Age of Onset   Heart disease Mother    Diabetes Mother    Heart attack Mother    Heart disease Father    Diabetes Father  Diabetes Sister    Seizures Sister    Diabetes Brother    Heart attack Brother    Sleep apnea Brother    Heart attack Brother    Sleep apnea Brother    Heart attack Brother    Diabetes Brother    Heart attack Brother    Hypothyroidism Daughter    Hypothyroidism Son    Colon cancer Neg Hx    Social History   Socioeconomic History   Marital status: Married    Spouse name: Not on file   Number of children: 2   Years of education: 12   Highest education level: Not on file  Occupational History   Occupation: Curator  Tobacco Use   Smoking status: Former    Current packs/day: 0.00    Average packs/day: 1 pack/day for 16.0 years (16.0 ttl pk-yrs)    Types: Cigarettes    Start date: 10/22/1970    Quit date: 10/22/1986    Years since quitting: 36.9    Passive exposure: Past (minimal)   Smokeless tobacco: Never  Vaping Use   Vaping status: Never Used  Substance and Sexual Activity   Alcohol use: No   Drug use: No   Sexual activity: Yes  Other Topics Concern   Not on file  Social History Narrative   Lives at home with his wife and son.   Right-handed.   2-4 cups caffeine per day.   Social Drivers of Corporate investment banker Strain: Low Risk  (08/02/2022)   Received [facility], [facility]   Overall Financial Resource Strain (CARDIA)    Difficulty of Paying Living Expenses: Not hard at all  Food Insecurity: Patient Declined (07/03/2023)   Hunger Vital Sign    Worried About Running Out of Food  in the Last Year: Patient declined    Ran Out of Food in the Last Year: Patient declined  Transportation Needs: Patient Declined (07/03/2023)   PRAPARE - Administrator, Civil Service (Medical): Patient declined    Lack of Transportation (Non-Medical): Patient declined  Physical Activity: Inactive (08/02/2022)   Received from [facility], [facility]   Exercise Vital Sign    Days of Exercise per Week: 0 days    Minutes of Exercise per Session: 0 min  Stress: No Stress Concern Present (08/02/2022)   Received from [facility], [facility] - Occupational Stress Questionnaire    Feeling of Stress : Not at all  Social Connections: Moderately Integrated (08/02/2022)   Received from [facility], [facility]   Social Connection and Isolation Panel [NHANES]    Frequency of Communication with Friends and Family: More than three times a week    Frequency of Social Gatherings with Friends and Family: Three times a week    Attends Religious Services: More than 4 times per year    Active Member of Clubs or Organizations: No    Attends Banker Meetings: Never    Marital Status: Married  Catering manager Violence: Patient Declined (07/03/2023)   Humiliation, Afraid, Rape, and Kick questionnaire    Fear of Current or Ex-Partner: Patient declined    Emotionally Abused: Patient declined    Physically Abused: Patient declined    Sexually Abused: Patient declined    Review of Systems Constitutional: Patient denies any unintentional weight loss or change in strength lntegumentary: Patient denies any rashes or pruritus Cardiovascular: Patient denies  chest pain or syncope Respiratory: Patient denies shortness of breath Gastrointestinal: Patient denies nausea, vomiting, constipation, or diarrhea Musculoskeletal: Patient denies muscle cramps or weakness Neurologic: Patient denies convulsions or  seizures Allergic/Immunologic: Patient denies recent allergic reaction(s) Hematologic/Lymphatic: Patient denies bleeding tendencies Endocrine: Patient denies heat/cold intolerance  GU: As per HPI.  OBJECTIVE Vitals:   10/03/23 1556  BP: 130/75  Pulse: 67  Temp: 98.6 F (37 C)   There is no height or weight on file to calculate BMI.  Physical Examination Constitutional: No obvious distress; patient is non-toxic appearing  Cardiovascular: No visible lower extremity edema.  Respiratory: The patient does not have audible wheezing/stridor; respirations do not appear labored  Gastrointestinal: Abdomen non-distended Musculoskeletal: Normal ROM of UEs  Skin: No obvious rashes/open sores  Neurologic: CN 2-12 grossly intact Psychiatric: Answered questions appropriately with normal affect  Hematologic/Lymphatic/Immunologic: No obvious bruises or sites of spontaneous bleeding  Urine microscopy: negative  PVR: 24 ml  ASSESSMENT BPH with urinary obstruction - Plan: Urinalysis, Routine w reflex microscopic, BLADDER SCAN AMB NON-IMAGING  Weak urinary stream - Plan: Urinalysis, Routine w reflex microscopic, BLADDER SCAN AMB NON-IMAGING  Postop check - Plan: Urinalysis, Routine w reflex microscopic, BLADDER SCAN AMB NON-IMAGING  He is doing well overall with no evidence of significant incomplete bladder emptying; OK to continue without Alfuzosin. Advised him to avoid caffeine and try double voiding. Will plan for follow up in 3 months with Dr. Ronne Binning or sooner if needed. Pt verbalized understanding and agreement. All questions were answered.  PLAN Advised the following: 1. Avoid caffeine. 2. Double voiding. 3. Alfuzosin discontinued. 4. Return in about 3 months (around 01/01/2024) for f/u with Dr. Ronne Binning.  Orders Placed This Encounter  Procedures   Urinalysis, Routine w reflex microscopic   BLADDER SCAN AMB NON-IMAGING    It has been explained that the patient is to follow  regularly with their PCP in addition to all other providers involved in their care and to follow instructions provided by these respective offices. Patient advised to contact urology clinic if any urologic-pertaining questions, concerns, new symptoms or problems arise in the interim period.  There are no Patient Instructions on file for this visit.  Electronically signed by:  Donnita Falls, FNP   10/03/23    4:33 PM

## 2023-10-03 ENCOUNTER — Encounter: Payer: Self-pay | Admitting: Urology

## 2023-10-03 ENCOUNTER — Ambulatory Visit: Payer: Medicare Other | Admitting: Urology

## 2023-10-03 VITALS — BP 130/75 | HR 67 | Temp 98.6°F

## 2023-10-03 DIAGNOSIS — Z87438 Personal history of other diseases of male genital organs: Secondary | ICD-10-CM

## 2023-10-03 DIAGNOSIS — R3912 Poor urinary stream: Secondary | ICD-10-CM | POA: Diagnosis not present

## 2023-10-03 DIAGNOSIS — N138 Other obstructive and reflux uropathy: Secondary | ICD-10-CM

## 2023-10-03 DIAGNOSIS — Z09 Encounter for follow-up examination after completed treatment for conditions other than malignant neoplasm: Secondary | ICD-10-CM | POA: Diagnosis not present

## 2023-10-03 DIAGNOSIS — N401 Enlarged prostate with lower urinary tract symptoms: Secondary | ICD-10-CM | POA: Diagnosis not present

## 2023-10-03 LAB — BLADDER SCAN AMB NON-IMAGING: Scan Result: 24

## 2023-10-04 LAB — URINALYSIS, ROUTINE W REFLEX MICROSCOPIC
Bilirubin, UA: NEGATIVE
Glucose, UA: NEGATIVE
Nitrite, UA: NEGATIVE
RBC, UA: NEGATIVE
Specific Gravity, UA: 1.025 (ref 1.005–1.030)
Urobilinogen, Ur: 2 mg/dL — ABNORMAL HIGH (ref 0.2–1.0)
pH, UA: 6 (ref 5.0–7.5)

## 2023-10-04 LAB — MICROSCOPIC EXAMINATION
Bacteria, UA: NONE SEEN
RBC, Urine: NONE SEEN /[HPF] (ref 0–2)

## 2023-10-14 ENCOUNTER — Encounter: Payer: Self-pay | Admitting: Physician Assistant

## 2023-10-14 ENCOUNTER — Ambulatory Visit: Payer: Medicare Other | Attending: Physician Assistant | Admitting: Physician Assistant

## 2023-10-14 VITALS — BP 133/77 | HR 69 | Ht 71.0 in | Wt 224.0 lb

## 2023-10-14 DIAGNOSIS — D0439 Carcinoma in situ of skin of other parts of face: Secondary | ICD-10-CM | POA: Diagnosis not present

## 2023-10-14 DIAGNOSIS — M19072 Primary osteoarthritis, left ankle and foot: Secondary | ICD-10-CM | POA: Diagnosis not present

## 2023-10-14 DIAGNOSIS — I7381 Erythromelalgia: Secondary | ICD-10-CM

## 2023-10-14 DIAGNOSIS — Z840 Family history of diseases of the skin and subcutaneous tissue: Secondary | ICD-10-CM

## 2023-10-14 DIAGNOSIS — E559 Vitamin D deficiency, unspecified: Secondary | ICD-10-CM | POA: Diagnosis not present

## 2023-10-14 DIAGNOSIS — Z8719 Personal history of other diseases of the digestive system: Secondary | ICD-10-CM

## 2023-10-14 DIAGNOSIS — Z79899 Other long term (current) drug therapy: Secondary | ICD-10-CM

## 2023-10-14 DIAGNOSIS — M19041 Primary osteoarthritis, right hand: Secondary | ICD-10-CM | POA: Diagnosis not present

## 2023-10-14 DIAGNOSIS — M19071 Primary osteoarthritis, right ankle and foot: Secondary | ICD-10-CM | POA: Diagnosis not present

## 2023-10-14 DIAGNOSIS — M5030 Other cervical disc degeneration, unspecified cervical region: Secondary | ICD-10-CM | POA: Diagnosis not present

## 2023-10-14 DIAGNOSIS — G629 Polyneuropathy, unspecified: Secondary | ICD-10-CM | POA: Diagnosis not present

## 2023-10-14 DIAGNOSIS — M170 Bilateral primary osteoarthritis of knee: Secondary | ICD-10-CM | POA: Diagnosis not present

## 2023-10-14 DIAGNOSIS — M51369 Other intervertebral disc degeneration, lumbar region without mention of lumbar back pain or lower extremity pain: Secondary | ICD-10-CM

## 2023-10-14 DIAGNOSIS — M19042 Primary osteoarthritis, left hand: Secondary | ICD-10-CM | POA: Insufficient documentation

## 2023-10-14 DIAGNOSIS — M1990 Unspecified osteoarthritis, unspecified site: Secondary | ICD-10-CM | POA: Diagnosis not present

## 2023-10-14 DIAGNOSIS — D473 Essential (hemorrhagic) thrombocythemia: Secondary | ICD-10-CM

## 2023-10-14 DIAGNOSIS — G4709 Other insomnia: Secondary | ICD-10-CM

## 2023-10-14 DIAGNOSIS — M1380 Other specified arthritis, unspecified site: Secondary | ICD-10-CM

## 2023-10-14 DIAGNOSIS — Z8639 Personal history of other endocrine, nutritional and metabolic disease: Secondary | ICD-10-CM

## 2023-10-14 MED ORDER — SULFASALAZINE 500 MG PO TABS: 1000.0000 mg | ORAL_TABLET | Freq: Two times a day (BID) | ORAL | 2 refills | Status: DC

## 2023-10-14 NOTE — Patient Instructions (Signed)
 Standing Labs We placed an order today for your standing lab work.   Please have your standing labs drawn in mid-February and every 3 months   Please have your labs drawn 2 weeks prior to your appointment so that the provider can discuss your lab results at your appointment, if possible.  Please note that you may see your imaging and lab results in MyChart before we have reviewed them. We will contact you once all results are reviewed. Please allow our office up to 72 hours to thoroughly review all of the results before contacting the office for clarification of your results.  WALK-IN LAB HOURS  Monday through Thursday from 8:00 am -12:30 pm and 1:00 pm-5:00 pm and Friday from 8:00 am-12:00 pm.  Patients with office visits requiring labs will be seen before walk-in labs.  You may encounter longer than normal wait times. Please allow additional time. Wait times may be shorter on  Monday and Thursday afternoons.  We do not book appointments for walk-in labs. We appreciate your patience and understanding with our staff.   Labs are drawn by [facility]. Please bring your co-pay at the time of your lab draw.  You may receive a bill from [facility] for your lab work.  Please note if you are on Hydroxychloroquine and and an order has been placed for a Hydroxychloroquine level,  you will need to have it drawn 4 hours or more after your last dose.  If you wish to have your labs drawn at another location, please call the office 24 hours in advance so we can fax the orders.  The office is located at [address], [address], [place], [place] [address]   If you have any questions regarding directions or hours of operation,  please call [phone].   As a reminder, please drink plenty of water prior to coming for your lab work. Thanks!

## 2023-10-23 DIAGNOSIS — I10 Essential (primary) hypertension: Secondary | ICD-10-CM

## 2023-10-23 HISTORY — DX: Essential (primary) hypertension: I10

## 2023-10-24 DIAGNOSIS — L4050 Arthropathic psoriasis, unspecified: Secondary | ICD-10-CM | POA: Diagnosis not present

## 2023-10-24 DIAGNOSIS — D473 Essential (hemorrhagic) thrombocythemia: Secondary | ICD-10-CM | POA: Diagnosis not present

## 2023-10-31 DIAGNOSIS — D473 Essential (hemorrhagic) thrombocythemia: Secondary | ICD-10-CM | POA: Diagnosis not present

## 2023-11-01 DIAGNOSIS — M48061 Spinal stenosis, lumbar region without neurogenic claudication: Secondary | ICD-10-CM | POA: Diagnosis not present

## 2023-11-05 ENCOUNTER — Other Ambulatory Visit: Payer: Self-pay | Admitting: Neurosurgery

## 2023-12-06 ENCOUNTER — Encounter (HOSPITAL_COMMUNITY): Payer: Self-pay

## 2023-12-06 NOTE — Pre-Procedure Instructions (Signed)
Surgical Instructions   Your procedure is scheduled on December 16, 2023. Report to [facility] Main Entrance "A" at 5:30 A.M., then check in with the Admitting office. Any questions or running late day of surgery: call [phone]  Questions prior to your surgery date: call [phone], Monday-Friday, 8am-4pm. If you experience any cold or flu symptoms such as cough, fever, chills, shortness of breath, etc. between now and your scheduled surgery, please notify us at the above number.     Remember:  Do not eat or drink after midnight the night before your surgery   Take these medicines the morning of surgery with A SIP OF WATER: DULoxetine (CYMBALTA)  fexofenadine (ALLEGRA)  hydroxyurea (HYDREA)  levothyroxine (SYNTHROID)  linaclotide (LINZESS)  pantoprazole (PROTONIX)  pregabalin (LYRICA)  sulfaSALAzine (AZULFIDINE)    May take these medicines IF NEEDED: acetaminophen (TYLENOL) Oxycodone HCl   Polyvinyl Alcohol-Povidone (REFRESH OP) eye drops tiZANidine (ZANAFLEX)    Follow your surgeon's instructions on when to stop Aspirin.  If no instructions were given by your surgeon then you will need to call the office to get those instructions.     One week prior to surgery, STOP taking any Aleve, Naproxen, Ibuprofen, Motrin, Advil, Goody's, BC's, all herbal medications, fish oil, and non-prescription vitamins. This includes your medication: celecoxib (CELEBREX)                      Do NOT Smoke (Tobacco/Vaping) for 24 hours prior to your procedure.  If you use a CPAP at night, you may bring your mask/headgear for your overnight stay.   You will be asked to remove any contacts, glasses, piercing's, hearing aid's, dentures/partials prior to surgery. Please bring cases for these items if needed.    Patients discharged the day of surgery will not be allowed to drive home, and someone needs to stay with them for 24 hours.  SURGICAL WAITING ROOM VISITATION Patients may have no  more than 2 support people in the waiting area - these visitors may rotate.   Pre-op nurse will coordinate an appropriate time for 1 ADULT support person, who may not rotate, to accompany patient in pre-op.  Children under the age of 67 must have an adult with them who is not the patient and must remain in the main waiting area with an adult.  If the patient needs to stay at the hospital during part of their recovery, the visitor guidelines for inpatient rooms apply.  Please refer to the [facility] website for the visitor guidelines for any additional information.   If you received a COVID test during your pre-op visit  it is requested that you wear a mask when out in public, stay away from anyone that may not be feeling well and notify your surgeon if you develop symptoms. If you have been in contact with anyone that has tested positive in the last 10 days please notify you surgeon.      Pre-operative 5 CHG Bathing Instructions   You can play a key role in reducing the risk of infection after surgery. Your skin needs to be as free of germs as possible. You can reduce the number of germs on your skin by washing with CHG (chlorhexidine gluconate) soap before surgery. CHG is an antiseptic soap that kills germs and continues to kill germs even after washing.   DO NOT use if you have an allergy to chlorhexidine/CHG or antibacterial soaps. If your skin becomes reddened or irritated, stop using the  CHG and notify one of our RNs at [phone].   Please shower with the CHG soap starting 4 days before surgery using the following schedule:     Please keep in mind the following:  DO NOT shave, including legs and underarms, starting the day of your first shower.   You may shave your face at any point before/day of surgery.  Place clean sheets on your bed the day you start using CHG soap. Use a clean washcloth (not used since being washed) for each shower. DO NOT sleep with pets once you start  using the CHG.   CHG Shower Instructions:  Wash your face and private area with normal soap. If you choose to wash your hair, wash first with your normal shampoo.  After you use shampoo/soap, rinse your hair and body thoroughly to remove shampoo/soap residue.  Turn the water OFF and apply about 3 tablespoons (45 ml) of CHG soap to a CLEAN washcloth.  Apply CHG soap ONLY FROM YOUR NECK DOWN TO YOUR TOES (washing for 3-5 minutes)  DO NOT use CHG soap on face, private areas, open wounds, or sores.  Pay special attention to the area where your surgery is being performed.  If you are having back surgery, having someone wash your back for you may be helpful. Wait 2 minutes after CHG soap is applied, then you may rinse off the CHG soap.  Pat dry with a clean towel  Put on clean clothes/pajamas   If you choose to wear lotion, please use ONLY the CHG-compatible lotions that are listed below.  Additional instructions for the day of surgery: DO NOT APPLY any lotions, deodorants, cologne, or perfumes.   Do not bring valuables to the hospital. [facility] is not responsible for any belongings/valuables. Do not wear nail polish, gel polish, artificial nails, or any other type of covering on natural nails (fingers and toes) Do not wear jewelry or makeup Put on clean/comfortable clothes.  Please brush your teeth.  Ask your nurse before applying any prescription medications to the skin.     CHG Compatible Lotions   Aveeno Moisturizing lotion  Cetaphil Moisturizing Cream  Cetaphil Moisturizing Lotion  Clairol Herbal Essence Moisturizing Lotion, Dry Skin  Clairol Herbal Essence Moisturizing Lotion, Extra Dry Skin  Clairol Herbal Essence Moisturizing Lotion, Normal Skin  Curel Age Defying Therapeutic Moisturizing Lotion with Alpha Hydroxy  Curel Extreme Care Body Lotion  Curel Soothing Hands Moisturizing Hand Lotion  Curel Therapeutic Moisturizing Cream, Fragrance-Free  Curel Therapeutic  Moisturizing Lotion, Fragrance-Free  Curel Therapeutic Moisturizing Lotion, Original Formula  Eucerin Daily Replenishing Lotion  Eucerin Dry Skin Therapy Plus Alpha Hydroxy Crme  Eucerin Dry Skin Therapy Plus Alpha Hydroxy Lotion  Eucerin Original Crme  Eucerin Original Lotion  Eucerin Plus Crme Eucerin Plus Lotion  Eucerin TriLipid Replenishing Lotion  Keri Anti-Bacterial Hand Lotion  Keri Deep Conditioning Original Lotion Dry Skin Formula Softly Scented  Keri Deep Conditioning Original Lotion, Fragrance Free Sensitive Skin Formula  Keri Lotion Fast Absorbing Fragrance Free Sensitive Skin Formula  Keri Lotion Fast Absorbing Softly Scented Dry Skin Formula  Keri Original Lotion  Keri Skin Renewal Lotion Keri Silky Smooth Lotion  Keri Silky Smooth Sensitive Skin Lotion  Nivea Body Creamy Conditioning Oil  Nivea Body Extra Enriched Teacher, adult education Moisturizing Lotion Nivea Crme  Nivea Skin Firming Lotion  NutraDerm 30 Skin Lotion  NutraDerm Skin Lotion  NutraDerm Therapeutic Skin Cream  NutraDerm Therapeutic Skin Lotion  ProShield Protective Hand Cream  Provon moisturizing lotion  Please read over the following fact sheets that you were given.

## 2023-12-06 NOTE — Progress Notes (Signed)
 PCP - Dr Quintin Alto Cardiologist - Dr Hart Rochester Mallpeddi  Hem/Onc- Dr Theron Arista [facility]   Chest x-ray - 09/04/22 EKG - 12/09/23 Stress Test - 10/10/22 ECHO - 10/19/22 Cardiac Cath - Greater 30 yrs ago at [facility]   ICD Pacemaker/Loop - n/a  Sleep Study -  Yes (2017) CPAP - uses nightly  Diabetes - n/a  Blood Thinner Instructions:  n/a  Aspirin Instructions: Patient/Wife to call MD's office for ASA instructions for upcoming surgery.    NPO  Anesthesia review: Yes  STOP now taking any Aspirin (unless otherwise instructed by your surgeon), Aleve, Naproxen, Ibuprofen, Motrin, Advil, Goody's, BC's, all herbal medications, fish oil, and all vitamins.   Coronavirus Screening Do you have any of the following symptoms:  Cough yes/no: No Fever (>100.50F)  yes/no: No Runny nose yes/no: No Sore throat yes/no: No Difficulty breathing/shortness of breath  yes/no: No  Have you traveled in the last 14 days and where? yes/no: No  Patient/Wife Alfred Jones verbalized understanding of instructions that were given to them at the PAT appointment. Patient was also instructed that they will need to review over the PAT instructions again at home before surgery.

## 2023-12-09 ENCOUNTER — Encounter (HOSPITAL_COMMUNITY)
Admission: RE | Admit: 2023-12-09 | Discharge: 2023-12-09 | Disposition: A | Discharge status: Home / Self Care | Payer: Medicare Other | Source: Ambulatory Visit | Attending: Neurosurgery

## 2023-12-09 ENCOUNTER — Telehealth: Payer: Self-pay

## 2023-12-09 ENCOUNTER — Encounter (HOSPITAL_COMMUNITY): Payer: Self-pay

## 2023-12-09 ENCOUNTER — Other Ambulatory Visit: Payer: Self-pay

## 2023-12-09 VITALS — BP 141/63 | HR 71 | Temp 98.0°F | Resp 18 | Ht 70.0 in | Wt 228.0 lb

## 2023-12-09 DIAGNOSIS — K219 Gastro-esophageal reflux disease without esophagitis: Secondary | ICD-10-CM | POA: Diagnosis not present

## 2023-12-09 DIAGNOSIS — M1990 Unspecified osteoarthritis, unspecified site: Secondary | ICD-10-CM | POA: Diagnosis not present

## 2023-12-09 DIAGNOSIS — I119 Hypertensive heart disease without heart failure: Secondary | ICD-10-CM | POA: Insufficient documentation

## 2023-12-09 DIAGNOSIS — Z0000 Encounter for general adult medical examination without abnormal findings: Secondary | ICD-10-CM | POA: Diagnosis not present

## 2023-12-09 DIAGNOSIS — G4733 Obstructive sleep apnea (adult) (pediatric): Secondary | ICD-10-CM | POA: Insufficient documentation

## 2023-12-09 DIAGNOSIS — D473 Essential (hemorrhagic) thrombocythemia: Secondary | ICD-10-CM | POA: Insufficient documentation

## 2023-12-09 DIAGNOSIS — E785 Hyperlipidemia, unspecified: Secondary | ICD-10-CM | POA: Insufficient documentation

## 2023-12-09 DIAGNOSIS — Z79899 Other long term (current) drug therapy: Secondary | ICD-10-CM | POA: Diagnosis not present

## 2023-12-09 DIAGNOSIS — H43393 Other vitreous opacities, bilateral: Secondary | ICD-10-CM | POA: Diagnosis not present

## 2023-12-09 DIAGNOSIS — D649 Anemia, unspecified: Secondary | ICD-10-CM | POA: Diagnosis not present

## 2023-12-09 DIAGNOSIS — Z01818 Encounter for other preprocedural examination: Secondary | ICD-10-CM | POA: Diagnosis not present

## 2023-12-09 DIAGNOSIS — Z981 Arthrodesis status: Secondary | ICD-10-CM | POA: Diagnosis not present

## 2023-12-09 HISTORY — DX: Hypothyroidism, unspecified: E03.9

## 2023-12-09 HISTORY — DX: Prediabetes: R73.03

## 2023-12-09 HISTORY — DX: Other seasonal allergic rhinitis: J30.2

## 2023-12-09 HISTORY — DX: Pneumonia, unspecified organism: J18.9

## 2023-12-09 HISTORY — DX: Disease of blood and blood-forming organs, unspecified: D75.9

## 2023-12-09 LAB — CBC
HCT: 34.9 % — ABNORMAL LOW (ref 39.0–52.0)
Hemoglobin: 12.1 g/dL — ABNORMAL LOW (ref 13.0–17.0)
MCH: 38.3 pg — ABNORMAL HIGH (ref 26.0–34.0)
MCHC: 34.7 g/dL (ref 30.0–36.0)
MCV: 110.4 fL — ABNORMAL HIGH (ref 80.0–100.0)
Platelets: 481 10*3/uL — ABNORMAL HIGH (ref 150–400)
RBC: 3.16 MIL/uL — ABNORMAL LOW (ref 4.22–5.81)
RDW: 12.5 % (ref 11.5–15.5)
WBC: 6.9 10*3/uL (ref 4.0–10.5)
nRBC: 0 % (ref 0.0–0.2)

## 2023-12-09 LAB — TYPE AND SCREEN
ABO/RH(D): O POS
Antibody Screen: NEGATIVE

## 2023-12-09 LAB — BASIC METABOLIC PANEL
Anion gap: 6 (ref 5–15)
BUN: 20 mg/dL (ref 8–23)
CO2: 27 mmol/L (ref 22–32)
Calcium: 8.6 mg/dL — ABNORMAL LOW (ref 8.9–10.3)
Chloride: 107 mmol/L (ref 98–111)
Creatinine, Ser: 1.14 mg/dL (ref 0.61–1.24)
GFR, Estimated: >60 mL/min (ref >60)
Glucose, Bld: 108 mg/dL — ABNORMAL HIGH (ref 70–99)
Potassium: 4.2 mmol/L (ref 3.5–5.1)
Sodium: 140 mmol/L (ref 135–145)

## 2023-12-09 LAB — SURGICAL PCR SCREEN
MRSA, PCR: NEGATIVE
Staphylococcus aureus: NEGATIVE

## 2023-12-09 MED ORDER — TADALAFIL 5 MG PO TABS: 5.0000 mg | ORAL_TABLET | Freq: Every day | ORAL | 0 refills | Status: DC

## 2023-12-09 NOTE — Telephone Encounter (Signed)
 Patient's wife spoke to a nurse. Nurse said she was going to call the Rx in.  Has been trying to get medication filled for 3 weeks.  Pharmacy said they sent request 3 times.

## 2023-12-09 NOTE — Telephone Encounter (Signed)
 Patient was made aware Rx sent to pharmacy for 90 day supply. Patient voiced understanding.

## 2023-12-09 NOTE — Telephone Encounter (Signed)
 Patient is needing   tadalafil (CIALIS) 5 MG tablet   Refills.  Pharmacy has been waiting for [place] to give refills.

## 2023-12-10 NOTE — Anesthesia Preprocedure Evaluation (Addendum)
 Anesthesia Evaluation  Patient identified by MRN, date of birth, ID band Patient awake    Reviewed: Allergy & Precautions, NPO status , Patient's Chart, lab work & pertinent test results, reviewed documented beta blocker date and time   History of Anesthesia Complications Negative for: history of anesthetic complications  Airway Mallampati: III  TM Distance: >3 FB Neck ROM: Limited    Dental  (+) Missing,    Pulmonary sleep apnea , pneumonia, former smoker   breath sounds clear to auscultation       Cardiovascular hypertension, + Peripheral Vascular Disease  (-) Past MI, (-) Cardiac Stents and (-) CABG  Rhythm:Regular Rate:Normal     Neuro/Psych  PSYCHIATRIC DISORDERS Anxiety Depression     Neuromuscular disease    GI/Hepatic ,GERD  ,,  Endo/Other  Hypothyroidism    Renal/GU      Musculoskeletal  (+) Arthritis ,    Abdominal   Peds  Hematology  (+) Blood dyscrasia thrombocytosis   Anesthesia Other Findings   Reproductive/Obstetrics                              Anesthesia Physical Anesthesia Plan  ASA: 2  Anesthesia Plan: General   Post-op Pain Management:    Induction: Intravenous  PONV Risk Score and Plan: 2 and Ondansetron and Dexamethasone  Airway Management Planned: Oral ETT  Additional Equipment:   Intra-op Plan:   Post-operative Plan: Extubation in OR  Informed Consent: I have reviewed the patients History and Physical, chart, labs and discussed the procedure including the risks, benefits and alternatives for the proposed anesthesia with the patient or authorized representative who has indicated his/her understanding and acceptance.     Dental advisory given  Plan Discussed with: CRNA  Anesthesia Plan Comments: (PAT note by Antionette Poles, PA-C: 73 year old male with pertinent history including GERD, essential thrombocythemia (on hydroxyurea), hypothyroid, HTN,  HLD, OSA on CPAP, inflammatory arthritis and osteoarthritis.  Follows with rheumatology for history of inflammatory arthritis and osteoarthritis.  He is currently maintained on sulfasalazine, Celebrex, Cymbalta, oxycodone  He had cardiology evaluation in December 2023 for atypical chest pain.  Nuclear stress test 10/10/2022 was low risk, normal perfusion, normal LV function.  Echo showed EF 65 to 70%, normal RV function, no significant valvular abnormalities.  History of C3-6 ACDF.  Preop labs reviewed, mild anemia with hemoglobin 12.1, mild chronic thrombocythemia with platelets 41, otherwise unremarkable.  EKG 12/09/2023: Sinus rhythm with Premature atrial complexes with Abberant conduction.  Rate 73.  TTE 10/19/2022:  1. Left ventricular ejection fraction, by estimation, is 65 to 70%. The  left ventricle has normal function. The left ventricle has no regional  wall motion abnormalities. There is mild left ventricular hypertrophy.  Left ventricular diastolic parameters  are indeterminate. The average left ventricular global longitudinal strain  is -20.1 %. The global longitudinal strain is normal.   2. Right ventricular systolic function is normal. The right ventricular  size is normal.   3. The mitral valve is normal in structure. Trivial mitral valve  regurgitation. No evidence of mitral stenosis.   4. The tricuspid valve is abnormal.   5. The aortic valve is tricuspid. Aortic valve regurgitation is trivial.  No aortic stenosis is present.   Nuclear stress test 10/10/2022:   No ST deviation was noted during stress.   LV perfusion is normal. There is no evidence of ischemia. There is no evidence of infarction.   Left ventricular  function is normal.   The study is normal. The study is low risk.  )         Anesthesia Quick Evaluation

## 2023-12-10 NOTE — Progress Notes (Signed)
 Anesthesia Chart Review:  73 year old male with pertinent history including GERD, essential thrombocythemia (on hydroxyurea), hypothyroid, HTN, HLD, OSA on CPAP, inflammatory arthritis and osteoarthritis.  Follows with rheumatology for history of inflammatory arthritis and osteoarthritis.  He is currently maintained on sulfasalazine, Celebrex, Cymbalta, oxycodone  He had cardiology evaluation in December 2023 for atypical chest pain.  Nuclear stress test 10/10/2022 was low risk, normal perfusion, normal LV function.  Echo showed EF 65 to 70%, normal RV function, no significant valvular abnormalities.  History of C3-6 ACDF.  Preop labs reviewed, mild anemia with hemoglobin 12.1, mild chronic thrombocythemia with platelets 41, otherwise unremarkable.  EKG 12/09/2023: Sinus rhythm with Premature atrial complexes with Abberant conduction.  Rate 73.  TTE 10/19/2022:  1. Left ventricular ejection fraction, by estimation, is 65 to 70%. The  left ventricle has normal function. The left ventricle has no regional  wall motion abnormalities. There is mild left ventricular hypertrophy.  Left ventricular diastolic parameters  are indeterminate. The average left ventricular global longitudinal strain  is -20.1 %. The global longitudinal strain is normal.   2. Right ventricular systolic function is normal. The right ventricular  size is normal.   3. The mitral valve is normal in structure. Trivial mitral valve  regurgitation. No evidence of mitral stenosis.   4. The tricuspid valve is abnormal.   5. The aortic valve is tricuspid. Aortic valve regurgitation is trivial.  No aortic stenosis is present.   Nuclear stress test 10/10/2022:   No ST deviation was noted during stress.   LV perfusion is normal. There is no evidence of ischemia. There is no evidence of infarction.   Left ventricular function is normal.   The study is normal. The study is low risk.     [facility] Short Stay  Center/Anesthesiology Phone [phone] 12/10/2023 4:01 PM

## 2023-12-16 ENCOUNTER — Inpatient Hospital Stay (HOSPITAL_COMMUNITY): Payer: Medicare Other | Admitting: Physician Assistant

## 2023-12-16 ENCOUNTER — Other Ambulatory Visit: Payer: Self-pay

## 2023-12-16 ENCOUNTER — Inpatient Hospital Stay (HOSPITAL_COMMUNITY): Payer: Medicare Other

## 2023-12-16 ENCOUNTER — Encounter (HOSPITAL_COMMUNITY)
Admission: RE | Disposition: A | Discharge status: Home / Self Care | Payer: Self-pay | Source: Home / Self Care | Attending: Neurosurgery

## 2023-12-16 ENCOUNTER — Observation Stay (HOSPITAL_COMMUNITY)
Admission: RE | Admit: 2023-12-16 | Discharge: 2023-12-17 | Disposition: A | Discharge status: Home / Self Care | Payer: Medicare Other | Attending: Neurosurgery | Admitting: Neurosurgery

## 2023-12-16 ENCOUNTER — Encounter (HOSPITAL_COMMUNITY): Payer: Self-pay | Admitting: Neurosurgery

## 2023-12-16 ENCOUNTER — Inpatient Hospital Stay (HOSPITAL_COMMUNITY): Payer: Medicare Other | Admitting: Anesthesiology

## 2023-12-16 DIAGNOSIS — M4316 Spondylolisthesis, lumbar region: Secondary | ICD-10-CM | POA: Insufficient documentation

## 2023-12-16 DIAGNOSIS — Z9689 Presence of other specified functional implants: Secondary | ICD-10-CM | POA: Diagnosis not present

## 2023-12-16 DIAGNOSIS — E039 Hypothyroidism, unspecified: Secondary | ICD-10-CM | POA: Insufficient documentation

## 2023-12-16 DIAGNOSIS — Z85828 Personal history of other malignant neoplasm of skin: Secondary | ICD-10-CM | POA: Insufficient documentation

## 2023-12-16 DIAGNOSIS — Z7982 Long term (current) use of aspirin: Secondary | ICD-10-CM | POA: Insufficient documentation

## 2023-12-16 DIAGNOSIS — Z79899 Other long term (current) drug therapy: Secondary | ICD-10-CM | POA: Insufficient documentation

## 2023-12-16 DIAGNOSIS — I10 Essential (primary) hypertension: Secondary | ICD-10-CM | POA: Diagnosis not present

## 2023-12-16 DIAGNOSIS — M48061 Spinal stenosis, lumbar region without neurogenic claudication: Principal | ICD-10-CM | POA: Diagnosis present

## 2023-12-16 DIAGNOSIS — Z87891 Personal history of nicotine dependence: Secondary | ICD-10-CM | POA: Diagnosis not present

## 2023-12-16 DIAGNOSIS — M5116 Intervertebral disc disorders with radiculopathy, lumbar region: Secondary | ICD-10-CM | POA: Diagnosis not present

## 2023-12-16 HISTORY — PX: POSTERIOR FUSION PEDICLE SCREW PLACEMENT: SHX2186

## 2023-12-16 HISTORY — PX: LUMBAR LAMINECTOMY/DECOMPRESSION MICRODISCECTOMY: SHX5026

## 2023-12-16 SURGERY — POSTERIOR LUMBAR FUSION 1 LEVEL
Anesthesia: General | Site: Back

## 2023-12-16 MED ORDER — OXYCODONE HCL 10 MG PO TABS
10.0000 mg | ORAL_TABLET | Freq: Three times a day (TID) | ORAL | Status: DC | PRN
  Administered 2023-12-16: 10 mg via ORAL

## 2023-12-16 MED ORDER — THROMBIN 20000 UNITS EX SOLR
CUTANEOUS | Status: DC | PRN
  Administered 2023-12-16: 20 mL via TOPICAL

## 2023-12-16 MED ORDER — ONDANSETRON HCL 4 MG PO TABS: 4.0000 mg | ORAL_TABLET | Freq: Four times a day (QID) | ORAL | Status: DC | PRN

## 2023-12-16 MED ORDER — 0.9 % SODIUM CHLORIDE (POUR BTL) OPTIME
TOPICAL | Status: DC | PRN
  Administered 2023-12-16: 1000 mL

## 2023-12-16 MED ORDER — PHENOL 1.4 % MT LIQD: 1.0000 | OROMUCOSAL | Status: DC | PRN

## 2023-12-16 MED ORDER — CEFAZOLIN SODIUM-DEXTROSE 2-4 GM/100ML-% IV SOLN
2.0000 g | INTRAVENOUS | Status: AC
  Administered 2023-12-16: 2 g via INTRAVENOUS

## 2023-12-16 MED ORDER — CELECOXIB 200 MG PO CAPS
200.0000 mg | ORAL_CAPSULE | Freq: Two times a day (BID) | ORAL | Status: DC
  Administered 2023-12-16 – 2023-12-17 (×3): 200 mg via ORAL
  Filled 2023-12-16 (×3): qty 1

## 2023-12-16 MED ORDER — LIDOCAINE 2% (20 MG/ML) 5 ML SYRINGE
INTRAMUSCULAR | Status: DC | PRN
  Administered 2023-12-16: 100 mg via INTRAVENOUS

## 2023-12-16 MED ORDER — TAMSULOSIN HCL 0.4 MG PO CAPS
0.4000 mg | ORAL_CAPSULE | Freq: Every day | ORAL | Status: DC
  Administered 2023-12-16: 0.4 mg via ORAL
  Filled 2023-12-16: qty 1

## 2023-12-16 MED ORDER — FENTANYL CITRATE (PF) 100 MCG/2ML IJ SOLN
25.0000 ug | INTRAMUSCULAR | Status: DC | PRN
  Administered 2023-12-16 (×2): 25 ug via INTRAVENOUS

## 2023-12-16 MED ORDER — OXYCODONE HCL 5 MG PO TABS
10.0000 mg | ORAL_TABLET | ORAL | Status: DC | PRN
  Administered 2023-12-16 (×2): 10 mg via ORAL
  Filled 2023-12-16 (×2): qty 2

## 2023-12-16 MED ORDER — FENTANYL CITRATE (PF) 250 MCG/5ML IJ SOLN
INTRAMUSCULAR | Status: DC | PRN
  Administered 2023-12-16 (×2): 100 ug via INTRAVENOUS

## 2023-12-16 MED ORDER — CHLORHEXIDINE GLUCONATE CLOTH 2 % EX PADS
6.0000 | MEDICATED_PAD | Freq: Once | CUTANEOUS | Status: DC
Start: 2023-12-16 — End: 2023-12-16

## 2023-12-16 MED ORDER — KETAMINE HCL 10 MG/ML IJ SOLN
INTRAMUSCULAR | Status: DC | PRN
Start: 2023-12-16 — End: 2023-12-16
  Administered 2023-12-16: 30 mg via INTRAVENOUS
  Administered 2023-12-16 (×2): 10 mg via INTRAVENOUS

## 2023-12-16 MED ORDER — HYDROMORPHONE HCL 1 MG/ML IJ SOLN
INTRAMUSCULAR | Status: AC
Start: 2023-12-16 — End: ?
  Filled 2023-12-16: qty 0.5

## 2023-12-16 MED ORDER — SODIUM CHLORIDE 0.9 % IV SOLN: INTRAVENOUS | Status: DC | PRN

## 2023-12-16 MED ORDER — CINNAMON 500 MG PO CAPS: 1200.0000 mg | ORAL_CAPSULE | Freq: Every day | ORAL | Status: DC

## 2023-12-16 MED ORDER — ALBUMIN HUMAN 5 % IV SOLN: INTRAVENOUS | Status: DC | PRN

## 2023-12-16 MED ORDER — LORATADINE 10 MG PO TABS
10.0000 mg | ORAL_TABLET | Freq: Every day | ORAL | Status: DC
  Administered 2023-12-16: 10 mg via ORAL
  Filled 2023-12-16: qty 1

## 2023-12-16 MED ORDER — FENTANYL CITRATE (PF) 250 MCG/5ML IJ SOLN
INTRAMUSCULAR | Status: AC
  Filled 2023-12-16: qty 5

## 2023-12-16 MED ORDER — ROCURONIUM BROMIDE 10 MG/ML (PF) SYRINGE
PREFILLED_SYRINGE | INTRAVENOUS | Status: DC | PRN
  Administered 2023-12-16: 120 mg via INTRAVENOUS
  Administered 2023-12-16: 30 mg via INTRAVENOUS

## 2023-12-16 MED ORDER — LIDOCAINE-EPINEPHRINE 1 %-1:100000 IJ SOLN
INTRAMUSCULAR | Status: AC
  Filled 2023-12-16: qty 1

## 2023-12-16 MED ORDER — BUPIVACAINE LIPOSOME 1.3 % IJ SUSP
INTRAMUSCULAR | Status: AC
  Filled 2023-12-16: qty 20

## 2023-12-16 MED ORDER — ONDANSETRON HCL 4 MG/2ML IJ SOLN
INTRAMUSCULAR | Status: DC | PRN
  Administered 2023-12-16: 4 mg via INTRAVENOUS

## 2023-12-16 MED ORDER — ONDANSETRON HCL 4 MG/2ML IJ SOLN: 4.0000 mg | Freq: Once | INTRAMUSCULAR | Status: DC | PRN

## 2023-12-16 MED ORDER — OXYCODONE HCL 5 MG/5ML PO SOLN: 5.0000 mg | Freq: Once | ORAL | Status: DC | PRN

## 2023-12-16 MED ORDER — HYDROMORPHONE HCL 1 MG/ML IJ SOLN: 0.5000 mg | INTRAMUSCULAR | Status: DC | PRN

## 2023-12-16 MED ORDER — SODIUM CHLORIDE 0.9% FLUSH: 3.0000 mL | INTRAVENOUS | Status: DC | PRN

## 2023-12-16 MED ORDER — MIDAZOLAM HCL 2 MG/2ML IJ SOLN
INTRAMUSCULAR | Status: DC | PRN
  Administered 2023-12-16: 2 mg via INTRAVENOUS

## 2023-12-16 MED ORDER — LEVOTHYROXINE SODIUM 50 MCG PO TABS
50.0000 ug | ORAL_TABLET | Freq: Every day | ORAL | Status: DC
  Administered 2023-12-17: 50 ug via ORAL
  Filled 2023-12-16: qty 1
  Filled 2023-12-16: qty 2

## 2023-12-16 MED ORDER — ROSUVASTATIN CALCIUM 5 MG PO TABS: 5.0000 mg | ORAL_TABLET | ORAL | Status: DC

## 2023-12-16 MED ORDER — SUGAMMADEX SODIUM 200 MG/2ML IV SOLN
INTRAVENOUS | Status: DC | PRN
  Administered 2023-12-16: 200 mg via INTRAVENOUS

## 2023-12-16 MED ORDER — CO-ENZYME Q-10 100 MG PO CAPS: 100.0000 mg | ORAL_CAPSULE | Freq: Two times a day (BID) | ORAL | Status: DC

## 2023-12-16 MED ORDER — LINACLOTIDE 145 MCG PO CAPS: 290.0000 ug | ORAL_CAPSULE | Freq: Every day | ORAL | Status: DC

## 2023-12-16 MED ORDER — OXYCODONE HCL 5 MG PO TABS
ORAL_TABLET | ORAL | Status: AC
  Filled 2023-12-16: qty 2

## 2023-12-16 MED ORDER — BUPIVACAINE HCL (PF) 0.25 % IJ SOLN
INTRAMUSCULAR | Status: AC
  Filled 2023-12-16: qty 30

## 2023-12-16 MED ORDER — BUPIVACAINE LIPOSOME 1.3 % IJ SUSP
INTRAMUSCULAR | Status: DC | PRN
  Administered 2023-12-16: 20 mL

## 2023-12-16 MED ORDER — ASPIRIN 81 MG PO TBEC: 81.0000 mg | DELAYED_RELEASE_TABLET | Freq: Every day | ORAL | Status: DC

## 2023-12-16 MED ORDER — HYDROXYUREA 500 MG PO CAPS: 500.0000 mg | ORAL_CAPSULE | ORAL | Status: DC

## 2023-12-16 MED ORDER — PREGABALIN 25 MG PO CAPS
50.0000 mg | ORAL_CAPSULE | Freq: Two times a day (BID) | ORAL | Status: DC
  Administered 2023-12-16 (×2): 50 mg via ORAL
  Filled 2023-12-16 (×2): qty 2

## 2023-12-16 MED ORDER — OXYCODONE HCL 5 MG PO TABS: 5.0000 mg | ORAL_TABLET | Freq: Once | ORAL | Status: DC | PRN

## 2023-12-16 MED ORDER — LACTATED RINGERS IV SOLN: INTRAVENOUS | Status: DC

## 2023-12-16 MED ORDER — TRAZODONE HCL 50 MG PO TABS
50.0000 mg | ORAL_TABLET | Freq: Every day | ORAL | Status: DC
  Administered 2023-12-16: 50 mg via ORAL
  Filled 2023-12-16: qty 1

## 2023-12-16 MED ORDER — KETAMINE HCL 50 MG/5ML IJ SOSY
PREFILLED_SYRINGE | INTRAMUSCULAR | Status: AC
  Filled 2023-12-16: qty 5

## 2023-12-16 MED ORDER — ACETAMINOPHEN 500 MG PO TABS: 1000.0000 mg | ORAL_TABLET | Freq: Three times a day (TID) | ORAL | Status: DC | PRN

## 2023-12-16 MED ORDER — LORATADINE 10 MG PO TABS: 10.0000 mg | ORAL_TABLET | Freq: Every day | ORAL | Status: DC

## 2023-12-16 MED ORDER — SODIUM CHLORIDE 0.9% FLUSH
3.0000 mL | Freq: Two times a day (BID) | INTRAVENOUS | Status: DC
  Administered 2023-12-16: 3 mL via INTRAVENOUS

## 2023-12-16 MED ORDER — VITAMIN B-12 1000 MCG PO TABS
500.0000 ug | ORAL_TABLET | Freq: Every day | ORAL | Status: DC
  Administered 2023-12-16: 500 ug via ORAL
  Filled 2023-12-16: qty 1

## 2023-12-16 MED ORDER — ACETAMINOPHEN 10 MG/ML IV SOLN: 1000.0000 mg | Freq: Once | INTRAVENOUS | Status: DC | PRN

## 2023-12-16 MED ORDER — PHENYLEPHRINE HCL-NACL 20-0.9 MG/250ML-% IV SOLN
INTRAVENOUS | Status: DC | PRN
  Administered 2023-12-16: 25 ug/min via INTRAVENOUS

## 2023-12-16 MED ORDER — CEFAZOLIN SODIUM-DEXTROSE 2-4 GM/100ML-% IV SOLN
2.0000 g | Freq: Three times a day (TID) | INTRAVENOUS | Status: DC
  Administered 2023-12-16 (×2): 2 g via INTRAVENOUS
  Filled 2023-12-16 (×2): qty 100

## 2023-12-16 MED ORDER — SENNOSIDES-DOCUSATE SODIUM 8.6-50 MG PO TABS
1.0000 | ORAL_TABLET | Freq: Two times a day (BID) | ORAL | Status: DC
  Administered 2023-12-16 (×2): 1 via ORAL
  Filled 2023-12-16 (×2): qty 1

## 2023-12-16 MED ORDER — FENTANYL CITRATE (PF) 100 MCG/2ML IJ SOLN
INTRAMUSCULAR | Status: AC
  Filled 2023-12-16: qty 2

## 2023-12-16 MED ORDER — PANTOPRAZOLE SODIUM 40 MG IV SOLR: 40.0000 mg | Freq: Every day | INTRAVENOUS | Status: DC

## 2023-12-16 MED ORDER — TURMERIC 500 MG PO CAPS: ORAL_CAPSULE | Freq: Every day | ORAL | Status: DC

## 2023-12-16 MED ORDER — HYDROMORPHONE HCL 1 MG/ML IJ SOLN
INTRAMUSCULAR | Status: DC | PRN
  Administered 2023-12-16: .25 mg via INTRAVENOUS

## 2023-12-16 MED ORDER — LIDOCAINE-EPINEPHRINE 1 %-1:100000 IJ SOLN
INTRAMUSCULAR | Status: DC | PRN
  Administered 2023-12-16: 10 mL

## 2023-12-16 MED ORDER — ONDANSETRON HCL 4 MG/2ML IJ SOLN: 4.0000 mg | Freq: Four times a day (QID) | INTRAMUSCULAR | Status: DC | PRN

## 2023-12-16 MED ORDER — SODIUM CHLORIDE 0.9 % IV SOLN
250.0000 mL | INTRAVENOUS | Status: DC
Start: 2023-12-16 — End: 2023-12-17
  Administered 2023-12-16: 250 mL via INTRAVENOUS

## 2023-12-16 MED ORDER — PROPOFOL 10 MG/ML IV BOLUS
INTRAVENOUS | Status: AC
  Filled 2023-12-16: qty 20

## 2023-12-16 MED ORDER — CHLORHEXIDINE GLUCONATE 0.12 % MT SOLN
OROMUCOSAL | Status: AC
Start: 2023-12-16 — End: 2023-12-16
  Administered 2023-12-16: 15 mL via OROMUCOSAL
  Filled 2023-12-16: qty 15

## 2023-12-16 MED ORDER — ACETAMINOPHEN 10 MG/ML IV SOLN
INTRAVENOUS | Status: AC
  Administered 2023-12-16: 1000 mg via INTRAVENOUS
  Filled 2023-12-16: qty 100

## 2023-12-16 MED ORDER — ORAL CARE MOUTH RINSE: 15.0000 mL | Freq: Once | OROMUCOSAL | Status: AC

## 2023-12-16 MED ORDER — HYDROXYUREA 500 MG PO CAPS
1000.0000 mg | ORAL_CAPSULE | ORAL | Status: DC
Start: 2023-12-18 — End: 2023-12-16

## 2023-12-16 MED ORDER — CHLORHEXIDINE GLUCONATE 0.12 % MT SOLN: 15.0000 mL | Freq: Once | OROMUCOSAL | Status: AC

## 2023-12-16 MED ORDER — ACETAMINOPHEN 650 MG RE SUPP: 650.0000 mg | RECTAL | Status: DC | PRN

## 2023-12-16 MED ORDER — DEXAMETHASONE SODIUM PHOSPHATE 10 MG/ML IJ SOLN
INTRAMUSCULAR | Status: DC | PRN
  Administered 2023-12-16: 10 mg via INTRAVENOUS

## 2023-12-16 MED ORDER — DULOXETINE HCL 60 MG PO CPEP
60.0000 mg | ORAL_CAPSULE | Freq: Every day | ORAL | Status: DC
  Administered 2023-12-16: 60 mg via ORAL
  Filled 2023-12-16: qty 1

## 2023-12-16 MED ORDER — MIDAZOLAM HCL 2 MG/2ML IJ SOLN
INTRAMUSCULAR | Status: AC
Start: 2023-12-16 — End: ?
  Filled 2023-12-16: qty 2

## 2023-12-16 MED ORDER — CHLORHEXIDINE GLUCONATE CLOTH 2 % EX PADS: 6.0000 | MEDICATED_PAD | Freq: Once | CUTANEOUS | Status: DC

## 2023-12-16 MED ORDER — LISINOPRIL 20 MG PO TABS
20.0000 mg | ORAL_TABLET | Freq: Every day | ORAL | Status: DC
  Administered 2023-12-16: 20 mg via ORAL
  Filled 2023-12-16: qty 1

## 2023-12-16 MED ORDER — PROPOFOL 10 MG/ML IV BOLUS
INTRAVENOUS | Status: DC | PRN
  Administered 2023-12-16: 130 mg via INTRAVENOUS

## 2023-12-16 MED ORDER — ACETAMINOPHEN 325 MG PO TABS: 650.0000 mg | ORAL_TABLET | ORAL | Status: DC | PRN

## 2023-12-16 MED ORDER — METHOCARBAMOL 500 MG PO TABS
1000.0000 mg | ORAL_TABLET | ORAL | Status: AC
  Administered 2023-12-16: 1000 mg via ORAL
  Filled 2023-12-16: qty 2

## 2023-12-16 MED ORDER — FOLIC ACID 1 MG PO TABS
1.0000 mg | ORAL_TABLET | Freq: Every day | ORAL | Status: DC
  Administered 2023-12-16: 1 mg via ORAL
  Filled 2023-12-16: qty 1

## 2023-12-16 MED ORDER — PANTOPRAZOLE SODIUM 40 MG PO TBEC
40.0000 mg | DELAYED_RELEASE_TABLET | Freq: Two times a day (BID) | ORAL | Status: DC
  Administered 2023-12-16 – 2023-12-17 (×3): 40 mg via ORAL
  Filled 2023-12-16 (×3): qty 1

## 2023-12-16 MED ORDER — HYDROXYUREA 500 MG PO CAPS
500.0000 mg | ORAL_CAPSULE | ORAL | Status: DC
  Filled 2023-12-16: qty 1

## 2023-12-16 MED ORDER — SULFASALAZINE 500 MG PO TABS
1000.0000 mg | ORAL_TABLET | Freq: Two times a day (BID) | ORAL | Status: DC
  Administered 2023-12-16: 1000 mg via ORAL
  Filled 2023-12-16 (×2): qty 2

## 2023-12-16 MED ORDER — HYDROXYUREA 500 MG PO CAPS
1000.0000 mg | ORAL_CAPSULE | ORAL | Status: DC
Start: 2023-12-16 — End: 2023-12-17
  Administered 2023-12-16: 1000 mg via ORAL
  Filled 2023-12-16: qty 2

## 2023-12-16 MED ORDER — FLAX SEED OIL 1000 MG PO CAPS: 1000.0000 mg | ORAL_CAPSULE | Freq: Every day | ORAL | Status: DC

## 2023-12-16 MED ORDER — TIZANIDINE HCL 4 MG PO TABS
4.0000 mg | ORAL_TABLET | Freq: Every evening | ORAL | Status: DC | PRN
  Filled 2023-12-16: qty 1

## 2023-12-16 MED ORDER — MENTHOL 3 MG MT LOZG: 1.0000 | LOZENGE | OROMUCOSAL | Status: DC | PRN

## 2023-12-16 MED ORDER — CEFAZOLIN SODIUM-DEXTROSE 2-4 GM/100ML-% IV SOLN
INTRAVENOUS | Status: AC
  Filled 2023-12-16: qty 100

## 2023-12-16 MED ORDER — THROMBIN 20000 UNITS EX SOLR
CUTANEOUS | Status: AC
  Filled 2023-12-16: qty 20000

## 2023-12-16 MED ORDER — MELATONIN 5 MG PO TABS
10.0000 mg | ORAL_TABLET | Freq: Every day | ORAL | Status: DC
  Administered 2023-12-16: 10 mg via ORAL
  Filled 2023-12-16 (×2): qty 2

## 2023-12-16 MED ORDER — ALUM & MAG HYDROXIDE-SIMETH 200-200-20 MG/5ML PO SUSP: 30.0000 mL | Freq: Four times a day (QID) | ORAL | Status: DC | PRN

## 2023-12-16 SURGICAL SUPPLY — 68 items
BAG COUNTER SPONGE SURGICOUNT (BAG) ×6 IMPLANT
BAND RUBBER #18 3X1/16 STRL (MISCELLANEOUS) ×4 IMPLANT
BASKET BONE COLLECTION (BASKET) ×3 IMPLANT
BENZOIN TINCTURE PRP APPL 2/3 (GAUZE/BANDAGES/DRESSINGS) ×6 IMPLANT
BLADE BONE MILL MEDIUM (MISCELLANEOUS) ×3 IMPLANT
BLADE CLIPPER SURG (BLADE) IMPLANT
BLADE SURG 11 STRL SS (BLADE) ×6 IMPLANT
BONE VIVIGEN FORMABLE 5.4CC (Bone Implant) ×2 IMPLANT
BUR CUTTER 7.0 ROUND (BURR) ×4 IMPLANT
BUR MATCHSTICK NEURO 3.0 LAGG (BURR) ×4 IMPLANT
CAGE ALTERA 10X26 9-13 8D (Cage) IMPLANT
CANISTER SUCT 3000ML PPV (MISCELLANEOUS) ×4 IMPLANT
CAP RELINE MOD TULIP RMM (Cap) IMPLANT
CNTNR URN SCR LID CUP LEK RST (MISCELLANEOUS) ×2 IMPLANT
COVER BACK TABLE 60X90IN (DRAPES) ×2 IMPLANT
DERMABOND ADVANCED .7 DNX12 (GAUZE/BANDAGES/DRESSINGS) ×6 IMPLANT
DRAPE C-ARM 42X72 X-RAY (DRAPES) ×4 IMPLANT
DRAPE C-ARMOR (DRAPES) IMPLANT
DRAPE HALF SHEET 40X57 (DRAPES) IMPLANT
DRAPE LAPAROTOMY 100X72X124 (DRAPES) ×4 IMPLANT
DRAPE MICROSCOPE SLANT 54X150 (MISCELLANEOUS) ×2 IMPLANT
DRAPE SURG 17X23 STRL (DRAPES) ×4 IMPLANT
DRSG OPSITE 4X5.5 SM (GAUZE/BANDAGES/DRESSINGS) ×2 IMPLANT
DRSG OPSITE POSTOP 4X6 (GAUZE/BANDAGES/DRESSINGS) ×2 IMPLANT
DURAPREP 26ML APPLICATOR (WOUND CARE) ×4 IMPLANT
ELECT REM PT RETURN 9FT ADLT (ELECTROSURGICAL) ×2 IMPLANT
ELECTRODE REM PT RTRN 9FT ADLT (ELECTROSURGICAL) ×6 IMPLANT
EVACUATOR 1/8 PVC DRAIN (DRAIN) IMPLANT
GAUZE 4X4 16PLY ~~LOC~~+RFID DBL (SPONGE) IMPLANT
GAUZE SPONGE 4X4 12PLY STRL (GAUZE/BANDAGES/DRESSINGS) ×4 IMPLANT
GLOVE BIO SURGEON STRL SZ7 (GLOVE) IMPLANT
GLOVE BIO SURGEON STRL SZ8 (GLOVE) ×9 IMPLANT
GLOVE BIOGEL PI IND STRL 7.0 (GLOVE) IMPLANT
GLOVE EXAM NITRILE XL STR (GLOVE) IMPLANT
GLOVE INDICATOR 8.5 STRL (GLOVE) ×12 IMPLANT
GOWN STRL REUS W/ TWL LRG LVL3 (GOWN DISPOSABLE) ×2 IMPLANT
GOWN STRL REUS W/ TWL XL LVL3 (GOWN DISPOSABLE) ×8 IMPLANT
GOWN STRL REUS W/TWL 2XL LVL3 (GOWN DISPOSABLE) IMPLANT
GRAFT BNE MATRIX VG FRMBL MD 5 (Bone Implant) IMPLANT
KIT BASIN OR (CUSTOM PROCEDURE TRAY) ×4 IMPLANT
KIT INFUSE X SMALL 1.4CC (Orthopedic Implant) IMPLANT
KIT TURNOVER KIT B (KITS) ×4 IMPLANT
MILL BONE PREP (MISCELLANEOUS) ×2 IMPLANT
NDL HYPO 22X1.5 SAFETY MO (MISCELLANEOUS) ×3 IMPLANT
NDL HYPO 25X1 1.5 SAFETY (NEEDLE) ×2 IMPLANT
NDL SPNL 22GX3.5 QUINCKE BK (NEEDLE) ×3 IMPLANT
NEEDLE HYPO 22X1.5 SAFETY MO (MISCELLANEOUS) IMPLANT
NEEDLE HYPO 25X1 1.5 SAFETY (NEEDLE) ×2 IMPLANT
NEEDLE SPNL 22GX3.5 QUINCKE BK (NEEDLE) IMPLANT
NS IRRIG 1000ML POUR BTL (IV SOLUTION) ×4 IMPLANT
PACK LAMINECTOMY NEURO (CUSTOM PROCEDURE TRAY) ×4 IMPLANT
PAD ARMBOARD 7.5X6 YLW CONV (MISCELLANEOUS) ×9 IMPLANT
ROD RELINE 5.0X45MM (Rod) IMPLANT
SCREW LOCK RSS 4.5/5.0MM (Screw) IMPLANT
SCREW SHANK RELINE MOD 5.0X35 (Screw) IMPLANT
SPIKE FLUID TRANSFER (MISCELLANEOUS) ×4 IMPLANT
SPONGE SURGIFOAM ABS GEL 100 (HEMOSTASIS) ×3 IMPLANT
SPONGE SURGIFOAM ABS GEL SZ50 (HEMOSTASIS) ×2 IMPLANT
SPONGE T-LAP 4X18 ~~LOC~~+RFID (SPONGE) IMPLANT
STRIP CLOSURE SKIN 1/2X4 (GAUZE/BANDAGES/DRESSINGS) ×6 IMPLANT
SUT VIC AB 0 CT1 18XCR BRD8 (SUTURE) ×6 IMPLANT
SUT VIC AB 2-0 CT1 18 (SUTURE) ×4 IMPLANT
SUT VIC AB 4-0 PS2 27 (SUTURE) ×3 IMPLANT
SUT VICRYL 4-0 PS2 18IN ABS (SUTURE) ×2 IMPLANT
TOWEL GREEN STERILE (TOWEL DISPOSABLE) ×6 IMPLANT
TOWEL GREEN STERILE FF (TOWEL DISPOSABLE) ×6 IMPLANT
TRAY FOLEY MTR SLVR 16FR STAT (SET/KITS/TRAYS/PACK) ×2 IMPLANT
WATER STERILE IRR 1000ML POUR (IV SOLUTION) ×4 IMPLANT

## 2023-12-16 NOTE — Plan of Care (Signed)
  Problem: Education: Goal: Knowledge of General Education information will improve Description: Including pain rating scale, medication(s)/side effects and non-pharmacologic comfort measures Outcome: Progressing   Problem: Health Behavior/Discharge Planning: Goal: Ability to manage health-related needs will improve Outcome: Progressing   Problem: Clinical Measurements: Goal: Ability to maintain clinical measurements within normal limits will improve Outcome: Progressing Goal: Will remain free from infection Outcome: Progressing Goal: Diagnostic test results will improve Outcome: Progressing Goal: Respiratory complications will improve Outcome: Progressing Goal: Cardiovascular complication will be avoided Outcome: Progressing   Problem: Nutrition: Goal: Adequate nutrition will be maintained Outcome: Progressing   Problem: Pain Managment: Goal: General experience of comfort will improve and/or be controlled Outcome: Progressing   Problem: Safety: Goal: Ability to remain free from injury will improve Outcome: Progressing

## 2023-12-16 NOTE — Anesthesia Postprocedure Evaluation (Signed)
 Anesthesia Post Note  Patient: Alfred Jones  Procedure(s) Performed: Posterior Lumbar Interbody Fusion - Lumbar four-Lumbar five Posterior Lateral and Interbody fusion (Back) Decompressive Lumbar Laminectomy - left - Lumbar three-Lumbar four (Left: Back)     Patient location during evaluation: PACU Anesthesia Type: General Level of consciousness: awake and alert Pain management: pain level controlled Vital Signs Assessment: post-procedure vital signs reviewed and stable Respiratory status: spontaneous breathing, nonlabored ventilation, respiratory function stable and patient connected to nasal cannula oxygen Cardiovascular status: blood pressure returned to baseline and stable Postop Assessment: no apparent nausea or vomiting Anesthetic complications: no   No notable events documented.  Last Vitals:  Vitals:   12/16/23 1145 12/16/23 1213  BP: 132/82 (!) 141/65  Pulse: 76 69  Resp: 16 16  Temp: 36.8 C (!) 36.4 C  SpO2: 95% 100%    Last Pain:  Vitals:   12/16/23 1213  TempSrc: Oral  PainSc:                  Mariann Barter

## 2023-12-16 NOTE — Transfer of Care (Signed)
 Immediate Anesthesia Transfer of Care Note  Patient: Alfred Jones  Procedure(s) Performed: Posterior Lumbar Interbody Fusion - Lumbar four-Lumbar five Posterior Lateral and Interbody fusion (Back) Decompressive Lumbar Laminectomy - left - Lumbar three-Lumbar four (Left: Back)  Patient Location: PACU  Anesthesia Type:General  Level of Consciousness: sedated  Airway & Oxygen Therapy: Patient Spontanous Breathing  Post-op Assessment: Report given to RN  Post vital signs: Reviewed and stable  Last Vitals:  Vitals Value Taken Time  BP 139/65 12/16/23 1053  Temp 98.1   Pulse 80 12/16/23 1058  Resp 12 12/16/23 1058  SpO2 98 % 12/16/23 1058  Vitals shown include unfiled device data.  Last Pain:  Vitals:   12/16/23 0618  PainSc: 5          Complications: No notable events documented.

## 2023-12-16 NOTE — H&P (Addendum)
 Alfred Jones is an 73 y.o. male.   Chief Complaint: Back bilateral hip and leg pain HPI: 73 year old gentleman with progressive worsening back and bilateral hip and leg pain workup revealed severe spinal stenosis and grade 1 spondylolisthesis L4-5 and severe stenosis at L3-4.   due to the patient's progression of clinical syndrome imaging findings and failed conservative treatment I recommended decompressive laminectomies and interbody fusion at L4-5 and decompressive laminectomy at L3-4 on the left.  I extensively reviewed the risks and benefits of the operation with the patient as well as perioperative course expectations of outcome and alternatives to surgery and he understood and agreed to proceed forward.  Past Medical History:  Diagnosis Date   Acid reflux    Anxiety    Arthritis    RHEUMATOID OR PSORIATIC ?   BCC (basal cell carcinoma of skin) 06/19/2007   left lower chest (CX35FU)   BCC (basal cell carcinoma of skin) infiltrated 03/11/2018   right nose ([facility])   BCC (basal cell carcinoma of skin) ulcerated 07/11/2010   left cheek    Blood dyscrasia    Essential Thrombocythemia   Cancer (HCC)    Depression    Enlarged prostate    Hyperlipidemia    Hypertension 10/2023   Hypothyroidism    Numbness    right foot   Pneumonia    x 1   Pre-diabetes    no meds   Seasonal allergies    Sleep apnea 2017   uses CPAP nightly   Superficial basal cell carcinoma (BCC) 03/11/2018   left shoulder     Past Surgical History:  Procedure Laterality Date   ANTERIOR CERVICAL DECOMP/DISCECTOMY FUSION N/A 09/23/2018   Procedure: Cervical Three-Four Cervical Four-Five Cervical Five-Six Anterior cervical decompression/discectomy/fusion;  Surgeon: Maeola Harman, MD;  Location: [facility] OR;  Service: Neurosurgery;  Laterality: N/A;  Cervical Three-Four Cervical Four-Five Cervical Five-Six Anterior cervical decompression/discectomy/fusion   CARDIAC CATHETERIZATION     Greater than 30 YRS AGO AT [facility]    CATARACT EXTRACTION Bilateral 2023   COLONOSCOPY WITH PROPOFOL N/A 06/23/2020   One small polyp at hepatic flexure that was sessile and ablated without specimen for pathologist. Surveillance 10 years if overall health permits.   CYSTOSCOPY WITH INSERTION OF UROLIFT N/A 08/26/2023   Procedure: CYSTOSCOPY WITH INSERTION OF UROLIFT;  Surgeon: Malen Gauze, MD;  Location: [facility] ORS;  Service: Urology;  Laterality: N/A;   ESOPHAGOGASTRODUODENOSCOPY (EGD) WITH PROPOFOL N/A 08/13/2019   prominent Schatzki ring s/p dilation, overlying erosive reflux esophagitis, small hiatal hernia   EYE SURGERY     METAL REMOVED FROM LEFT EYE  (IN OFFICE)   EYE SURGERY Left 10/2021   retinal tear   MALONEY DILATION N/A 08/13/2019   Procedure: MALONEY DILATION;  Surgeon: Corbin Ade, MD;  Location: [facility] SUITE;  Service: Endoscopy;  Laterality: N/A;   MOLE REMOVAL     POLYPECTOMY  06/23/2020   Procedure: POLYPECTOMY;  Surgeon: Corbin Ade, MD;  Location: [facility] SUITE;  Service: Endoscopy;;   TONSILLECTOMY     as a child    Family History  Problem Relation Age of Onset   Heart disease Mother    Diabetes Mother    Heart attack Mother    Heart disease Father    Diabetes Father    Diabetes Sister    Seizures Sister    Diabetes Brother    Heart attack Brother    Sleep apnea Brother    Heart attack Brother  Sleep apnea Brother    Heart attack Brother    Diabetes Brother    Heart attack Brother    Hypothyroidism Daughter    Hypothyroidism Son    Colon cancer Neg Hx    Social History:  reports that he quit smoking about 37 years ago. His smoking use included cigarettes. He started smoking about 53 years ago. He has a 16 pack-year smoking history. He has been exposed to tobacco smoke. He has never used smokeless tobacco. He reports that he does not drink alcohol and does not use drugs.  Allergies: No Known Allergies  Medications Prior to Admission  Medication Sig Dispense Refill    acetaminophen (TYLENOL) 500 MG tablet Take 1,000 mg by mouth every 8 (eight) hours as needed for moderate pain (pain score 4-6).     aspirin EC 81 MG tablet Take 81 mg by mouth daily. Swallow whole.     celecoxib (CELEBREX) 200 MG capsule Take 200 mg by mouth 2 (two) times daily.     cetirizine (ZYRTEC) 10 MG tablet Take 10 mg by mouth at bedtime.     CINNAMON PO Take 1,200 mg by mouth daily.     clotrimazole (LOTRIMIN) 1 % cream Apply 1 application topically at bedtime. Feet     Co-Enzyme Q-10 100 MG CAPS Take 100 mg by mouth 2 (two) times daily.     Cyanocobalamin (B-12 PO) Take 1 capsule by mouth daily.     DULoxetine (CYMBALTA) 60 MG capsule Take 60 mg by mouth daily.     fexofenadine (ALLEGRA) 180 MG tablet Take 180 mg by mouth daily.     Flaxseed, Linseed, (FLAX SEED OIL) 1000 MG CAPS Take 1,000 mg by mouth at bedtime.     folic acid (FOLVITE) 1 MG tablet Take 1 mg by mouth daily.     hydroxyurea (HYDREA) 500 MG capsule Take 500-1,000 mg by mouth See admin instructions. Take 1000 mg by mouth on Mon, Wed, and Fri. Take 500 daily on Tues, Thurs, Sat, and Sun     ketoconazole (NIZORAL) 2 % cream Apply 1 application  topically daily as needed for irritation.     levothyroxine (SYNTHROID) 50 MCG tablet Take 50 mcg by mouth daily before breakfast.     linaclotide (LINZESS) 290 MCG CAPS capsule Take 1 capsule (290 mcg total) by mouth daily before breakfast. 30 capsule 11   lisinopril (ZESTRIL) 20 MG tablet Take 20 mg by mouth daily.     Melatonin 10 MG CAPS Take 10 mg by mouth at bedtime.      Menthol, Topical Analgesic, (BENGAY EX) Apply 1 Application topically daily as needed (back pain).     NON FORMULARY Pt uses a cpap nightly     OVER THE COUNTER MEDICATION Take 1 capsule by mouth daily. Super Fruits supplement     OVER THE COUNTER MEDICATION Take 1 capsule by mouth daily. Super Vegetables supplement     Oxycodone HCl 10 MG TABS Take 10 mg by mouth 3 (three) times daily as needed (pain).      pantoprazole (PROTONIX) 40 MG tablet Take 40 mg by mouth 2 (two) times daily.     Polyvinyl Alcohol-Povidone (REFRESH OP) Place 1 drop into both eyes daily as needed (dry eyes).     pregabalin (LYRICA) 50 MG capsule Take 50 mg by mouth 2 (two) times daily.     rosuvastatin (CRESTOR) 5 MG tablet Take 5 mg by mouth 2 (two) times a week. Sun and Wed night  senna-docusate (SENOKOT-S) 8.6-50 MG tablet Take 1 tablet by mouth 2 (two) times daily.     tadalafil (CIALIS) 5 MG tablet Take 1 tablet (5 mg total) by mouth daily. 90 tablet 0   tiZANidine (ZANAFLEX) 4 MG tablet Take 4 mg by mouth at bedtime as needed for muscle spasms.     traZODone (DESYREL) 50 MG tablet Take 50 mg by mouth at bedtime.     trolamine salicylate (ASPERCREME) 10 % cream Apply 1 Application topically at bedtime.     TURMERIC PO Take 1 tablet by mouth at bedtime.     VITAMIN D PO Take 1 capsule by mouth daily.     sulfaSALAzine (AZULFIDINE) 500 MG tablet Take 2 tablets (1,000 mg total) by mouth 2 (two) times daily. 120 tablet 2    No results found for this or any previous visit (from the past 48 hours). No results found.  Review of Systems  Musculoskeletal:  Positive for back pain.  Neurological:  Positive for numbness.    Blood pressure (!) 157/78, pulse 73, temperature 98.4 F (36.9 C), height 5\' 10"  (1.778 m), weight 99.8 kg, SpO2 98%. Physical Exam HENT:     Head: Normocephalic.     Right Ear: Tympanic membrane normal.     Nose: Nose normal.     Mouth/Throat:     Mouth: Mucous membranes are moist.  Cardiovascular:     Rate and Rhythm: Normal rate.     Pulses: Normal pulses.  Pulmonary:     Effort: Pulmonary effort is normal.  Musculoskeletal:     Cervical back: Normal range of motion.  Neurological:     Mental Status: He is alert.     Comments: Strength is 5 out of 5 iliopsoas, quads, hamstrings, gastroc, and tibialis, and EHL.      Assessment/Plan 73 year old presents for decompressive  laminectomies interbody fusions at L4-5 and left-sided decompression at L3-4.  Mariam Dollar, MD 12/16/2023, 7:28 AM

## 2023-12-16 NOTE — Progress Notes (Signed)
 PHARMACIST - PHYSICIAN ORDER COMMUNICATION  CONCERNING: P&T Medication Policy on Herbal Medications  DESCRIPTION:  This patient's orders for:  Co-Q10, Flaxseed, Cinnamon and Turmeric  have been noted.  These product(s) are classified as an "herbal" or natural products. Due to a lack of definitive safety studies or FDA approval, nonstandard manufacturing practices, plus the potential risk of unknown drug-drug interactions while on inpatient medications, the Pharmacy and Therapeutics Committee does not permit the use of "herbal" or natural products of this type within [facility]   ACTION TAKEN: The pharmacy department is unable to verify this order at this time. Please reevaluate patient's clinical condition at discharge and address if the herbal or natural product(s) should be resumed at that time.   Dennie Fetters, [place] 12/16/2023 1:04 PM

## 2023-12-16 NOTE — Op Note (Signed)
 Preoperative diagnosis: Lumbar spinal stenosis L3-4 and lumbar spinal stenosis degenerative disc disease and a dynamic spondylolisthesis L4-5.  Postoperative diagnosis: Same.  Procedure: #1 decompressive lumbar laminectomy complete medial facetectomy radical foraminotomies of the L4-L5 nerve roots at L4-5 in excess and requiring more work than would be needed with a standard interbody fusion.  2.  Decompressive laminectomy partial facetectomy and foraminotomies at L3-4 on the left.  3.  Transforaminal lumbar interbody fusion L4-5 utilizing the globus Altera transforaminal cage packed with locally harvested autograft mixed with Vivigen and BMP.  4.  Cortical screw fixation L4-5 utilizing NuVasive cortical screw modular set.  5.  Posterolateral arthrodesis L4-5 utilizing locally harvested autograft mixed with Vivigen and BMP.  Surgeon: Donalee Citrin.  Assistant: Julien Girt.  Anesthesia: General.  EBL: 200.  HPI: 73-year-old woman progressive worsening back bilateral hip and leg pain following L4-L5 nerve pain workup revealed severe spinal stenosis on the left at L3-4 but instability and severe central and foraminal stenosis L4-5 due to the patient's progression of clinical syndrome imaging findings of a conservative treatment I recommended decompressive laminectomy interbody fusion L4-5 with a decompression on the left at L3-4.  I extensively reviewed the risks and benefits of the operation with him as well as perioperative course expectations of outcome and alternatives to surgery and he understood and agreed to proceed forward.  Operative procedure: Patient was brought into the OR was induced under general anesthesia positioned prone on the Wilson frame his back was prepped and draped in routine sterile fashion utilizing anatomical landmarks an incision was drawn out infiltrated and incised the subperiosteal dissection was carried lamina of L3-L4 and L5 bilaterally confirmed by interoperative  x-ray.  Spinous process at L4 was then removed central decompression was begun.  There was severe hourglass compression of thecal sac at L4-5 predominantly from a large synovial cyst coming off the left-sided facet joint this was all teased off the dura removed complete facetectomies were performed I then remove the pars interarticularis at L4 and performed radical from the L4 nerve root on the right at L4-5 it looks like there could possibly have been a conjoined nerve root coming off is very difficult to develop the plane around the lateral aspect of the L5 nerve root and L4 nerve root so I elected to leave this alone and do a transforaminal interbody fusion from the left extensively marched out the foramina and gained access to the lateral margin of the space.  At this point I extended the laminotomy to remove virtually the entire lamina of L3 on the left side performed partial facetectomy and foraminotomies of the distal aspect of the L3 nerve root proximal aspect the L4 nerve root.  There was some spur coming off the medial aspect of the facet joint at L3-4 at this level.  I was conservative in my partial facetectomy secondary to the fact that we are not planning on fusing this level.  Next I incised the disc space at L4-5 and prepared the endplates cleaned out the disc and then sized up and Altera cage packed and extensive mount of autograft mix and BMP anteriorly and towards the right side the disc base and then inserted the cage expanded it and deployed under fluoroscopy.  Then L4 cortical screws were placed under fluoroscopy all these had excellent purchase.  Postop imaging confirmed good position of all the implants I then aggressively decorticated the facet joints and lateral TPs at L4-5 packed and extensive mount of BMP and autograft mix  laterally there then assembled the heads assembled the rods tightened everything down inspected the foramina to confirm patency and no migration of graft material and  all foramina were widely decompressed.  Then anchored in place all the knots and rods and closed the wound in layers after placement of a medium Hemovac drain and injecting Exparel in the fascia.  Vicryl was used to close the layers with a running 4 subcuticular in the skin.  Dermabond benzoin Steri-Strips and sterile dressing was applied patient recovery in stable condition.  At the end the case all needle count sponge counts were correct.

## 2023-12-17 ENCOUNTER — Encounter (HOSPITAL_COMMUNITY): Payer: Self-pay | Admitting: Neurosurgery

## 2023-12-17 DIAGNOSIS — E039 Hypothyroidism, unspecified: Secondary | ICD-10-CM | POA: Diagnosis not present

## 2023-12-17 DIAGNOSIS — M48061 Spinal stenosis, lumbar region without neurogenic claudication: Secondary | ICD-10-CM | POA: Diagnosis not present

## 2023-12-17 DIAGNOSIS — Z85828 Personal history of other malignant neoplasm of skin: Secondary | ICD-10-CM | POA: Diagnosis not present

## 2023-12-17 DIAGNOSIS — Z87891 Personal history of nicotine dependence: Secondary | ICD-10-CM | POA: Diagnosis not present

## 2023-12-17 DIAGNOSIS — I10 Essential (primary) hypertension: Secondary | ICD-10-CM | POA: Diagnosis not present

## 2023-12-17 DIAGNOSIS — M4316 Spondylolisthesis, lumbar region: Secondary | ICD-10-CM | POA: Diagnosis not present

## 2023-12-17 MED ORDER — OXYCODONE HCL 5 MG PO TABS
5.0000 mg | ORAL_TABLET | ORAL | Status: DC | PRN
Start: 2023-12-17 — End: 2023-12-17
  Administered 2023-12-17 (×2): 10 mg via ORAL
  Filled 2023-12-17 (×2): qty 2

## 2023-12-17 MED ORDER — LINACLOTIDE 145 MCG PO CAPS
290.0000 ug | ORAL_CAPSULE | Freq: Every day | ORAL | Status: DC
  Administered 2023-12-17: 290 ug via ORAL
  Filled 2023-12-17: qty 2

## 2023-12-17 MED ORDER — OXYCODONE HCL 5 MG PO TABS
5.0000 mg | ORAL_TABLET | ORAL | 0 refills | Status: DC | PRN
Start: 2023-12-17 — End: 2024-07-21

## 2023-12-17 NOTE — Care Management Obs Status (Signed)
 MEDICARE OBSERVATION STATUS NOTIFICATION   Patient Details  Name: Alfred Jones MRN: [id number] Date of Birth: [birth year]   Medicare Observation Status Notification Given:  Yes    Sherilyn Banker 12/17/2023, 9:41 AM

## 2023-12-17 NOTE — Care Management Obs Status (Deleted)
 MEDICARE OBSERVATION STATUS NOTIFICATION   Patient Details  Name: Alfred Jones MRN: [id number] Date of Birth: 09-21-1951   Medicare Observation Status Notification Given:       Sherilyn Banker 12/17/2023, 9:40 AM

## 2023-12-17 NOTE — Discharge Summary (Signed)
 Physician Discharge Summary  Patient ID: Alfred Jones MRN: [id number] DOB/AGE: 71-Nov-1952 71 y.o.  Admit date: 12/16/2023 Discharge date: 12/17/2023  Admission Diagnoses: Lumbar spinal stenosis L3-4 and lumbar spinal stenosis degenerative disc disease and a dynamic spondylolisthesis L4-5.     Discharge Diagnoses: same   Discharged Condition: good  Hospital Course: The patient was admitted on 12/16/2023 and taken to the operating room where the patient underwent PLIF  L4-5, left lami L3-4. The patient tolerated the procedure well and was taken to the recovery room and then to the floor in stable condition. The hospital course was routine. There were no complications. The wound remained clean dry and intact. Pt had appropriate back soreness. No complaints of leg pain or new N/T/W. The patient remained afebrile with stable vital signs, and tolerated a regular diet. The patient continued to increase activities, and pain was well controlled with oral pain medications.   Consults: None  Significant Diagnostic Studies:  Results for orders placed or performed during the hospital encounter of 12/09/23  Surgical pcr screen   Collection Time: 12/09/23  1:58 PM   Specimen: Nasal Mucosa; Nasal Swab  Result Value Ref Range   MRSA, PCR NEGATIVE NEGATIVE   Staphylococcus aureus NEGATIVE NEGATIVE  Type and screen [facility]   Collection Time: 12/09/23  2:00 PM  Result Value Ref Range   ABO/RH(D) O POS    Antibody Screen NEG    Sample Expiration 12/23/2023,2359    Extend sample reason      NO TRANSFUSIONS OR PREGNANCY IN THE PAST 3 MONTHS Performed at [facility], [address], [place], [place] [address]   CBC   Collection Time: 12/09/23  2:30 PM  Result Value Ref Range   WBC 6.9 4.0 - 10.5 K/uL   RBC 3.16 (L) 4.22 - 5.81 MIL/uL   Hemoglobin 12.1 (L) 13.0 - 17.0 g/dL   HCT 29.5 (L) 28.4 - 13.2 %   MCV 110.4 (H) 80.0 - 100.0 fL   MCH 38.3 (H) 26.0 - 34.0 pg   MCHC  34.7 30.0 - 36.0 g/dL   RDW 44.0 10.2 - 72.5 %   Platelets 481 (H) 150 - 400 K/uL   nRBC 0.0 0.0 - 0.2 %  Basic metabolic panel   Collection Time: 12/09/23  2:30 PM  Result Value Ref Range   Sodium 140 135 - 145 mmol/L   Potassium 4.2 3.5 - 5.1 mmol/L   Chloride 107 98 - 111 mmol/L   CO2 27 22 - 32 mmol/L   Glucose, Bld 108 (H) 70 - 99 mg/dL   BUN 20 8 - 23 mg/dL   Creatinine, Ser 3.66 0.61 - 1.24 mg/dL   Calcium 8.6 (L) 8.9 - 10.3 mg/dL   GFR, Estimated >44 >03 mL/min   Anion gap 6 5 - 15    DG Lumbar Spine 2-3 Views Result Date: 12/16/2023 CLINICAL DATA:  L4-5 PLIF. EXAM: LUMBAR SPINE - 2-3 VIEW COMPARISON:  Lumbar spine radiographs 09/12/2023.  MRI 09/04/2023. FINDINGS: C-arm fluoroscopy was provided in the operating room without the presence of a radiologist.34.5 seconds fluoroscopy time. 24.7 mGy air kerma. Three C-arm fluoroscopic images were obtained intraoperatively and are submitted for post operative interpretation. These images demonstrate lumbar spondylolytic shin and subsequent placement of an interbody spacer and bilateral pedicle screws at the L4 and L5 levels. The interconnecting rods have not yet been placed. No complications identified. Please see intraoperative findings for further detail. IMPRESSION: Intraoperative fluoroscopic guidance for  L4-5 fusion. Electronically Signed   By: Carey Bullocks M.D.   On: 12/16/2023 14:17   DG C-Arm 1-60 Min-No Report Result Date: 12/16/2023 Fluoroscopy was utilized by the requesting physician.  No radiographic interpretation.   DG C-Arm 1-60 Min-No Report Result Date: 12/16/2023 Fluoroscopy was utilized by the requesting physician.  No radiographic interpretation.    Antibiotics:  Anti-infectives (From admission, onward)    Start     Dose/Rate Route Frequency Ordered Stop   12/16/23 1600  ceFAZolin (ANCEF) IVPB 2g/100 mL premix        2 g 200 mL/hr over 30 Minutes Intravenous Every 8 hours 12/16/23 1156 12/18/23 1559    12/16/23 0600  ceFAZolin (ANCEF) IVPB 2g/100 mL premix        2 g 200 mL/hr over 30 Minutes Intravenous On call to O.R. 12/16/23 1610 12/16/23 0833   12/16/23 0555  ceFAZolin (ANCEF) 2-4 GM/100ML-% IVPB       Note to Pharmacy: Camillo Flaming: cabinet override      12/16/23 0555 12/16/23 1759       Discharge Exam: Blood pressure (!) 121/57, pulse 78, temperature 98.1 F (36.7 C), temperature source Oral, resp. rate 18, height 5\' 10"  (1.778 m), weight 99.8 kg, SpO2 99%. Neurologic: Grossly normal Ambulating and voiding well incision cdi   Discharge Medications:   Allergies as of 12/17/2023   No Known Allergies      Medication List     TAKE these medications    acetaminophen 500 MG tablet Commonly known as: TYLENOL Take 1,000 mg by mouth every 8 (eight) hours as needed for moderate pain (pain score 4-6).   aspirin EC 81 MG tablet Take 81 mg by mouth daily. Swallow whole.   B-12 PO Take 1 capsule by mouth daily.   BENGAY EX Apply 1 Application topically daily as needed (back pain).   celecoxib 200 MG capsule Commonly known as: CELEBREX Take 200 mg by mouth 2 (two) times daily.   cetirizine 10 MG tablet Commonly known as: ZYRTEC Take 10 mg by mouth at bedtime.   CINNAMON PO Take 1,200 mg by mouth daily.   clotrimazole 1 % cream Commonly known as: LOTRIMIN Apply 1 application topically at bedtime. Feet   Co-Enzyme Q-10 100 MG Caps Take 100 mg by mouth 2 (two) times daily.   DULoxetine 60 MG capsule Commonly known as: CYMBALTA Take 60 mg by mouth daily.   fexofenadine 180 MG tablet Commonly known as: ALLEGRA Take 180 mg by mouth daily.   Flax Seed Oil 1000 MG Caps Take 1,000 mg by mouth at bedtime.   folic acid 1 MG tablet Commonly known as: FOLVITE Take 1 mg by mouth daily.   hydroxyurea 500 MG capsule Commonly known as: HYDREA Take 500-1,000 mg by mouth See admin instructions. Take 1000 mg by mouth on Mon, Wed, and Fri. Take 500 daily on Tues,  Thurs, Sat, and Sun   ketoconazole 2 % cream Commonly known as: NIZORAL Apply 1 application  topically daily as needed for irritation.   levothyroxine 50 MCG tablet Commonly known as: SYNTHROID Take 50 mcg by mouth daily before breakfast.   linaclotide 290 MCG Caps capsule Commonly known as: LINZESS Take 1 capsule (290 mcg total) by mouth daily before breakfast.   lisinopril 20 MG tablet Commonly known as: ZESTRIL Take 20 mg by mouth daily.   Melatonin 10 MG Caps Take 10 mg by mouth at bedtime.   NON FORMULARY Pt uses a cpap nightly  OVER THE COUNTER MEDICATION Take 1 capsule by mouth daily. Super Fruits supplement   OVER THE COUNTER MEDICATION Take 1 capsule by mouth daily. Super Vegetables supplement   Oxycodone HCl 10 MG Tabs Take 10 mg by mouth 3 (three) times daily as needed (pain). What changed: Another medication with the same name was added. Make sure you understand how and when to take each.   oxyCODONE 5 MG immediate release tablet Commonly known as: Oxy IR/ROXICODONE Take 1-2 tablets (5-10 mg total) by mouth every 4 (four) hours as needed (pain). What changed: You were already taking a medication with the same name, and this prescription was added. Make sure you understand how and when to take each.   pantoprazole 40 MG tablet Commonly known as: PROTONIX Take 40 mg by mouth 2 (two) times daily.   pregabalin 50 MG capsule Commonly known as: LYRICA Take 50 mg by mouth 2 (two) times daily.   REFRESH OP Place 1 drop into both eyes daily as needed (dry eyes).   rosuvastatin 5 MG tablet Commonly known as: CRESTOR Take 5 mg by mouth 2 (two) times a week. Sun and Wed night   senna-docusate 8.6-50 MG tablet Commonly known as: Senokot-S Take 1 tablet by mouth 2 (two) times daily.   sulfaSALAzine 500 MG tablet Commonly known as: AZULFIDINE Take 2 tablets (1,000 mg total) by mouth 2 (two) times daily.   tadalafil 5 MG tablet Commonly known as:  CIALIS Take 1 tablet (5 mg total) by mouth daily.   tiZANidine 4 MG tablet Commonly known as: ZANAFLEX Take 4 mg by mouth at bedtime as needed for muscle spasms.   traZODone 50 MG tablet Commonly known as: DESYREL Take 50 mg by mouth at bedtime.   trolamine salicylate 10 % cream Commonly known as: ASPERCREME Apply 1 Application topically at bedtime.   TURMERIC PO Take 1 tablet by mouth at bedtime.   VITAMIN D PO Take 1 capsule by mouth daily.        Disposition: home   Final Dx: PLIF L4-5, left lami L3-4       Signed: Sherryl Manges 12/17/2023, 7:56 AM

## 2023-12-17 NOTE — Evaluation (Signed)
 Occupational Therapy Evaluation Patient Details Name: Alfred Jones MRN: [id number] DOB: 31-Aug-1951 Today's Date: 12/17/2023   History of Present Illness   73 yo male s/p PLIF L4-5 Left laminectomy L3-4 PMH arthritis, anxiety  HLD HTN numbness R foot, sleep apnea with CPAP, L shoulder basal cell carcinoma, ACDF,     Clinical Impressions Patient evaluated by Occupational Therapy with no further acute OT needs identified. All education has been completed and the patient has no further questions. See below for any follow-up Occupational Therapy or equipment needs. OT to sign off. Thank you for referral.       If plan is discharge home, recommend the following:         Functional Status Assessment   Patient has had a recent decline in their functional status and demonstrates the ability to make significant improvements in function in a reasonable and predictable amount of time.     Equipment Recommendations   None recommended by OT     Recommendations for Other Services         Precautions/Restrictions   Precautions Precautions: Back Recall of Precautions/Restrictions: Intact Precaution/Restrictions Comments: handout provided and edcuated on back precautions Required Braces or Orthoses: Spinal Brace Spinal Brace: Lumbar corset;Applied in sitting position Restrictions Weight Bearing Restrictions Per Provider Order: No     Mobility Bed Mobility Overal bed mobility: Modified Independent             General bed mobility comments: practice x2 to sequence and helping with pushing off surface    Transfers Overall transfer level: Modified independent                        Balance                                           ADL either performed or assessed with clinical judgement   ADL Overall ADL's : Needs assistance/impaired Eating/Feeding: Independent   Grooming: Independent           Upper Body Dressing : Modified  independent;Sitting   Lower Body Dressing: Modified independent;Adhering to back precautions;With adaptive equipment Lower Body Dressing Details (indicate cue type and reason): will have wife (A) if needed Toilet Transfer: Modified Independent     Toileting - Clothing Manipulation Details (indicate cue type and reason): educated on LOB hygiene and positioning     Functional mobility during ADLs: Supervision/safety General ADL Comments: wife present for all education   Back handout provided and reviewed adls in detail. Pt with orders for no brace but brace in room that was picked up prior to admission.  Pt educated on: clothing between brace, never sleep in brace, set an alarm at night for medication, avoid sitting for long periods of time, correct bed positioning for sleeping, correct sequence for bed mobility, avoiding lifting more than 5 pounds and never wash directly over incision. All education is complete and patient indicates understanding.   Vision   Vision Assessment?: No apparent visual deficits     Perception         Praxis         Pertinent Vitals/Pain Pain Assessment Pain Assessment: Faces Faces Pain Scale: Hurts a little bit Pain Location: back Pain Descriptors / Indicators: Operative site guarding Pain Intervention(s): Limited activity within patient's tolerance, Monitored during session, Premedicated before session, Repositioned  Extremity/Trunk Assessment Upper Extremity Assessment Upper Extremity Assessment: Overall WFL for tasks assessed   Lower Extremity Assessment Lower Extremity Assessment: Overall WFL for tasks assessed   Cervical / Trunk Assessment Cervical / Trunk Assessment: Back Surgery   Communication Communication Communication: No apparent difficulties   Cognition Arousal: Alert Behavior During Therapy: WFL for tasks assessed/performed Cognition: No apparent impairments                               Following commands:  Intact       Cueing  General Comments   Cueing Techniques: Tactile cues;Verbal cues      Exercises     Shoulder Instructions      Home Living Family/patient expects to be discharged to:: Private residence Living Arrangements: Spouse/significant other Available Help at Discharge: Family;Available 24 hours/day Type of Home: House Home Access: Stairs to enter [employer] of Steps: 2 Entrance Stairs-Rails: None Home Layout: One level     Bathroom Shower/Tub: Producer, television/film/video: Handicapped height     Home Equipment: Shower seat;Grab bars - toilet;Hand held shower head;Adaptive equipment;Rolling Walker (2 wheels);Lift chair Adaptive Equipment: Reacher Additional Comments: has a dog that wife will care for      Prior Functioning/Environment Prior Level of Function : Independent/Modified Independent;Working/employed;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)                    OT Problem List:     OT Treatment/Interventions:        OT Goals(Current goals can be found in the care plan section)   Acute Rehab OT Goals OT Goal Formulation: With patient/family   OT Frequency:       Co-evaluation              AM-PAC OT "6 Clicks" Daily Activity     Outcome Measure Help from another person eating meals?: None Help from another person taking care of personal grooming?: None Help from another person toileting, which includes using toliet, bedpan, or urinal?: None Help from another person bathing (including washing, rinsing, drying)?: None Help from another person to put on and taking off regular upper body clothing?: None Help from another person to put on and taking off regular lower body clothing?: None 6 Click Score: 24   End of Session Nurse Communication: Mobility status;Precautions  Activity Tolerance: Patient tolerated treatment well Patient left: in bed;with call bell/phone within reach;with family/visitor present  OT Visit  Diagnosis: Unsteadiness on feet (R26.81)                Time: 4098-1191 OT Time Calculation (min): 45 min Charges:  OT General Charges $OT Visit: 1 Visit OT Evaluation $OT Eval Moderate Complexity: 1 Mod OT Treatments $Self Care/Home Management : 8-22 mins   Brynn, OTR/L  Acute Rehabilitation Services Office: [phone] .   Mateo Flow 12/17/2023, 11:20 AM

## 2023-12-17 NOTE — Progress Notes (Signed)
 Patient in no acute distress nor complaints of pain nor discomfort; incision on back is clean, dry and intact; No c/o pain at this time. Room was checked and accounted for all patient's belongings; discharge instructions concerning her medications, incision care, follow up appointment and when to call the doctor as needed were all discussed with patient by RN and he expressed understanding on the instructions given.

## 2023-12-17 NOTE — Discharge Instructions (Signed)
Wound Care Keep incision covered and dry until post op day 3. You may remove the Honeycomb dressing on post op day 3. Leave steri-strips on back.  They will fall off by themselves. Do not put any creams, lotions, or ointments on incision. You are fine to shower. Let water run over incision and pat dry.  Activity Walk each and every day, increasing distance each day. No lifting greater than 8 lbs.  No lifting no bending no twisting no driving or riding a car unless coming back and forth to see the doctor. If provided with back brace, wear when out of bed.  It is not necessary to wear brace in bed.  Diet Resume your normal diet.     Call Your Doctor If Any of These Occur Redness, drainage, or swelling at the wound.  Temperature greater than 101 degrees. Severe pain not relieved by pain medication. Incision starts to come apart.  Follow Up Appt Call [phone] if you have one or any problem.

## 2023-12-17 NOTE — Care Management CC44 (Signed)
 Condition Code 44 Documentation Completed  Patient Details  Name: Alfred Jones MRN: [id number] Date of Birth: 03/05/1951   Condition Code 44 given:    Patient signature on Condition Code 44 notice:    Documentation of 2 MD's agreement:    Code 44 added to claim:       Kermit Balo, RN 12/17/2023, 9:54 AM

## 2023-12-30 ENCOUNTER — Other Ambulatory Visit: Payer: Self-pay

## 2023-12-30 ENCOUNTER — Emergency Department (HOSPITAL_COMMUNITY)

## 2023-12-30 ENCOUNTER — Encounter (HOSPITAL_COMMUNITY): Payer: Self-pay | Admitting: Emergency Medicine

## 2023-12-30 ENCOUNTER — Emergency Department (HOSPITAL_COMMUNITY)
Admission: EM | Admit: 2023-12-30 | Discharge: 2023-12-30 | Disposition: A | Discharge status: Home / Self Care | Attending: Emergency Medicine | Admitting: Emergency Medicine

## 2023-12-30 DIAGNOSIS — R079 Chest pain, unspecified: Secondary | ICD-10-CM | POA: Diagnosis not present

## 2023-12-30 DIAGNOSIS — R002 Palpitations: Secondary | ICD-10-CM | POA: Diagnosis not present

## 2023-12-30 DIAGNOSIS — Z7982 Long term (current) use of aspirin: Secondary | ICD-10-CM | POA: Diagnosis not present

## 2023-12-30 LAB — BASIC METABOLIC PANEL
Anion gap: 10 (ref 5–15)
BUN: 14 mg/dL (ref 8–23)
CO2: 26 mmol/L (ref 22–32)
Calcium: 9.2 mg/dL (ref 8.9–10.3)
Chloride: 107 mmol/L (ref 98–111)
Creatinine, Ser: 1 mg/dL (ref 0.61–1.24)
GFR, Estimated: >60 mL/min (ref >60)
Glucose, Bld: 107 mg/dL — ABNORMAL HIGH (ref 70–99)
Potassium: 4.1 mmol/L (ref 3.5–5.1)
Sodium: 143 mmol/L (ref 135–145)

## 2023-12-30 LAB — CBC
HCT: 33.5 % — ABNORMAL LOW (ref 39.0–52.0)
Hemoglobin: 11.3 g/dL — ABNORMAL LOW (ref 13.0–17.0)
MCH: 37.4 pg — ABNORMAL HIGH (ref 26.0–34.0)
MCHC: 33.7 g/dL (ref 30.0–36.0)
MCV: 110.9 fL — ABNORMAL HIGH (ref 80.0–100.0)
Platelets: 606 10*3/uL — ABNORMAL HIGH (ref 150–400)
RBC: 3.02 MIL/uL — ABNORMAL LOW (ref 4.22–5.81)
RDW: 12.5 % (ref 11.5–15.5)
WBC: 8.4 10*3/uL (ref 4.0–10.5)
nRBC: 0 % (ref 0.0–0.2)

## 2023-12-30 LAB — TROPONIN I (HIGH SENSITIVITY)
Troponin I (High Sensitivity): 8 ng/L (ref <18)
Troponin I (High Sensitivity): 12 ng/L (ref <18)

## 2023-12-30 NOTE — Discharge Instructions (Addendum)
 Your workup was reassuring.  No concerning rhythms noted on the EKG or the brief telemetry review.  Please follow-up with cardiology.  Have given you an urgent referral.  They can place a monitor for a 2-week.  Return for any emergent symptoms.

## 2023-12-30 NOTE — ED Triage Notes (Signed)
 Pt in with chest fluttering and tachycardia (HR 140-150's) that he noticed right before going to bed tonight. Reporting some sob and chest discomfort

## 2023-12-30 NOTE — ED Provider Notes (Signed)
 [place] EMERGENCY DEPARTMENT AT [facility] Provider Note   CSN: [id number] Arrival date & time: 12/30/23  0129     History  Chief Complaint  Patient presents with   Tachycardia    Alfred Jones is a 73 y.o. male.  73 year old male presents today for concern of palpitations that occurred last night.  Episode lasted about 1 hour.  He states that his wife checked his heart rate during this event and it was about 140-150.  It still resolved after he got to the emergency room.  No associated shortness of breath, chest pain, lightheadedness, or other concerns.  No prior history of A-fib, currently not on anticoagulation.  The history is provided by the patient. No language interpreter was used.       Home Medications Prior to Admission medications   Medication Sig Start Date End Date Taking? Authorizing Provider  acetaminophen (TYLENOL) 500 MG tablet Take 1,000 mg by mouth every 8 (eight) hours as needed for moderate pain (pain score 4-6).    [provider]  aspirin EC 81 MG tablet Take 81 mg by mouth daily. Swallow whole.    [provider]  celecoxib (CELEBREX) 200 MG capsule Take 200 mg by mouth 2 (two) times daily. 07/21/21   [provider]  cetirizine (ZYRTEC) 10 MG tablet Take 10 mg by mouth at bedtime.    [provider]  CINNAMON PO Take 1,200 mg by mouth daily.    [provider]  clotrimazole (LOTRIMIN) 1 % cream Apply 1 application topically at bedtime. Feet    [provider]  Co-Enzyme Q-10 100 MG CAPS Take 100 mg by mouth 2 (two) times daily.    [provider]  Cyanocobalamin (B-12 PO) Take 1 capsule by mouth daily.    [provider]  DULoxetine (CYMBALTA) 60 MG capsule Take 60 mg by mouth daily.    [provider]  fexofenadine (ALLEGRA) 180 MG tablet Take 180 mg by mouth daily.    [provider]  Flaxseed, Linseed, (FLAX SEED OIL) 1000 MG CAPS Take 1,000 mg by  mouth at bedtime.    [provider]  folic acid (FOLVITE) 1 MG tablet Take 1 mg by mouth daily.    [provider]  hydroxyurea (HYDREA) 500 MG capsule Take 500-1,000 mg by mouth See admin instructions. Take 1000 mg by mouth on Mon, Wed, and Fri. Take 500 daily on Tues, Thurs, Sat, and Sun    [provider]  ketoconazole (NIZORAL) 2 % cream Apply 1 application  topically daily as needed for irritation.    [provider]  levothyroxine (SYNTHROID) 50 MCG tablet Take 50 mcg by mouth daily before breakfast. 09/04/22   [provider]  linaclotide Karlene Einstein) 290 MCG CAPS capsule Take 1 capsule (290 mcg total) by mouth daily before breakfast. 05/03/23   Rourk, Gerrit Friends, MD  lisinopril (ZESTRIL) 20 MG tablet Take 20 mg by mouth daily. 11/25/23   [provider]  Melatonin 10 MG CAPS Take 10 mg by mouth at bedtime.     [provider]  Menthol, Topical Analgesic, (BENGAY EX) Apply 1 Application topically daily as needed (back pain).    [provider]  NON FORMULARY Pt uses a cpap nightly    [provider]  OVER THE COUNTER MEDICATION Take 1 capsule by mouth daily. Super Fruits supplement    [provider]  OVER THE COUNTER MEDICATION Take 1 capsule by mouth daily.  Super Vegetables supplement    [provider]  oxyCODONE (OXY IR/ROXICODONE) 5 MG immediate release tablet Take 1-2 tablets (5-10 mg total) by mouth every 4 (four) hours as needed (pain). 12/17/23   Donalee Citrin, MD  Oxycodone HCl 10 MG TABS Take 10 mg by mouth 3 (three) times daily as needed (pain). 07/25/21   [provider]  pantoprazole (PROTONIX) 40 MG tablet Take 40 mg by mouth 2 (two) times daily. 01/11/21   [provider]  Polyvinyl Alcohol-Povidone (REFRESH OP) Place 1 drop into both eyes daily as needed (dry eyes).    [provider]  pregabalin (LYRICA) 50 MG capsule Take 50 mg by mouth 2 (two) times daily. 01/24/22    [provider]  rosuvastatin (CRESTOR) 5 MG tablet Take 5 mg by mouth 2 (two) times a week. [name] and Wed night    [provider]  senna-docusate (SENOKOT-S) 8.6-50 MG tablet Take 1 tablet by mouth 2 (two) times daily.    [provider]  sulfaSALAzine (AZULFIDINE) 500 MG tablet Take 2 tablets (1,000 mg total) by mouth 2 (two) times daily. 10/14/23   Gearldine Bienenstock, PA-C  tadalafil (CIALIS) 5 MG tablet Take 1 tablet (5 mg total) by mouth daily. 12/09/23   McKenzie, Mardene Celeste, MD  tiZANidine (ZANAFLEX) 4 MG tablet Take 4 mg by mouth at bedtime as needed for muscle spasms. 11/22/22   [provider]  traZODone (DESYREL) 50 MG tablet Take 50 mg by mouth at bedtime.    [provider]  trolamine salicylate (ASPERCREME) 10 % cream Apply 1 Application topically at bedtime.    [provider]  TURMERIC PO Take 1 tablet by mouth at bedtime.    [provider]  VITAMIN D PO Take 1 capsule by mouth daily.    [provider]      Allergies    Patient has no known allergies.    Review of Systems   Review of Systems  Constitutional:  Negative for chills and fever.  Respiratory:  Negative for shortness of breath.   Cardiovascular:  Positive for palpitations. Negative for chest pain and leg swelling.  Neurological:  Negative for light-headedness.  All other systems reviewed and are negative.   Physical Exam Updated Vital Signs BP 119/61   Pulse 61   Temp 97.7 F (36.5 C)   Resp 16   Wt 99.8 kg   SpO2 100%   BMI 31.57 kg/m  Physical Exam Vitals and nursing note reviewed.  Constitutional:      General: He is not in acute distress.    Appearance: Normal appearance. He is not ill-appearing.  HENT:     Head: Normocephalic and atraumatic.     Nose: Nose normal.  Eyes:     General: No scleral icterus.    Extraocular Movements: Extraocular movements intact.     Conjunctiva/sclera: Conjunctivae normal.  Cardiovascular:      Rate and Rhythm: Normal rate and regular rhythm.  Pulmonary:     Effort: Pulmonary effort is normal. No respiratory distress.     Breath sounds: Normal breath sounds. No wheezing or rales.  Abdominal:     General: There is no distension.     Tenderness: There is no abdominal tenderness.  Musculoskeletal:        General: Normal range of motion.     Cervical back: Normal range of motion.     Comments: Well-healed scar over his lumbar spine.  Steri-Strips in place.  No surrounding erythema or drainage.  Skin:    General: Skin is warm and dry.  Neurological:     General: No focal deficit present.     Mental Status: He is alert. Mental status is at baseline.    ED Results / Procedures / Treatments   Labs (all labs ordered are listed, but only abnormal results are displayed) Labs Reviewed  BASIC METABOLIC PANEL - Abnormal; Notable for the following components:      Result Value   Glucose, Bld 107 (*)    All other components within normal limits  CBC - Abnormal; Notable for the following components:   RBC 3.02 (*)    Hemoglobin 11.3 (*)    HCT 33.5 (*)    MCV 110.9 (*)    MCH 37.4 (*)    Platelets 606 (*)    All other components within normal limits  TROPONIN I (HIGH SENSITIVITY)  TROPONIN I (HIGH SENSITIVITY)    EKG None  Radiology DG Chest 2 View Result Date: 12/30/2023 CLINICAL DATA:  Chest pain EXAM: CHEST - 2 VIEW COMPARISON:  None Available. FINDINGS: Lungs are well expanded, symmetric, and clear. No pneumothorax or pleural effusion. Cardiac size within normal limits. Pulmonary vascularity is normal. Osseous structures are age-appropriate. No acute bone abnormality. IMPRESSION: No active cardiopulmonary disease. Electronically Signed   By: Helyn Numbers M.D.   On: 12/30/2023 03:03    Procedures Procedures    Medications Ordered in ED Medications - No data to display  ED Course/ Medical Decision Making/ A&P                                 Medical Decision  Making Amount and/or Complexity of Data Reviewed Labs: ordered. Radiology: ordered.   Medical Decision Making / ED Course   This patient presents to the ED for concern of palpitations, this involves an extensive number of treatment options, and is a complaint that carries with it a high risk of complications and morbidity.  The differential diagnosis includes A-fib, sinus tachycardia, a flutter, SVT, PVCs, PACs  MDM: 73 year old male presents today for concern of palpitation that occurred last night.  Lasted about an hour.  Admission considered but will reevaluate after labs and imaging.  CBC with hemoglobin around patient's baseline.  Without leukocytosis.  BMP without acute concern.  Troponin negative x 2.  Chest x-ray without acute cardiopulmonary process.  EKG without acute ischemic change. Patient was placed on telemetry.  No concerning rhythms noted while I was in the room looking at the telemetry.  However later in the emergency patient came in and patient was removed from the room.  His telemetry data was not safe.  I discussed with nurse as well as nurse tech.  We were unable to pull up the previous data.  I had a discussion with the patient and his wife.  I offered additional cardiac monitoring versus discharge and close cardiology follow-up for potential Holter monitor.  They prefer discharge with close follow-up.  Given there is no documented episodes of A-fib will not start on any treatment.  Strict return precautions given.  They voiced understanding and are in agreement with plan.    Additional history obtained: -Additional history obtained from wife at bedside -External records from outside source obtained and reviewed including: Chart review including previous notes, labs, imaging, consultation notes   Lab Tests: -I ordered, reviewed, and interpreted labs.   The pertinent  results include:   Labs Reviewed  BASIC METABOLIC PANEL - Abnormal; Notable for the following  components:      Result Value   Glucose, Bld 107 (*)    All other components within normal limits  CBC - Abnormal; Notable for the following components:   RBC 3.02 (*)    Hemoglobin 11.3 (*)    HCT 33.5 (*)    MCV 110.9 (*)    MCH 37.4 (*)    Platelets 606 (*)    All other components within normal limits  TROPONIN I (HIGH SENSITIVITY)  TROPONIN I (HIGH SENSITIVITY)      EKG  EKG Interpretation Date/Time:    Ventricular Rate:    PR Interval:    QRS Duration:    QT Interval:    QTC Calculation:   R Axis:      Text Interpretation:           Imaging Studies ordered: I ordered imaging studies including chest x-ray I independently visualized and interpreted imaging. I agree with the radiologist interpretation   Medicines ordered and prescription drug management: No orders of the defined types were placed in this encounter.   -I have reviewed the patients home medicines and have made adjustments as needed   Cardiac Monitoring: The patient was maintained on a cardiac monitor.  I personally viewed and interpreted the cardiac monitored which showed an underlying rhythm of: Normal sinus rhythm  Social Determinants of Health:  Factors impacting patients care include: Good family support   Reevaluation: After the interventions noted above, I reevaluated the patient and found that they have :stayed the same Patient had no episodes during the emergency room stay.  Co morbidities that complicate the patient evaluation  Past Medical History:  Diagnosis Date   Acid reflux    Anxiety    Arthritis    RHEUMATOID OR PSORIATIC ?   BCC (basal cell carcinoma of skin) 06/19/2007   left lower chest (CX35FU)   BCC (basal cell carcinoma of skin) infiltrated 03/11/2018   right nose (MOHS)   BCC (basal cell carcinoma of skin) ulcerated 07/11/2010   left cheek    Blood dyscrasia    Essential Thrombocythemia   Cancer (HCC)    Depression    Enlarged prostate     Hyperlipidemia    Hypertension 10/2023   Hypothyroidism    Numbness    right foot   Pneumonia    x 1   Pre-diabetes    no meds   Seasonal allergies    Sleep apnea 2017   uses CPAP nightly   Superficial basal cell carcinoma (BCC) 03/11/2018   left shoulder       Dispostion: Discharged in stable condition.  Cardiology referral given.  Final Clinical Impression(s) / ED Diagnoses Final diagnoses:  Palpitations    Rx / DC Orders ED Discharge Orders          Ordered    Ambulatory referral to Cardiology       Comments: If you have not heard from the Cardiology office within the next 72 hours please call [phone].   12/30/23 1319              Marita Kansas, PA-C 12/30/23 1517    Gloris Manchester, MD 12/30/23 1553

## 2024-01-01 ENCOUNTER — Ambulatory Visit: Payer: Medicare Other | Admitting: Urology

## 2024-01-01 VITALS — BP 137/68 | HR 85

## 2024-01-01 DIAGNOSIS — N529 Male erectile dysfunction, unspecified: Secondary | ICD-10-CM | POA: Diagnosis not present

## 2024-01-01 DIAGNOSIS — N401 Enlarged prostate with lower urinary tract symptoms: Secondary | ICD-10-CM

## 2024-01-01 DIAGNOSIS — R3912 Poor urinary stream: Secondary | ICD-10-CM | POA: Diagnosis not present

## 2024-01-01 DIAGNOSIS — N138 Other obstructive and reflux uropathy: Secondary | ICD-10-CM | POA: Diagnosis not present

## 2024-01-01 DIAGNOSIS — N411 Chronic prostatitis: Secondary | ICD-10-CM

## 2024-01-01 MED ORDER — TADALAFIL 5 MG PO TABS: 5.0000 mg | ORAL_TABLET | Freq: Every day | ORAL | 3 refills | Status: AC

## 2024-01-01 MED ORDER — ALFUZOSIN HCL ER 10 MG PO TB24: 10.0000 mg | ORAL_TABLET | Freq: Every day | ORAL | 11 refills | Status: DC

## 2024-01-01 NOTE — Progress Notes (Signed)
 01/01/2024 4:19 PM   [name] [birth year] [id number]  Referring provider: Juliette Alcide, MD [address] [place],  [place] [address]  Post void dribbling   HPI: Mr Cinquemani is a 73yo here for followup for BPH and erectile dysfunction. He has starting/stopping of his urinary stream and post void dribbling which has been worsening over the past 2 months. Urine stream is fair.Marland Kitchen He uses tadalafil 5mg  daily which works well for his erections   PMH: Past Medical History:  Diagnosis Date   Acid reflux    Anxiety    Arthritis    RHEUMATOID OR PSORIATIC ?   BCC (basal cell carcinoma of skin) 06/19/2007   left lower chest (CX35FU)   BCC (basal cell carcinoma of skin) infiltrated 03/11/2018   right nose (MOHS)   BCC (basal cell carcinoma of skin) ulcerated 07/11/2010   left cheek    Blood dyscrasia    Essential Thrombocythemia   Cancer (HCC)    Depression    Enlarged prostate    Hyperlipidemia    Hypertension 10/2023   Hypothyroidism    Numbness    right foot   Pneumonia    x 1   Pre-diabetes    no meds   Seasonal allergies    Sleep apnea 2017   uses CPAP nightly   Superficial basal cell carcinoma (BCC) 03/11/2018   left shoulder     Surgical History: Past Surgical History:  Procedure Laterality Date   ANTERIOR CERVICAL DECOMP/DISCECTOMY FUSION N/A 09/23/2018   Procedure: Cervical Three-Four Cervical Four-Five Cervical Five-Six Anterior cervical decompression/discectomy/fusion;  Surgeon: Maeola Harman, MD;  Location: [facility] OR;  Service: Neurosurgery;  Laterality: N/A;  Cervical Three-Four Cervical Four-Five Cervical Five-Six Anterior cervical decompression/discectomy/fusion   CARDIAC CATHETERIZATION     Greater than 30 YRS AGO AT [facility]   CATARACT EXTRACTION Bilateral 2023   COLONOSCOPY WITH PROPOFOL N/A 06/23/2020   One small polyp at hepatic flexure that was sessile and ablated without specimen for pathologist. Surveillance 10 years if overall health permits.    CYSTOSCOPY WITH INSERTION OF UROLIFT N/A 08/26/2023   Procedure: CYSTOSCOPY WITH INSERTION OF UROLIFT;  Surgeon: Malen Gauze, MD;  Location: [facility] ORS;  Service: Urology;  Laterality: N/A;   ESOPHAGOGASTRODUODENOSCOPY (EGD) WITH PROPOFOL N/A 08/13/2019   prominent Schatzki ring s/p dilation, overlying erosive reflux esophagitis, small hiatal hernia   EYE SURGERY     METAL REMOVED FROM LEFT EYE  (IN OFFICE)   EYE SURGERY Left 10/2021   retinal tear   LUMBAR LAMINECTOMY/DECOMPRESSION MICRODISCECTOMY Left 12/16/2023   Procedure: Decompressive Lumbar Laminectomy - left - Lumbar three-Lumbar four;  Surgeon: Donalee Citrin, MD;  Location: [facility] OR;  Service: Neurosurgery;  Laterality: Left;   MALONEY DILATION N/A 08/13/2019   Procedure: Elease Hashimoto DILATION;  Surgeon: Corbin Ade, MD;  Location: [facility] END[facility] SUITE;  Service: Endoscopy;  Laterality: N/A;   MOLE REMOVAL     POLYPECTOMY  06/23/2020   Procedure: POLYPECTOMY;  Surgeon: Corbin Ade, MD;  Location: [facility] SUITE;  Service: Endoscopy;;   POSTERIOR FUSION PEDICLE SCREW PLACEMENT N/A 12/16/2023   Procedure: Posterior Lumbar Interbody Fusion - Lumbar four-Lumbar five Posterior Lateral and Interbody fusion;  Surgeon: Donalee Citrin, MD;  Location: [facility] OR;  Service: Neurosurgery;  Laterality: N/A;   TONSILLECTOMY     as a child    Home Medications:  Allergies as of 01/01/2024   No Known Allergies      Medication List  Accurate as of January 01, 2024  4:19 PM. If you have any questions, ask your nurse or doctor.          acetaminophen 500 MG tablet Commonly known as: TYLENOL Take 1,000 mg by mouth every 8 (eight) hours as needed for moderate pain (pain score 4-6).   aspirin EC 81 MG tablet Take 81 mg by mouth daily. Swallow whole.   B-12 PO Take 1 capsule by mouth daily.   BENGAY EX Apply 1 Application topically daily as needed (back pain).   celecoxib 200 MG capsule Commonly known as: CELEBREX Take 200 mg by mouth 2 (two)  times daily.   cetirizine 10 MG tablet Commonly known as: ZYRTEC Take 10 mg by mouth at bedtime.   CINNAMON PO Take 1,200 mg by mouth daily.   clotrimazole 1 % cream Commonly known as: LOTRIMIN Apply 1 application topically at bedtime. Feet   Co-Enzyme Q-10 100 MG Caps Take 100 mg by mouth 2 (two) times daily.   DULoxetine 60 MG capsule Commonly known as: CYMBALTA Take 60 mg by mouth daily.   fexofenadine 180 MG tablet Commonly known as: ALLEGRA Take 180 mg by mouth daily.   Flax Seed Oil 1000 MG Caps Take 1,000 mg by mouth at bedtime.   folic acid 1 MG tablet Commonly known as: FOLVITE Take 1 mg by mouth daily.   hydroxyurea 500 MG capsule Commonly known as: HYDREA Take 500-1,000 mg by mouth See admin instructions. Take 1000 mg by mouth on Mon, Wed, and Fri. Take 500 daily on Tues, Thurs, Sat, and Sun   ketoconazole 2 % cream Commonly known as: NIZORAL Apply 1 application  topically daily as needed for irritation.   levothyroxine 50 MCG tablet Commonly known as: SYNTHROID Take 50 mcg by mouth daily before breakfast.   linaclotide 290 MCG Caps capsule Commonly known as: LINZESS Take 1 capsule (290 mcg total) by mouth daily before breakfast.   lisinopril 20 MG tablet Commonly known as: ZESTRIL Take 20 mg by mouth daily.   Melatonin 10 MG Caps Take 10 mg by mouth at bedtime.   NON FORMULARY Pt uses a cpap nightly   OVER THE COUNTER MEDICATION Take 1 capsule by mouth daily. Super Fruits supplement   OVER THE COUNTER MEDICATION Take 1 capsule by mouth daily. Super Vegetables supplement   Oxycodone HCl 10 MG Tabs Take 10 mg by mouth 3 (three) times daily as needed (pain).   oxyCODONE 5 MG immediate release tablet Commonly known as: Oxy IR/ROXICODONE Take 1-2 tablets (5-10 mg total) by mouth every 4 (four) hours as needed (pain).   pantoprazole 40 MG tablet Commonly known as: PROTONIX Take 40 mg by mouth 2 (two) times daily.   pregabalin 50 MG  capsule Commonly known as: LYRICA Take 50 mg by mouth 2 (two) times daily.   REFRESH OP Place 1 drop into both eyes daily as needed (dry eyes).   rosuvastatin 5 MG tablet Commonly known as: CRESTOR Take 5 mg by mouth 2 (two) times a week. Sun and Wed night   senna-docusate 8.6-50 MG tablet Commonly known as: Senokot-S Take 1 tablet by mouth 2 (two) times daily.   sulfaSALAzine 500 MG tablet Commonly known as: AZULFIDINE Take 2 tablets (1,000 mg total) by mouth 2 (two) times daily.   tadalafil 5 MG tablet Commonly known as: CIALIS Take 1 tablet (5 mg total) by mouth daily.   tiZANidine 4 MG tablet Commonly known as: ZANAFLEX Take 4 mg by mouth  at bedtime as needed for muscle spasms.   traZODone 50 MG tablet Commonly known as: DESYREL Take 50 mg by mouth at bedtime.   trolamine salicylate 10 % cream Commonly known as: ASPERCREME Apply 1 Application topically at bedtime.   TURMERIC PO Take 1 tablet by mouth at bedtime.   VITAMIN D PO Take 1 capsule by mouth daily.        Allergies: No Known Allergies  Family History: Family History  Problem Relation Age of Onset   Heart disease Mother    Diabetes Mother    Heart attack Mother    Heart disease Father    Diabetes Father    Diabetes Sister    Seizures Sister    Diabetes Brother    Heart attack Brother    Sleep apnea Brother    Heart attack Brother    Sleep apnea Brother    Heart attack Brother    Diabetes Brother    Heart attack Brother    Hypothyroidism Daughter    Hypothyroidism Son    Colon cancer Neg Hx     Social History:  reports that he quit smoking about 37 years ago. His smoking use included cigarettes. He started smoking about 53 years ago. He has a 16 pack-year smoking history. He has been exposed to tobacco smoke. He has never used smokeless tobacco. He reports that he does not drink alcohol and does not use drugs.  ROS: All other review of systems were reviewed and are negative except  what is noted above in HPI  Physical Exam: BP 137/68   Pulse 85   Constitutional:  Alert and oriented, No acute distress. HEENT: [place] AT, moist mucus membranes.  Trachea midline, no masses. Cardiovascular: No clubbing, cyanosis, or edema. Respiratory: Normal respiratory effort, no increased work of breathing. GI: Abdomen is soft, nontender, nondistended, no abdominal masses GU: No CVA tenderness.  Lymph: No cervical or inguinal lymphadenopathy. Skin: No rashes, bruises or suspicious lesions. Neurologic: Grossly intact, no focal deficits, moving all 4 extremities. Psychiatric: Normal mood and affect.  Laboratory Data: Lab Results  Component Value Date   WBC 8.4 12/30/2023   HGB 11.3 (L) 12/30/2023   HCT 33.5 (L) 12/30/2023   MCV 110.9 (H) 12/30/2023   PLT 606 (H) 12/30/2023    Lab Results  Component Value Date   CREATININE 1.00 12/30/2023    Lab Results  Component Value Date   PSA 1.12 12/02/2015    No results found for: "TESTOSTERONE"  Lab Results  Component Value Date   HGBA1C 5.2 04/15/2018    Urinalysis    Component Value Date/Time   APPEARANCEUR Clear 10/03/2023 1529   GLUCOSEU Negative 10/03/2023 1529   BILIRUBINUR Negative 10/03/2023 1529   PROTEINUR Trace 10/03/2023 1529   UROBILINOGEN 1.0 01/15/2020 1121   NITRITE Negative 10/03/2023 1529   LEUKOCYTESUR Trace (A) 10/03/2023 1529    Lab Results  Component Value Date   LABMICR See below: 10/03/2023   WBCUA 0-5 10/03/2023   LABEPIT 0-10 10/03/2023   MUCUS Present 07/29/2020   BACTERIA None seen 10/03/2023    Pertinent Imaging:  Results for orders placed during the hospital encounter of 07/11/20  DG Abd 1 View  Narrative CLINICAL DATA:  Constipation  EXAM: ABDOMEN - 1 VIEW  COMPARISON:  X-ray abdomen 04/18/2018, CT abdomen pelvis 06/03/2012  FINDINGS: The bowel gas pattern is normal. Stool within the ascending and proximal transverse colon. No radio-opaque calculi or other significant  radiographic abnormality are seen. Visualized osseous  structures are grossly unremarkable.  IMPRESSION: Nonobstructive bowel gas pattern with stool within the ascending colon proximal transverse colon.   Electronically Signed By: Tish Frederickson M.D. On: 07/11/2020 23:52  No results found for this or any previous visit.  No results found for this or any previous visit.  No results found for this or any previous visit.  No results found for this or any previous visit.  No results found for this or any previous visit.  No results found for this or any previous visit.  No results found for this or any previous visit.   Assessment & Plan:    1. Benign prostatic hyperplasia with urinary obstruction (Primary) Restart uroxatral 10mg  at bedtime. If his LUTS imporve we will proceed with cystoscopy.  - Urinalysis, Routine w reflex microscopic  2. Weak urinary stream Restart uroxatral 10mg   3. Erectile dysfunction Continue tadalafil 5mg  daily   No follow-ups on file.  Wilkie Aye, MD  [facility] [place]
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
## 2024-01-02 LAB — URINALYSIS, ROUTINE W REFLEX MICROSCOPIC
Bilirubin, UA: NEGATIVE
Glucose, UA: NEGATIVE
Ketones, UA: NEGATIVE
Leukocytes,UA: NEGATIVE
Nitrite, UA: NEGATIVE
Protein,UA: NEGATIVE
RBC, UA: NEGATIVE
Specific Gravity, UA: 1.03 (ref 1.005–1.030)
Urobilinogen, Ur: 1 mg/dL (ref 0.2–1.0)
pH, UA: 5.5 (ref 5.0–7.5)

## 2024-01-03 ENCOUNTER — Encounter: Payer: Self-pay | Admitting: Internal Medicine

## 2024-01-03 ENCOUNTER — Ambulatory Visit: Attending: Internal Medicine | Admitting: Internal Medicine

## 2024-01-03 VITALS — BP 116/62 | HR 74 | Ht 70.0 in | Wt 230.0 lb

## 2024-01-03 DIAGNOSIS — R002 Palpitations: Secondary | ICD-10-CM | POA: Insufficient documentation

## 2024-01-03 MED ORDER — DILTIAZEM HCL 30 MG PO TABS: 30.0000 mg | ORAL_TABLET | ORAL | 3 refills | Status: DC | PRN

## 2024-01-03 MED ORDER — DILTIAZEM HCL 60 MG PO TABS: 60.0000 mg | ORAL_TABLET | ORAL | 3 refills | Status: AC | PRN

## 2024-01-03 NOTE — Patient Instructions (Addendum)
 Medication Instructions:  Your physician has recommended you make the following change in your medication:   -Start Diltiazem 60 mg tablet once daily as needed for heart rate over 130 bpm.  *If you need a refill on your cardiac medications before your next appointment, please call your pharmacy*   Lab Work: None If you have labs (blood work) drawn today and your tests are completely normal, you will receive your results only by: MyChart Message (if you have MyChart) OR A paper copy in the mail If you have any lab test that is abnormal or we need to change your treatment, we will call you to review the results.   Testing/Procedures: None   Follow-Up: At [facility], you and your health needs are our priority.  As part of our continuing mission to provide you with exceptional heart care, we have created designated Provider Care Teams.  These Care Teams include your primary Cardiologist (physician) and Advanced Practice Providers (APPs -  Physician Assistants and Nurse Practitioners) who all work together to provide you with the care you need, when you need it.  We recommend signing up for the patient portal called "MyChart".  Sign up information is provided on this After Visit Summary.  MyChart is used to connect with patients for Virtual Visits (Telemedicine).  Patients are able to view lab/test results, encounter notes, upcoming appointments, etc.  Non-urgent messages can be sent to your provider as well.   To learn more about what you can do with MyChart, go to [email or web address].    Your next appointment:    As Needed.   Provider:   You may see Vishnu P Mallipeddi, MD or one of the following Advanced Practice Providers on your designated Care Team:   [place], PA-C  Jacolyn Reedy, [place]     Other Instructions

## 2024-01-03 NOTE — Progress Notes (Signed)
 Cardiology Office Note  Date: 01/03/2024   ID: Alfred Jones, DOB Feb 08, 1951, MRN [id number]  PCP:  Juliette Alcide, MD  Cardiologist:  Marjo Bicker, MD Electrophysiologist:  None   Reason for Office Visit: Palpitations   History of Present Illness: Alfred Jones is a 73 y.o. male known to have essential thrombocytosis (on hydroxyurea), HLD is here for follow-up visit.  Patient had ER visit on 09/04/2022 with sudden onset of SOB and nonspecific chest discomfort for which he went to the ER.  His labs, troponins and EKG were within normal limits.  He underwent echocardiogram and stress test in December 2023 that showed normal LVEF, no valvular heart disease as well as no evidence of ischemia respectively.  He also underwent carotid Doppler bilateral that showed less than 39% stenosis. He had ER visit recently in March 2025 for evaluation of palpitations.  He reported having elevated heart rate around 156 bpm and lasted for 1 hour before he went to the ER.  He had symptoms of palpitations at that time.  He did have palpitations previously too but these are shorter spells.  Does not have any rate controlling medications to be taken as needed at home.  Continues to have occasional chest pains.  No DOE, dizziness, presyncope, syncope or leg swelling.  He does have significant history of PCI/CABG in his family especially his brothers in 40-50s. His father had CABG in his late 61s. Former smoker, quit 30 years ago, denied alcohol use and illicit drug use.  Past Medical History:  Diagnosis Date   Acid reflux    Anxiety    Arthritis    RHEUMATOID OR PSORIATIC ?   BCC (basal cell carcinoma of skin) 06/19/2007   left lower chest (CX35FU)   BCC (basal cell carcinoma of skin) infiltrated 03/11/2018   right nose (MOHS)   BCC (basal cell carcinoma of skin) ulcerated 07/11/2010   left cheek    Blood dyscrasia    Essential Thrombocythemia   Cancer (HCC)    Depression    Enlarged  prostate    Hyperlipidemia    Hypertension 10/2023   Hypothyroidism    Numbness    right foot   Pneumonia    x 1   Pre-diabetes    no meds   Seasonal allergies    Sleep apnea 2017   uses CPAP nightly   Superficial basal cell carcinoma (BCC) 03/11/2018   left shoulder     Past Surgical History:  Procedure Laterality Date   ANTERIOR CERVICAL DECOMP/DISCECTOMY FUSION N/A 09/23/2018   Procedure: Cervical Three-Four Cervical Four-Five Cervical Five-Six Anterior cervical decompression/discectomy/fusion;  Surgeon: Maeola Harman, MD;  Location: [facility] OR;  Service: Neurosurgery;  Laterality: N/A;  Cervical Three-Four Cervical Four-Five Cervical Five-Six Anterior cervical decompression/discectomy/fusion   CARDIAC CATHETERIZATION     Greater than 30 YRS AGO AT [facility]   CATARACT EXTRACTION Bilateral 2023   COLONOSCOPY WITH PROPOFOL N/A 06/23/2020   One small polyp at hepatic flexure that was sessile and ablated without specimen for pathologist. Surveillance 10 years if overall health permits.   CYSTOSCOPY WITH INSERTION OF UROLIFT N/A 08/26/2023   Procedure: CYSTOSCOPY WITH INSERTION OF UROLIFT;  Surgeon: Malen Gauze, MD;  Location: [facility];  Service: Urology;  Laterality: N/A;   ESOPHAGOGASTRODUODENOSCOPY (EGD) WITH PROPOFOL N/A 08/13/2019   prominent Schatzki ring s/p dilation, overlying erosive reflux esophagitis, small hiatal hernia   EYE SURGERY     METAL REMOVED FROM LEFT EYE  (IN OFFICE)  EYE SURGERY Left 10/2021   retinal tear   LUMBAR LAMINECTOMY/DECOMPRESSION MICRODISCECTOMY Left 12/16/2023   Procedure: Decompressive Lumbar Laminectomy - left - Lumbar three-Lumbar four;  Surgeon: Donalee Citrin, MD;  Location: [facility] OR;  Service: Neurosurgery;  Laterality: Left;   MALONEY DILATION N/A 08/13/2019   Procedure: Elease Hashimoto DILATION;  Surgeon: Corbin Ade, MD;  Location: [facility];  Service: Endoscopy;  Laterality: N/A;   MOLE REMOVAL     POLYPECTOMY  06/23/2020   Procedure:  POLYPECTOMY;  Surgeon: Corbin Ade, MD;  Location: [facility];  Service: Endoscopy;;   POSTERIOR FUSION PEDICLE SCREW PLACEMENT N/A 12/16/2023   Procedure: Posterior Lumbar Interbody Fusion - Lumbar four-Lumbar five Posterior Lateral and Interbody fusion;  Surgeon: Donalee Citrin, MD;  Location: [facility] OR;  Service: Neurosurgery;  Laterality: N/A;   TONSILLECTOMY     as a child    Current Outpatient Medications  Medication Sig Dispense Refill   acetaminophen (TYLENOL) 500 MG tablet Take 1,000 mg by mouth every 8 (eight) hours as needed for moderate pain (pain score 4-6).     alfuzosin (UROXATRAL) 10 MG 24 hr tablet Take 1 tablet (10 mg total) by mouth at bedtime. 30 tablet 11   aspirin EC 81 MG tablet Take 81 mg by mouth daily. Swallow whole.     celecoxib (CELEBREX) 200 MG capsule Take 200 mg by mouth 2 (two) times daily.     cetirizine (ZYRTEC) 10 MG tablet Take 10 mg by mouth at bedtime.     CINNAMON PO Take 1,200 mg by mouth daily.     clotrimazole (LOTRIMIN) 1 % cream Apply 1 application topically at bedtime. Feet     Co-Enzyme Q-10 100 MG CAPS Take 100 mg by mouth 2 (two) times daily.     Cyanocobalamin (B-12 PO) Take 1 capsule by mouth daily.     DULoxetine (CYMBALTA) 60 MG capsule Take 60 mg by mouth daily.     fexofenadine (ALLEGRA) 180 MG tablet Take 180 mg by mouth daily.     Flaxseed, Linseed, (FLAX SEED OIL) 1000 MG CAPS Take 1,000 mg by mouth at bedtime.     folic acid (FOLVITE) 1 MG tablet Take 1 mg by mouth daily.     hydroxyurea (HYDREA) 500 MG capsule Take 500-1,000 mg by mouth See admin instructions. Take 1000 mg by mouth on Mon, Wed, and Fri. Take 500 daily on Tues, Thurs, Sat, and Sun     ketoconazole (NIZORAL) 2 % cream Apply 1 application  topically daily as needed for irritation.     levothyroxine (SYNTHROID) 50 MCG tablet Take 50 mcg by mouth daily before breakfast.     linaclotide (LINZESS) 290 MCG CAPS capsule Take 1 capsule (290 mcg total) by mouth daily before  breakfast. 30 capsule 11   lisinopril (ZESTRIL) 20 MG tablet Take 20 mg by mouth daily.     Melatonin 10 MG CAPS Take 10 mg by mouth at bedtime.      Menthol, Topical Analgesic, (BENGAY EX) Apply 1 Application topically daily as needed (back pain).     NON FORMULARY Pt uses a cpap nightly     OVER THE COUNTER MEDICATION Take 1 capsule by mouth daily. Super Fruits supplement     OVER THE COUNTER MEDICATION Take 1 capsule by mouth daily. Super Vegetables supplement     Oxycodone HCl 10 MG TABS Take 10 mg by mouth 3 (three) times daily as needed (pain).     pantoprazole (PROTONIX) 40 MG  tablet Take 40 mg by mouth 2 (two) times daily.     Polyvinyl Alcohol-Povidone (REFRESH OP) Place 1 drop into both eyes daily as needed (dry eyes).     pregabalin (LYRICA) 50 MG capsule Take 50 mg by mouth 2 (two) times daily.     rosuvastatin (CRESTOR) 5 MG tablet Take 5 mg by mouth 2 (two) times a week. Sun and Wed night     senna-docusate (SENOKOT-S) 8.6-50 MG tablet Take 1 tablet by mouth 2 (two) times daily.     sulfaSALAzine (AZULFIDINE) 500 MG tablet Take 2 tablets (1,000 mg total) by mouth 2 (two) times daily. 120 tablet 2   tadalafil (CIALIS) 5 MG tablet Take 1 tablet (5 mg total) by mouth daily. 90 tablet 3   tiZANidine (ZANAFLEX) 4 MG tablet Take 4 mg by mouth at bedtime as needed for muscle spasms.     traZODone (DESYREL) 50 MG tablet Take 50 mg by mouth at bedtime.     trolamine salicylate (ASPERCREME) 10 % cream Apply 1 Application topically at bedtime.     TURMERIC PO Take 1 tablet by mouth at bedtime.     VITAMIN D PO Take 1 capsule by mouth daily.     oxyCODONE (OXY IR/ROXICODONE) 5 MG immediate release tablet Take 1-2 tablets (5-10 mg total) by mouth every 4 (four) hours as needed (pain). (Patient not taking: Reported on 01/03/2024) 30 tablet 0   No current facility-administered medications for this visit.   Allergies:  Patient has no known allergies.   Social History: The patient  reports that  he quit smoking about 37 years ago. His smoking use included cigarettes. He started smoking about 53 years ago. He has a 16 pack-year smoking history. He has been exposed to tobacco smoke. He has never used smokeless tobacco. He reports that he does not drink alcohol and does not use drugs.   Family History: The patient's family history includes Diabetes in his brother, brother, father, mother, and sister; Heart attack in his brother, brother, brother, brother, and mother; Heart disease in his father and mother; Hypothyroidism in his daughter and son; Seizures in his sister; Sleep apnea in his brother and brother.   ROS:  Please see the history of present illness. Otherwise, complete review of systems is positive for none.  All other systems are reviewed and negative.   Physical Exam: VS:  BP 116/62   Pulse 74   Ht 5\' 10"  (1.778 m)   Wt 230 lb (104.3 kg)   SpO2 93%   BMI 33.00 kg/m , BMI Body mass index is 33 kg/m.  Wt Readings from Last 3 Encounters:  01/03/24 230 lb (104.3 kg)  12/30/23 220 lb (99.8 kg)  12/16/23 220 lb (99.8 kg)    General: Patient appears comfortable at rest. HEENT: Conjunctiva and lids normal, oropharynx clear with moist mucosa. Neck: Supple, no elevated JVP, no thyromegaly. Lungs: Clear to auscultation, nonlabored breathing at rest. Cardiac: Regular rate and rhythm, no S3 or significant systolic murmur, no pericardial rub. Abdomen: Soft, nontender, no hepatomegaly, bowel sounds present, no guarding or rebound. Extremities: No pitting edema, distal pulses 2+. Skin: Warm and dry. Musculoskeletal: No kyphosis. Neuropsychiatric: Alert and oriented x3, affect grossly appropriate.  ECG:  NSR and no ST-T changes  Recent Labwork: 09/03/2023: ALT 11; AST 14 12/30/2023: BUN 14; Creatinine, Ser 1.00; Hemoglobin 11.3; Platelets 606; Potassium 4.1; Sodium 143     Component Value Date/Time   CHOL 196 04/15/2018 1043   TRIG  230 (H) 04/15/2018 1043   HDL 33 (L)  04/15/2018 1043   CHOLHDL 5.9 (H) 04/15/2018 1043   CHOLHDL 4.8 12/02/2015 0807   VLDL 29 12/02/2015 0807   LDLCALC 117 (H) 04/15/2018 1043     Assessment and Plan:   # Palpitations Patient has occasional palpitations but recently in March 2025, he had palpitations lasting for 1 hour with HR more than 156 bpm.  This prompted ER visit, but EKG in the ER showed normal sinus rhythm.  Since palpitations are occasional, he will not benefit from event monitor at this time.  I instructed him to purchase Kardia mobile or/and smart watch with EKG lead functionality.  Will prescribe diltiazem 60 mg as needed daily for palpitations or HR more than 130 bpm.  I discussed with him to try vagal maneuvers if HR more than 130 bpm and has palpitations.  # Chest pain # Family history of CAD -Continues to have occasional chest pain.  Echocardiogram in 2023 showed normal LVEF and no valvular heart disease.  Stress test in 2023 showed no evidence of ischemia.  Will continue to monitor.  If chest pain becomes frequent, he will need repeat stress testing.  # HLD, unknown values -Continue rosuvastatin 5 mg p.o. at bedtime.  HLD management per PCP.    Medication Adjustments/Labs and Tests Ordered: Current medicines are reviewed at length with the patient today.  Concerns regarding medicines are outlined above.   Tests Ordered: No orders of the defined types were placed in this encounter.   Medication Changes: No orders of the defined types were placed in this encounter.   Disposition:  Follow up as needed  Signed Khristina Janota Verne Spurr, MD, 01/03/2024 9:47 AM    [facility] at [facility] [address] [place], [place], [place] [address]

## 2024-01-07 ENCOUNTER — Encounter: Payer: Self-pay | Admitting: Urology

## 2024-01-07 NOTE — Patient Instructions (Signed)

## 2024-01-15 ENCOUNTER — Ambulatory Visit (INDEPENDENT_AMBULATORY_CARE_PROVIDER_SITE_OTHER)

## 2024-01-15 ENCOUNTER — Ambulatory Visit

## 2024-01-15 ENCOUNTER — Ambulatory Visit: Payer: Medicare Other | Attending: Physician Assistant | Admitting: Physician Assistant

## 2024-01-15 ENCOUNTER — Encounter: Payer: Self-pay | Admitting: Physician Assistant

## 2024-01-15 VITALS — BP 129/63 | HR 70 | Resp 16 | Ht 70.0 in | Wt 235.0 lb

## 2024-01-15 DIAGNOSIS — Z981 Arthrodesis status: Secondary | ICD-10-CM | POA: Insufficient documentation

## 2024-01-15 DIAGNOSIS — Z8639 Personal history of other endocrine, nutritional and metabolic disease: Secondary | ICD-10-CM | POA: Diagnosis not present

## 2024-01-15 DIAGNOSIS — M79671 Pain in right foot: Secondary | ICD-10-CM

## 2024-01-15 DIAGNOSIS — M51369 Other intervertebral disc degeneration, lumbar region without mention of lumbar back pain or lower extremity pain: Secondary | ICD-10-CM | POA: Insufficient documentation

## 2024-01-15 DIAGNOSIS — D473 Essential (hemorrhagic) thrombocythemia: Secondary | ICD-10-CM | POA: Insufficient documentation

## 2024-01-15 DIAGNOSIS — M170 Bilateral primary osteoarthritis of knee: Secondary | ICD-10-CM | POA: Diagnosis not present

## 2024-01-15 DIAGNOSIS — E559 Vitamin D deficiency, unspecified: Secondary | ICD-10-CM | POA: Diagnosis not present

## 2024-01-15 DIAGNOSIS — M19071 Primary osteoarthritis, right ankle and foot: Secondary | ICD-10-CM | POA: Insufficient documentation

## 2024-01-15 DIAGNOSIS — M79672 Pain in left foot: Secondary | ICD-10-CM

## 2024-01-15 DIAGNOSIS — M19072 Primary osteoarthritis, left ankle and foot: Secondary | ICD-10-CM | POA: Diagnosis not present

## 2024-01-15 DIAGNOSIS — Z79899 Other long term (current) drug therapy: Secondary | ICD-10-CM | POA: Insufficient documentation

## 2024-01-15 DIAGNOSIS — Z840 Family history of diseases of the skin and subcutaneous tissue: Secondary | ICD-10-CM | POA: Diagnosis not present

## 2024-01-15 DIAGNOSIS — G4709 Other insomnia: Secondary | ICD-10-CM | POA: Insufficient documentation

## 2024-01-15 DIAGNOSIS — I7381 Erythromelalgia: Secondary | ICD-10-CM | POA: Insufficient documentation

## 2024-01-15 DIAGNOSIS — M5030 Other cervical disc degeneration, unspecified cervical region: Secondary | ICD-10-CM | POA: Insufficient documentation

## 2024-01-15 DIAGNOSIS — M1990 Unspecified osteoarthritis, unspecified site: Secondary | ICD-10-CM | POA: Insufficient documentation

## 2024-01-15 DIAGNOSIS — M19041 Primary osteoarthritis, right hand: Secondary | ICD-10-CM | POA: Insufficient documentation

## 2024-01-15 DIAGNOSIS — M19042 Primary osteoarthritis, left hand: Secondary | ICD-10-CM | POA: Diagnosis not present

## 2024-01-15 DIAGNOSIS — M79642 Pain in left hand: Secondary | ICD-10-CM

## 2024-01-15 DIAGNOSIS — G629 Polyneuropathy, unspecified: Secondary | ICD-10-CM | POA: Insufficient documentation

## 2024-01-15 DIAGNOSIS — D0439 Carcinoma in situ of skin of other parts of face: Secondary | ICD-10-CM | POA: Diagnosis not present

## 2024-01-15 DIAGNOSIS — M79641 Pain in right hand: Secondary | ICD-10-CM

## 2024-01-15 DIAGNOSIS — Z8719 Personal history of other diseases of the digestive system: Secondary | ICD-10-CM | POA: Insufficient documentation

## 2024-01-15 MED ORDER — SULFASALAZINE 500 MG PO TABS: 1000.0000 mg | ORAL_TABLET | Freq: Two times a day (BID) | ORAL | 2 refills | Status: DC

## 2024-01-15 NOTE — Progress Notes (Signed)
 X-rays of both hands are consistent with osteoarthritis and inflammatory arthritis overlap.  No radiographic progression noted since 2022.   X-rays of both feet consistent with osteoarthritis-no radiographic progression noted.

## 2024-01-15 NOTE — Patient Instructions (Signed)

## 2024-01-15 NOTE — Progress Notes (Signed)
 X-rays of the left knee are consistent with moderate to severe osteoarthritis and severe chondromalacia patella. Please notify the patient.

## 2024-01-23 DIAGNOSIS — M48061 Spinal stenosis, lumbar region without neurogenic claudication: Secondary | ICD-10-CM | POA: Diagnosis not present

## 2024-02-04 DIAGNOSIS — H00019 Hordeolum externum unspecified eye, unspecified eyelid: Secondary | ICD-10-CM | POA: Diagnosis not present

## 2024-02-04 DIAGNOSIS — L570 Actinic keratosis: Secondary | ICD-10-CM | POA: Diagnosis not present

## 2024-02-04 DIAGNOSIS — Z85828 Personal history of other malignant neoplasm of skin: Secondary | ICD-10-CM | POA: Diagnosis not present

## 2024-02-04 DIAGNOSIS — X32XXXD Exposure to sunlight, subsequent encounter: Secondary | ICD-10-CM | POA: Diagnosis not present

## 2024-02-04 DIAGNOSIS — Z08 Encounter for follow-up examination after completed treatment for malignant neoplasm: Secondary | ICD-10-CM | POA: Diagnosis not present

## 2024-02-19 DIAGNOSIS — M5416 Radiculopathy, lumbar region: Secondary | ICD-10-CM | POA: Diagnosis not present

## 2024-02-20 DIAGNOSIS — D473 Essential (hemorrhagic) thrombocythemia: Secondary | ICD-10-CM | POA: Diagnosis not present

## 2024-02-26 ENCOUNTER — Ambulatory Visit: Admitting: Urology

## 2024-02-26 VITALS — BP 124/71 | HR 79

## 2024-02-26 DIAGNOSIS — N138 Other obstructive and reflux uropathy: Secondary | ICD-10-CM

## 2024-02-26 DIAGNOSIS — N401 Enlarged prostate with lower urinary tract symptoms: Secondary | ICD-10-CM

## 2024-02-26 LAB — URINALYSIS, ROUTINE W REFLEX MICROSCOPIC
Bilirubin, UA: NEGATIVE
Glucose, UA: NEGATIVE
Ketones, UA: NEGATIVE
Leukocytes,UA: NEGATIVE
Nitrite, UA: NEGATIVE
Protein,UA: NEGATIVE
RBC, UA: NEGATIVE
Specific Gravity, UA: 1.02 (ref 1.005–1.030)
Urobilinogen, Ur: 1 mg/dL (ref 0.2–1.0)
pH, UA: 6 (ref 5.0–7.5)

## 2024-02-26 MED ORDER — CIPROFLOXACIN HCL 500 MG PO TABS
500.0000 mg | ORAL_TABLET | Freq: Once | ORAL | Status: AC
  Administered 2024-02-26: 500 mg via ORAL

## 2024-02-26 NOTE — Progress Notes (Signed)
   02/26/24  CC: difficulty urinating   HPI: Mr Perch is a cystoscopy for difficulty urinating Blood pressure 124/71, pulse 79. NED. A&Ox3.   No respiratory distress   Abd soft, NT, ND Normal phallus with bilateral descended testicles  Cystoscopy Procedure Note  Patient identification was confirmed, informed consent was obtained, and patient was prepped using Betadine solution.  Lidocaine  jelly was administered per urethral meatus.     Pre-Procedure: - Inspection reveals a normal caliber ureteral meatus.  Procedure: The flexible cystoscope was introduced without difficulty - No urethral strictures/lesions are present. - Enlarged prostate apical obstructing tissue - Normal bladder neck - Bilateral ureteral orifices identified - Bladder mucosa  reveals no ulcers, tumors, or lesions - No bladder stones - No trabeculation    Post-Procedure: - Patient tolerated the procedure well  Assessment/ Plan: We discussed the management of his BPH including continued medical therapy, Rezum, Urolift, TURP and simple prostatectomy. After discussing the options the patient has elected to proceed with medical therapy. I will see him back in 4-5 months. Risks/benefits/alternatives discussed.   No follow-ups on file.  Johnie Nailer, MD

## 2024-02-27 DIAGNOSIS — D473 Essential (hemorrhagic) thrombocythemia: Secondary | ICD-10-CM | POA: Diagnosis not present

## 2024-03-03 ENCOUNTER — Encounter: Payer: Self-pay | Admitting: Urology

## 2024-03-03 NOTE — Patient Instructions (Signed)

## 2024-03-04 DIAGNOSIS — K219 Gastro-esophageal reflux disease without esophagitis: Secondary | ICD-10-CM | POA: Diagnosis not present

## 2024-03-04 DIAGNOSIS — E7849 Other hyperlipidemia: Secondary | ICD-10-CM | POA: Diagnosis not present

## 2024-03-04 DIAGNOSIS — R030 Elevated blood-pressure reading, without diagnosis of hypertension: Secondary | ICD-10-CM | POA: Diagnosis not present

## 2024-03-04 DIAGNOSIS — D519 Vitamin B12 deficiency anemia, unspecified: Secondary | ICD-10-CM | POA: Diagnosis not present

## 2024-03-04 DIAGNOSIS — Z0001 Encounter for general adult medical examination with abnormal findings: Secondary | ICD-10-CM | POA: Diagnosis not present

## 2024-03-04 DIAGNOSIS — E039 Hypothyroidism, unspecified: Secondary | ICD-10-CM | POA: Diagnosis not present

## 2024-03-04 DIAGNOSIS — Z131 Encounter for screening for diabetes mellitus: Secondary | ICD-10-CM | POA: Diagnosis not present

## 2024-03-04 DIAGNOSIS — E559 Vitamin D deficiency, unspecified: Secondary | ICD-10-CM | POA: Diagnosis not present

## 2024-03-05 DIAGNOSIS — M48061 Spinal stenosis, lumbar region without neurogenic claudication: Secondary | ICD-10-CM | POA: Diagnosis not present

## 2024-03-05 DIAGNOSIS — Z6832 Body mass index (BMI) 32.0-32.9, adult: Secondary | ICD-10-CM | POA: Diagnosis not present

## 2024-03-11 DIAGNOSIS — R0789 Other chest pain: Secondary | ICD-10-CM | POA: Diagnosis not present

## 2024-03-11 DIAGNOSIS — Z23 Encounter for immunization: Secondary | ICD-10-CM | POA: Diagnosis not present

## 2024-03-11 DIAGNOSIS — Z6832 Body mass index (BMI) 32.0-32.9, adult: Secondary | ICD-10-CM | POA: Diagnosis not present

## 2024-03-11 DIAGNOSIS — R030 Elevated blood-pressure reading, without diagnosis of hypertension: Secondary | ICD-10-CM | POA: Diagnosis not present

## 2024-03-11 DIAGNOSIS — H6122 Impacted cerumen, left ear: Secondary | ICD-10-CM | POA: Diagnosis not present

## 2024-03-11 DIAGNOSIS — Z8249 Family history of ischemic heart disease and other diseases of the circulatory system: Secondary | ICD-10-CM | POA: Diagnosis not present

## 2024-03-11 DIAGNOSIS — E039 Hypothyroidism, unspecified: Secondary | ICD-10-CM | POA: Diagnosis not present

## 2024-03-18 DIAGNOSIS — Z6833 Body mass index (BMI) 33.0-33.9, adult: Secondary | ICD-10-CM | POA: Diagnosis not present

## 2024-03-18 DIAGNOSIS — S93401A Sprain of unspecified ligament of right ankle, initial encounter: Secondary | ICD-10-CM | POA: Diagnosis not present

## 2024-03-18 DIAGNOSIS — S92421A Displaced fracture of distal phalanx of right great toe, initial encounter for closed fracture: Secondary | ICD-10-CM | POA: Diagnosis not present

## 2024-03-18 DIAGNOSIS — S8001XA Contusion of right knee, initial encounter: Secondary | ICD-10-CM | POA: Diagnosis not present

## 2024-04-02 NOTE — Progress Notes (Deleted)
 Office Visit Note  Patient: Alfred Jones             Date of Birth: 11-14-1950           MRN: [id number]             PCP: Alston Jerry, MD Referring: Alston Jerry, MD Visit Date: 04/16/2024 Occupation: @GUAROCC @  Subjective:  No chief complaint on file.   History of Present Illness: Alfred Jones is a 73 y.o. male ***     Activities of Daily Living:  Patient reports morning stiffness for *** {minute/hour:19697}.   Patient {ACTIONS;DENIES/REPORTS:21021675::Denies} nocturnal pain.  Difficulty dressing/grooming: {ACTIONS;DENIES/REPORTS:21021675::Denies} Difficulty climbing stairs: {ACTIONS;DENIES/REPORTS:21021675::Denies} Difficulty getting out of chair: {ACTIONS;DENIES/REPORTS:21021675::Denies} Difficulty using hands for taps, buttons, cutlery, and/or writing: {ACTIONS;DENIES/REPORTS:21021675::Denies}  No Rheumatology ROS completed.   PMFS History:  Patient Active Problem List   Diagnosis Date Noted   Palpitations 01/03/2024   Cardiac chest pain 10/02/2022   Hypoglycemia 10/02/2022   History of dysarthria 10/02/2022   Hematochezia 05/17/2020   Chemotherapy follow-up examination 01/27/2020   Essential thrombocythemia (HCC) 01/27/2020   Erythromelalgia (HCC) 01/04/2020   Psoriasis 12/13/2019   Family history of coronary artery disease in brother 04/28/2019   Impotence of organic origin 04/28/2019   Pain in joint, multiple sites 04/28/2019   Occlusion and stenosis of carotid artery 04/28/2019   Other specified disorders of male genital organs 04/28/2019   Psoriatic arthropathy (HCC) 04/28/2019   Vitamin D deficiency 04/28/2019   Pain in joint 03/25/2019   Myalgia and myositis 03/25/2019   Other, multiple, and unspecified sites, insect bite, nonvenomous 03/25/2019   Herniated cervical disc without myelopathy 09/23/2018   Cervical spinal stenosis 06/02/2018   Gait abnormality 05/01/2018   Spinal stenosis of lumbar region 05/01/2018   Chronic  right-sided low back pain with right-sided sciatica 04/15/2018   Right groin pain 04/15/2018   Edema 06/04/2017   Arthropathy, multiple sites 03/15/2017   Wheezing 12/12/2016   Other malaise and fatigue 11/04/2016   Paresthesia 09/18/2016   Mononeuritis 09/04/2016   Disturbance of skin sensation 09/03/2016   Pain in soft tissues of limb 09/03/2016   Other atopic dermatitis and related conditions 07/22/2016   Sleep apnea 07/17/2016   Other dyspnea and respiratory abnormality 04/08/2016   Hyperlipidemia 12/06/2015   BPH (benign prostatic hyperplasia) 12/06/2015   Depression 12/06/2015   Obesity, unspecified 12/06/2015   GERD 08/31/2010   Constipation 08/31/2010   DYSPHAGIA 08/31/2010   ABDOMINAL PAIN-MULTIPLE SITES 08/31/2010    Past Medical History:  Diagnosis Date   Acid reflux    Anxiety    Arthritis    RHEUMATOID OR PSORIATIC ?   BCC (basal cell carcinoma of skin) 06/19/2007   left lower chest (CX35FU)   BCC (basal cell carcinoma of skin) infiltrated 03/11/2018   right nose (MOHS)   BCC (basal cell carcinoma of skin) ulcerated 07/11/2010   left cheek    Blood dyscrasia    Essential Thrombocythemia   Cancer (HCC)    Depression    Enlarged prostate    Hyperlipidemia    Hypertension 10/2023   Hypothyroidism    Numbness    right foot   Pneumonia    x 1   Pre-diabetes    no meds   Seasonal allergies    Sleep apnea 2017   uses CPAP nightly   Superficial basal cell carcinoma (BCC) 03/11/2018   left shoulder     Family History  Problem Relation Age of Onset  Heart disease Mother    Diabetes Mother    Heart attack Mother    Heart disease Father    Diabetes Father    Diabetes Sister    Seizures Sister    Diabetes Brother    Heart attack Brother    Sleep apnea Brother    Heart attack Brother    Sleep apnea Brother    Heart attack Brother    Diabetes Brother    Heart attack Brother    Hypothyroidism Daughter    Hypothyroidism Son    Colon cancer Neg Hx     Past Surgical History:  Procedure Laterality Date   ANTERIOR CERVICAL DECOMP/DISCECTOMY FUSION N/A 09/23/2018   Procedure: Cervical Three-Four Cervical Four-Five Cervical Five-Six Anterior cervical decompression/discectomy/fusion;  Surgeon: Manya Sells, MD;  Location: [facility] OR;  Service: Neurosurgery;  Laterality: N/A;  Cervical Three-Four Cervical Four-Five Cervical Five-Six Anterior cervical decompression/discectomy/fusion   CARDIAC CATHETERIZATION     Greater than 30 YRS AGO AT [facility]   CATARACT EXTRACTION Bilateral 2023   COLONOSCOPY WITH PROPOFOL  N/A 06/23/2020   One small polyp at hepatic flexure that was sessile and ablated without specimen for pathologist. Surveillance 10 years if overall health permits.   CYSTOSCOPY WITH INSERTION OF UROLIFT N/A 08/26/2023   Procedure: CYSTOSCOPY WITH INSERTION OF UROLIFT;  Surgeon: Marco Severs, MD;  Location: [facility];  Service: Urology;  Laterality: N/A;   ESOPHAGOGASTRODUODENOSCOPY (EGD) WITH PROPOFOL  N/A 08/13/2019   prominent Schatzki ring s/p dilation, overlying erosive reflux esophagitis, small hiatal hernia   EYE SURGERY     METAL REMOVED FROM LEFT EYE  (IN OFFICE)   EYE SURGERY Left 10/2021   retinal tear   LUMBAR LAMINECTOMY/DECOMPRESSION MICRODISCECTOMY Left 12/16/2023   Procedure: Decompressive Lumbar Laminectomy - left - Lumbar three-Lumbar four;  Surgeon: Gearl Keens, MD;  Location: [facility] OR;  Service: Neurosurgery;  Laterality: Left;   MALONEY DILATION N/A 08/13/2019   Procedure: Londa Rival DILATION;  Surgeon: Suzette Espy, MD;  [facility];  Service: Endoscopy;  Laterality: N/A;   MOLE REMOVAL     POLYPECTOMY  06/23/2020   Procedure: POLYPECTOMY;  Surgeon: Suzette Espy, MD;  [facility];  Service: Endoscopy;;   POSTERIOR FUSION PEDICLE SCREW PLACEMENT N/A 12/16/2023   Procedure: Posterior Lumbar Interbody Fusion - Lumbar four-Lumbar five Posterior Lateral and Interbody fusion;  Surgeon: Gearl Keens, MD;   Location: [facility] OR;  Service: Neurosurgery;  Laterality: N/A;   TONSILLECTOMY     as a child   Social History   Social History Narrative   Lives at home with his wife and son.   Right-handed.   2-4 cups caffeine per day.   Immunization History  Administered Date(s) Administered   Influenza Split 07/26/2015   Tdap 10/05/2002     Objective: Vital Signs: There were no vitals taken for this visit.   Physical Exam   Musculoskeletal Exam: ***  CDAI Exam: CDAI Score: -- Patient Global: --; Provider Global: -- Swollen: --; Tender: -- Joint Exam 04/16/2024   No joint exam has been documented for this visit   There is currently no information documented on the homunculus. Go to the Rheumatology activity and complete the homunculus joint exam.  Investigation: No additional findings.  Imaging: No results found.  Recent Labs: Lab Results  Component Value Date   WBC 8.4 12/30/2023   HGB 11.3 (L) 12/30/2023   PLT 606 (H) 12/30/2023   NA 143 12/30/2023   K 4.1 12/30/2023   CL  107 12/30/2023   CO2 26 12/30/2023   GLUCOSE 107 (H) 12/30/2023   BUN 14 12/30/2023   CREATININE 1.00 12/30/2023   BILITOT 0.5 09/03/2023   ALKPHOS 56 04/15/2018   AST 14 09/03/2023   ALT 11 09/03/2023   PROT 6.4 09/03/2023   ALBUMIN  4.5 04/15/2018   CALCIUM  9.2 12/30/2023   GFRAA 78 12/08/2020    Speciality Comments: PLQ Eye Exam 03/04/2023 WNL Groat f/u 1 year  Methotrexate 2 years 09/18 -no response  Otezla  1 year no response treated at [facility] rheumatology for possible psoriatic arthritis  Procedures:  No procedures performed Allergies: Patient has no known allergies.   Assessment / Plan:     Visit Diagnoses: Inflammatory arthritis  High risk medication use  Primary osteoarthritis of both hands  Primary osteoarthritis of both knees  Primary osteoarthritis of both feet  DDD (degenerative disc disease), cervical  Degeneration of intervertebral disc of lumbar region without  discogenic back pain or lower extremity pain  Neuropathy  Family history of psoriasis  Essential thrombocythemia (HCC)  Erythromelalgia (HCC)  History of gastroesophageal reflux (GERD)  History of hyperlipidemia  Vitamin D deficiency  Recurrent squamous cell carcinoma in situ of skin of cheek  Other insomnia  S/P lumbar fusion  Orders: No orders of the defined types were placed in this encounter.  No orders of the defined types were placed in this encounter.   Face-to-face time spent with patient was *** minutes. Greater than 50% of time was spent in counseling and coordination of care.  Follow-Up Instructions: No follow-ups on file.   Romayne Clubs, PA-C  Note - This record has been created using Dragon software.  Chart creation errors have been sought, but may not always  have been located. Such creation errors do not reflect on  the standard of medical care.

## 2024-04-03 DIAGNOSIS — L4050 Arthropathic psoriasis, unspecified: Secondary | ICD-10-CM | POA: Diagnosis not present

## 2024-04-03 DIAGNOSIS — Z6832 Body mass index (BMI) 32.0-32.9, adult: Secondary | ICD-10-CM | POA: Diagnosis not present

## 2024-04-03 DIAGNOSIS — M2550 Pain in unspecified joint: Secondary | ICD-10-CM | POA: Diagnosis not present

## 2024-04-03 DIAGNOSIS — I10 Essential (primary) hypertension: Secondary | ICD-10-CM | POA: Diagnosis not present

## 2024-04-03 DIAGNOSIS — S92421A Displaced fracture of distal phalanx of right great toe, initial encounter for closed fracture: Secondary | ICD-10-CM | POA: Diagnosis not present

## 2024-04-16 ENCOUNTER — Ambulatory Visit: Admitting: Physician Assistant

## 2024-04-16 DIAGNOSIS — Z79899 Other long term (current) drug therapy: Secondary | ICD-10-CM

## 2024-04-16 DIAGNOSIS — Z8719 Personal history of other diseases of the digestive system: Secondary | ICD-10-CM

## 2024-04-16 DIAGNOSIS — M19072 Primary osteoarthritis, left ankle and foot: Secondary | ICD-10-CM

## 2024-04-16 DIAGNOSIS — D473 Essential (hemorrhagic) thrombocythemia: Secondary | ICD-10-CM

## 2024-04-16 DIAGNOSIS — M19041 Primary osteoarthritis, right hand: Secondary | ICD-10-CM

## 2024-04-16 DIAGNOSIS — Z981 Arthrodesis status: Secondary | ICD-10-CM

## 2024-04-16 DIAGNOSIS — D0439 Carcinoma in situ of skin of other parts of face: Secondary | ICD-10-CM

## 2024-04-16 DIAGNOSIS — Z8639 Personal history of other endocrine, nutritional and metabolic disease: Secondary | ICD-10-CM

## 2024-04-16 DIAGNOSIS — M170 Bilateral primary osteoarthritis of knee: Secondary | ICD-10-CM

## 2024-04-16 DIAGNOSIS — G629 Polyneuropathy, unspecified: Secondary | ICD-10-CM

## 2024-04-16 DIAGNOSIS — M5030 Other cervical disc degeneration, unspecified cervical region: Secondary | ICD-10-CM

## 2024-04-16 DIAGNOSIS — M1990 Unspecified osteoarthritis, unspecified site: Secondary | ICD-10-CM

## 2024-04-16 DIAGNOSIS — Z840 Family history of diseases of the skin and subcutaneous tissue: Secondary | ICD-10-CM

## 2024-04-16 DIAGNOSIS — G4709 Other insomnia: Secondary | ICD-10-CM

## 2024-04-16 DIAGNOSIS — E559 Vitamin D deficiency, unspecified: Secondary | ICD-10-CM

## 2024-04-16 DIAGNOSIS — I7381 Erythromelalgia: Secondary | ICD-10-CM
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
## 2024-04-27 DIAGNOSIS — L4050 Arthropathic psoriasis, unspecified: Secondary | ICD-10-CM | POA: Diagnosis not present

## 2024-04-27 DIAGNOSIS — M19079 Primary osteoarthritis, unspecified ankle and foot: Secondary | ICD-10-CM | POA: Diagnosis not present

## 2024-04-27 DIAGNOSIS — Z6832 Body mass index (BMI) 32.0-32.9, adult: Secondary | ICD-10-CM | POA: Diagnosis not present

## 2024-04-27 DIAGNOSIS — S92421A Displaced fracture of distal phalanx of right great toe, initial encounter for closed fracture: Secondary | ICD-10-CM | POA: Diagnosis not present

## 2024-04-27 DIAGNOSIS — L608 Other nail disorders: Secondary | ICD-10-CM | POA: Diagnosis not present

## 2024-04-30 ENCOUNTER — Ambulatory Visit: Attending: Physician Assistant | Admitting: Physician Assistant

## 2024-04-30 ENCOUNTER — Encounter: Payer: Self-pay | Admitting: Physician Assistant

## 2024-04-30 ENCOUNTER — Telehealth: Payer: Self-pay

## 2024-04-30 VITALS — BP 121/67 | HR 89 | Resp 16 | Ht 71.0 in | Wt 228.0 lb

## 2024-04-30 DIAGNOSIS — Z981 Arthrodesis status: Secondary | ICD-10-CM | POA: Insufficient documentation

## 2024-04-30 DIAGNOSIS — G8929 Other chronic pain: Secondary | ICD-10-CM | POA: Insufficient documentation

## 2024-04-30 DIAGNOSIS — Z840 Family history of diseases of the skin and subcutaneous tissue: Secondary | ICD-10-CM | POA: Diagnosis present

## 2024-04-30 DIAGNOSIS — M19042 Primary osteoarthritis, left hand: Secondary | ICD-10-CM | POA: Insufficient documentation

## 2024-04-30 DIAGNOSIS — M25562 Pain in left knee: Secondary | ICD-10-CM | POA: Insufficient documentation

## 2024-04-30 DIAGNOSIS — M19072 Primary osteoarthritis, left ankle and foot: Secondary | ICD-10-CM | POA: Diagnosis not present

## 2024-04-30 DIAGNOSIS — M19041 Primary osteoarthritis, right hand: Secondary | ICD-10-CM | POA: Diagnosis not present

## 2024-04-30 DIAGNOSIS — I7381 Erythromelalgia: Secondary | ICD-10-CM | POA: Diagnosis present

## 2024-04-30 DIAGNOSIS — M170 Bilateral primary osteoarthritis of knee: Secondary | ICD-10-CM | POA: Diagnosis not present

## 2024-04-30 DIAGNOSIS — G4709 Other insomnia: Secondary | ICD-10-CM | POA: Insufficient documentation

## 2024-04-30 DIAGNOSIS — M19071 Primary osteoarthritis, right ankle and foot: Secondary | ICD-10-CM | POA: Diagnosis not present

## 2024-04-30 DIAGNOSIS — M1990 Unspecified osteoarthritis, unspecified site: Secondary | ICD-10-CM | POA: Diagnosis not present

## 2024-04-30 DIAGNOSIS — Z8639 Personal history of other endocrine, nutritional and metabolic disease: Secondary | ICD-10-CM | POA: Diagnosis not present

## 2024-04-30 DIAGNOSIS — Z8719 Personal history of other diseases of the digestive system: Secondary | ICD-10-CM | POA: Insufficient documentation

## 2024-04-30 DIAGNOSIS — D473 Essential (hemorrhagic) thrombocythemia: Secondary | ICD-10-CM | POA: Diagnosis present

## 2024-04-30 DIAGNOSIS — M5030 Other cervical disc degeneration, unspecified cervical region: Secondary | ICD-10-CM | POA: Insufficient documentation

## 2024-04-30 DIAGNOSIS — Z79899 Other long term (current) drug therapy: Secondary | ICD-10-CM | POA: Diagnosis not present

## 2024-04-30 DIAGNOSIS — G629 Polyneuropathy, unspecified: Secondary | ICD-10-CM | POA: Diagnosis present

## 2024-04-30 DIAGNOSIS — D0439 Carcinoma in situ of skin of other parts of face: Secondary | ICD-10-CM | POA: Diagnosis not present

## 2024-04-30 DIAGNOSIS — E559 Vitamin D deficiency, unspecified: Secondary | ICD-10-CM | POA: Diagnosis not present

## 2024-04-30 MED ORDER — LIDOCAINE HCL 1 % IJ SOLN
1.5000 mL | INTRAMUSCULAR | Status: AC | PRN
Start: 2024-04-30 — End: 2024-04-30
  Administered 2024-04-30: 1.5 mL

## 2024-04-30 MED ORDER — TRIAMCINOLONE ACETONIDE 40 MG/ML IJ SUSP
40.0000 mg | INTRAMUSCULAR | Status: AC | PRN
  Administered 2024-04-30: 40 mg via INTRA_ARTICULAR

## 2024-04-30 NOTE — Telephone Encounter (Signed)
VOB submitted for Orthovisc, Left knee(s) BV pending 

## 2024-04-30 NOTE — Telephone Encounter (Signed)
 Please apply for Visco injections Left knee , per Waddell Craze

## 2024-05-01 ENCOUNTER — Ambulatory Visit: Payer: Self-pay | Admitting: Physician Assistant

## 2024-05-01 LAB — CBC WITH DIFFERENTIAL/PLATELET
Absolute Lymphocytes: 2056 {cells}/uL (ref 850–3900)
Absolute Monocytes: 883 {cells}/uL (ref 200–950)
Basophils Absolute: 69 {cells}/uL (ref 0–200)
Basophils Relative: 1 %
Eosinophils Absolute: 152 {cells}/uL (ref 15–500)
Eosinophils Relative: 2.2 %
HCT: 32.4 % — ABNORMAL LOW (ref 38.5–50.0)
Hemoglobin: 10.9 g/dL — ABNORMAL LOW (ref 13.2–17.1)
MCH: 36.7 pg — ABNORMAL HIGH (ref 27.0–33.0)
MCHC: 33.6 g/dL (ref 32.0–36.0)
MCV: 109.1 fL — ABNORMAL HIGH (ref 80.0–100.0)
MPV: 9.3 fL (ref 7.5–12.5)
Monocytes Relative: 12.8 %
Neutro Abs: 3740 {cells}/uL (ref 1500–7800)
Neutrophils Relative %: 54.2 %
Platelets: 406 Thousand/uL — ABNORMAL HIGH (ref 140–400)
RBC: 2.97 Million/uL — ABNORMAL LOW (ref 4.20–5.80)
RDW: 15.1 % — ABNORMAL HIGH (ref 11.0–15.0)
Total Lymphocyte: 29.8 %
WBC: 6.9 Thousand/uL (ref 3.8–10.8)

## 2024-05-01 LAB — COMPREHENSIVE METABOLIC PANEL WITH GFR
AG Ratio: 2.2 (calc) (ref 1.0–2.5)
ALT: 14 U/L (ref 9–46)
AST: 16 U/L (ref 10–35)
Albumin: 4.4 g/dL (ref 3.6–5.1)
Alkaline phosphatase (APISO): 68 U/L (ref 35–144)
BUN/Creatinine Ratio: 15 (calc) (ref 6–22)
BUN: 23 mg/dL (ref 7–25)
CO2: 27 mmol/L (ref 20–32)
Calcium: 8.8 mg/dL (ref 8.6–10.3)
Chloride: 104 mmol/L (ref 98–110)
Creat: 1.56 mg/dL — ABNORMAL HIGH (ref 0.70–1.28)
Globulin: 2 g/dL (ref 1.9–3.7)
Glucose, Bld: 112 mg/dL — ABNORMAL HIGH (ref 65–99)
Potassium: 4.5 mmol/L (ref 3.5–5.3)
Sodium: 139 mmol/L (ref 135–146)
Total Bilirubin: 0.4 mg/dL (ref 0.2–1.2)
Total Protein: 6.4 g/dL (ref 6.1–8.1)
eGFR: 47 mL/min/1.73m2 — ABNORMAL LOW (ref >=60)

## 2024-05-01 NOTE — Progress Notes (Signed)
 RBC count, hemoglobin, and hematocrit are low and continue to trend down.  May be related to abnormal renal function. Platelets remain borderline elevated but have improved. Please see if folate and vitamin B12 can be added. Glucose 112.  Creatinine was elevated at 1.56 and GFR is low at 47.  Rest of CMP within normal limits. Please discuss the concern for Celebrex  use given drop in GFR.  Patient should try to reduce the dose of Celebrex  or discontinue altogether. Recommend updating BMP with GFR in 2 to 3 weeks.  Increase water  intake prior to next lab draw.

## 2024-05-04 NOTE — Telephone Encounter (Signed)
 Please call to schedule visco injections.  Approved for Orthovisc, Left knee(s). [employer] Deductible has been met $257(met: $257)  no co-pay Deductible does apply No pre-certifications  Confirmed wit: [employer] (ref: [name] Burman Jones (REF: [id number])

## 2024-05-11 NOTE — Telephone Encounter (Signed)
Lvm to schedule injections

## 2024-05-19 DIAGNOSIS — L570 Actinic keratosis: Secondary | ICD-10-CM | POA: Diagnosis not present

## 2024-05-19 DIAGNOSIS — B351 Tinea unguium: Secondary | ICD-10-CM | POA: Diagnosis not present

## 2024-05-19 DIAGNOSIS — L608 Other nail disorders: Secondary | ICD-10-CM | POA: Diagnosis not present

## 2024-05-19 DIAGNOSIS — X32XXXD Exposure to sunlight, subsequent encounter: Secondary | ICD-10-CM | POA: Diagnosis not present

## 2024-06-09 DIAGNOSIS — M4316 Spondylolisthesis, lumbar region: Secondary | ICD-10-CM | POA: Diagnosis not present

## 2024-06-09 DIAGNOSIS — M48061 Spinal stenosis, lumbar region without neurogenic claudication: Secondary | ICD-10-CM | POA: Diagnosis not present

## 2024-06-23 DIAGNOSIS — D473 Essential (hemorrhagic) thrombocythemia: Secondary | ICD-10-CM | POA: Diagnosis not present

## 2024-06-23 DIAGNOSIS — D649 Anemia, unspecified: Secondary | ICD-10-CM | POA: Diagnosis not present

## 2024-06-24 ENCOUNTER — Other Ambulatory Visit: Payer: Self-pay | Admitting: Internal Medicine

## 2024-06-29 ENCOUNTER — Ambulatory Visit (INDEPENDENT_AMBULATORY_CARE_PROVIDER_SITE_OTHER): Admitting: Urology

## 2024-06-29 ENCOUNTER — Encounter: Payer: Self-pay | Admitting: Urology

## 2024-06-29 VITALS — BP 105/62 | HR 79

## 2024-06-29 DIAGNOSIS — R3912 Poor urinary stream: Secondary | ICD-10-CM | POA: Diagnosis not present

## 2024-06-29 DIAGNOSIS — N411 Chronic prostatitis: Secondary | ICD-10-CM

## 2024-06-29 DIAGNOSIS — N401 Enlarged prostate with lower urinary tract symptoms: Secondary | ICD-10-CM | POA: Diagnosis not present

## 2024-06-29 DIAGNOSIS — N138 Other obstructive and reflux uropathy: Secondary | ICD-10-CM

## 2024-06-29 MED ORDER — ALFUZOSIN HCL ER 10 MG PO TB24: 10.0000 mg | ORAL_TABLET | Freq: Every day | ORAL | 11 refills | Status: AC

## 2024-06-29 NOTE — Progress Notes (Signed)
 06/29/2024 3:52 PM   Alfred Jones 04/19/1951 [id number]  Referring provider: Lari Elspeth BRAVO, MD [address] [place],  [place] [address]  Followup BPH   HPI: Alfred Jones is a 73yo here for followup for BPH. He is currently on uroxatral  10mg  at bedtime IPSS 18 QOL 4. His urine stream is weak. He has intermittent straining to urinate. He had a previous Urolift and has residual apical obstructing tissue on cystoscopy   PMH: Past Medical History:  Diagnosis Date   Acid reflux    Anxiety    Arthritis    RHEUMATOID OR PSORIATIC ?   BCC (basal cell carcinoma of skin) 06/19/2007   left lower chest (CX35FU)   BCC (basal cell carcinoma of skin) infiltrated 03/11/2018   right nose (MOHS)   BCC (basal cell carcinoma of skin) ulcerated 07/11/2010   left cheek    Blood dyscrasia    Essential Thrombocythemia   Cancer (HCC)    Depression    Enlarged prostate    Hyperlipidemia    Hypertension 10/2023   Hypothyroidism    Numbness    right foot   Pneumonia    x 1   Pre-diabetes    no meds   Seasonal allergies    Sleep apnea 2017   uses CPAP nightly   Superficial basal cell carcinoma (BCC) 03/11/2018   left shoulder     Surgical History: Past Surgical History:  Procedure Laterality Date   ANTERIOR CERVICAL DECOMP/DISCECTOMY FUSION N/A 09/23/2018   Procedure: Cervical Three-Four Cervical Four-Five Cervical Five-Six Anterior cervical decompression/discectomy/fusion;  Surgeon: Unice Pac, MD;  Location: [facility] OR;  Service: Neurosurgery;  Laterality: N/A;  Cervical Three-Four Cervical Four-Five Cervical Five-Six Anterior cervical decompression/discectomy/fusion   CARDIAC CATHETERIZATION     Greater than 30 YRS AGO AT [facility]   CATARACT EXTRACTION Bilateral 2023   COLONOSCOPY WITH PROPOFOL  N/A 06/23/2020   One small polyp at hepatic flexure that was sessile and ablated without specimen for pathologist. Surveillance 10 years if overall health permits.   CYSTOSCOPY WITH INSERTION OF  UROLIFT N/A 08/26/2023   Procedure: CYSTOSCOPY WITH INSERTION OF UROLIFT;  Surgeon: Sherrilee Belvie LITTIE, MD;  Location: [facility] OR[facility];  Service: Urology;  Laterality: N/A;   ESOPHAGOGASTRODUODENOSCOPY (EGD) WITH PROPOFOL  N/A 08/13/2019   prominent Schatzki ring s/p dilation, overlying erosive reflux esophagitis, small hiatal hernia   EYE SURGERY     METAL REMOVED FROM LEFT EYE  (IN OFFICE)   EYE SURGERY Left 10/2021   retinal tear   LUMBAR LAMINECTOMY/DECOMPRESSION MICRODISCECTOMY Left 12/16/2023   Procedure: Decompressive Lumbar Laminectomy - left - Lumbar three-Lumbar four;  Surgeon: Onetha Kuba, MD;  Location: [facility] OR;  Service: Neurosurgery;  Laterality: Left;   MALONEY DILATION N/A 08/13/2019   Procedure: AGAPITO DILATION;  Surgeon: Shaaron Alfred HERO, MD;  Location: [facility];  Service: Endoscopy;  Laterality: N/A;   MOLE REMOVAL     POLYPECTOMY  06/23/2020   Procedure: POLYPECTOMY;  Surgeon: Shaaron Alfred HERO, MD;  Location: [facility];  Service: Endoscopy;;   POSTERIOR FUSION PEDICLE SCREW PLACEMENT N/A 12/16/2023   Procedure: Posterior Lumbar Interbody Fusion - Lumbar four-Lumbar five Posterior Lateral and Interbody fusion;  Surgeon: Onetha Kuba, MD;  Location: [facility] OR;  Service: Neurosurgery;  Laterality: N/A;   TONSILLECTOMY     as a child    Home Medications:  Allergies as of 06/29/2024   No Known Allergies      Medication List        Accurate  as of June 29, 2024  3:52 PM. If you have any questions, ask your nurse or doctor.          acetaminophen  500 MG tablet Commonly known as: TYLENOL  Take 1,000 mg by mouth every 8 (eight) hours as needed for moderate pain (pain score 4-6).   alfuzosin  10 MG 24 hr tablet Commonly known as: UROXATRAL  Take 1 tablet (10 mg total) by mouth at bedtime.   aspirin  EC 81 MG tablet Take 81 mg by mouth daily. Swallow whole.   B-12 PO Take 1 capsule by mouth daily.   BEET ROOT PO Take by mouth.   BENGAY EX Apply 1 Application topically  daily as needed (back pain).   celecoxib  200 MG capsule Commonly known as: CELEBREX  Take 100 mg by mouth 2 (two) times daily.   cetirizine 10 MG tablet Commonly known as: ZYRTEC Take 10 mg by mouth at bedtime.   chlorthalidone 25 MG tablet Commonly known as: HYGROTON Take 25 mg by mouth daily.   CINNAMON  PO Take 1,200 mg by mouth daily.   clotrimazole 1 % cream Commonly known as: LOTRIMIN Apply 1 application topically at bedtime. Feet   Co-Enzyme Q-10 100 MG Caps Take 100 mg by mouth 2 (two) times daily.   diltiazem  60 MG tablet Commonly known as: Cardizem  Take 1 tablet (60 mg total) by mouth as needed (For HR over 130 bpm).   DSS 100 MG Caps 1 capsule as needed Orally Once a day for 30 day(s)   DULoxetine  60 MG capsule Commonly known as: CYMBALTA  Take 60 mg by mouth daily.   fexofenadine 180 MG tablet Commonly known as: ALLEGRA Take 180 mg by mouth daily.   Flax Seed Oil 1000 MG Caps Take 1,000 mg by mouth at bedtime.   folic acid  1 MG tablet Commonly known as: FOLVITE  Take 1 mg by mouth daily.   hydroxyurea  500 MG capsule Commonly known as: HYDREA  Take 500-1,000 mg by mouth See admin instructions. Take 1000 mg by mouth on Mon, Wed, and Fri. Take 500 daily on Tues, Thurs, Sat, and Sun   ketoconazole 2 % cream Commonly known as: NIZORAL Apply 1 application  topically daily as needed for irritation.   levothyroxine  50 MCG tablet Commonly known as: SYNTHROID  Take 50 mcg by mouth daily before breakfast.   Linzess  290 MCG Caps capsule Generic drug: linaclotide  TAKE 1 CAPSULE BY MOUTH EVERY DAY BEFOREBREAKFAST   lisinopril  20 MG tablet Commonly known as: ZESTRIL  Take 20 mg by mouth daily.   Melatonin 10 MG Caps Take 10 mg by mouth at bedtime.   NON FORMULARY Pt uses a cpap nightly   OVER THE COUNTER MEDICATION Take 1 capsule by mouth daily. Super Fruits supplement   OVER THE COUNTER MEDICATION Take 1 capsule by mouth daily. Super Vegetables  supplement   Oxycodone  HCl 10 MG Tabs Take 10 mg by mouth 3 (three) times daily as needed (pain).   oxyCODONE  5 MG immediate release tablet Commonly known as: Oxy IR/ROXICODONE  Take 1-2 tablets (5-10 mg total) by mouth every 4 (four) hours as needed (pain).   pantoprazole  40 MG tablet Commonly known as: PROTONIX  Take 40 mg by mouth 2 (two) times daily.   pregabalin  50 MG capsule Commonly known as: LYRICA  Take 50 mg by mouth 2 (two) times daily.   REFRESH OP Place 1 drop into both eyes daily as needed (dry eyes).   rosuvastatin  5 MG tablet Commonly known as: CRESTOR  Take 5 mg by mouth 2 (  two) times a week. Sun and Wed night   senna-docusate 8.6-50 MG tablet Commonly known as: Senokot-S Take 1 tablet by mouth 2 (two) times daily.   sulfaSALAzine  500 MG tablet Commonly known as: AZULFIDINE  Take 2 tablets (1,000 mg total) by mouth 2 (two) times daily.   tadalafil  5 MG tablet Commonly known as: CIALIS  Take 1 tablet (5 mg total) by mouth daily.   tiZANidine  4 MG tablet Commonly known as: ZANAFLEX  Take 4 mg by mouth at bedtime as needed for muscle spasms.   traZODone  50 MG tablet Commonly known as: DESYREL  Take 50 mg by mouth at bedtime.   trolamine salicylate 10 % cream Commonly known as: ASPERCREME Apply 1 Application topically at bedtime.   TURMERIC PO Take 1 tablet by mouth at bedtime.   VITAMIN D PO Take 1 capsule by mouth daily.        Allergies: No Known Allergies  Family History: Family History  Problem Relation Age of Onset   Heart disease Mother    Diabetes Mother    Heart attack Mother    Heart disease Father    Diabetes Father    Diabetes Sister    Seizures Sister    Diabetes Brother    Heart attack Brother    Sleep apnea Brother    Heart attack Brother    Sleep apnea Brother    Heart attack Brother    Diabetes Brother    Heart attack Brother    Hypothyroidism Daughter    Hypothyroidism Son    Colon cancer Neg Hx     Social  History:  reports that he quit smoking about 37 years ago. His smoking use included cigarettes. He started smoking about 53 years ago. He has a 16 pack-year smoking history. He has been exposed to tobacco smoke. He has never used smokeless tobacco. He reports that he does not drink alcohol  and does not use drugs.  ROS: All other review of systems were reviewed and are negative except what is noted above in HPI  Physical Exam: BP 105/62   Pulse 79   Constitutional:  Alert and oriented, No acute distress. HEENT: [place] AT, moist mucus membranes.  Trachea midline, no masses. Cardiovascular: No clubbing, cyanosis, or edema. Respiratory: Normal respiratory effort, no increased work of breathing. GI: Abdomen is soft, nontender, nondistended, no abdominal masses GU: No CVA tenderness.  Lymph: No cervical or inguinal lymphadenopathy. Skin: No rashes, bruises or suspicious lesions. Neurologic: Grossly intact, no focal deficits, moving all 4 extremities. Psychiatric: Normal mood and affect.  Laboratory Data: Lab Results  Component Value Date   WBC 6.9 04/30/2024   HGB 10.9 (L) 04/30/2024   HCT 32.4 (L) 04/30/2024   MCV 109.1 (H) 04/30/2024   PLT 406 (H) 04/30/2024    Lab Results  Component Value Date   CREATININE 1.56 (H) 04/30/2024    Lab Results  Component Value Date   PSA 1.12 12/02/2015    No results found for: TESTOSTERONE   Lab Results  Component Value Date   HGBA1C 5.2 04/15/2018    Urinalysis    Component Value Date/Time   APPEARANCEUR Clear 02/26/2024 1315   GLUCOSEU Negative 02/26/2024 1315   BILIRUBINUR Negative 02/26/2024 1315   PROTEINUR Negative 02/26/2024 1315   UROBILINOGEN 1.0 01/15/2020 1121   NITRITE Negative 02/26/2024 1315   LEUKOCYTESUR Negative 02/26/2024 1315    Lab Results  Component Value Date   LABMICR Comment 02/26/2024   WBCUA 0-5 10/03/2023   LABEPIT 0-10 10/03/2023  MUCUS Present 07/29/2020   BACTERIA None seen 10/03/2023     Pertinent Imaging:  Results for orders placed during the hospital encounter of 07/11/20  DG Abd 1 View  Narrative CLINICAL DATA:  Constipation  EXAM: ABDOMEN - 1 VIEW  COMPARISON:  X-ray abdomen 04/18/2018, CT abdomen pelvis 06/03/2012  FINDINGS: The bowel gas pattern is normal. Stool within the ascending and proximal transverse colon. No radio-opaque calculi or other significant radiographic abnormality are seen. Visualized osseous structures are grossly unremarkable.  IMPRESSION: Nonobstructive bowel gas pattern with stool within the ascending colon proximal transverse colon.   Electronically Signed By: Morgane  Naveau M.D. On: 07/11/2020 23:52  No results found for this or any previous visit.  No results found for this or any previous visit.  No results found for this or any previous visit.  No results found for this or any previous visit.  No results found for this or any previous visit.  No results found for this or any previous visit.  No results found for this or any previous visit.   Assessment & Plan:    1. Benign prostatic hyperplasia with urinary obstruction (Primary) We discussed the management of his BPH including continued medical therapy, Rezum, Urolift, TURP and simple prostatectomy. After discussing the options the patient has elected to proceed with Urolift. Risks/benefits/alternatives discussed.  - Urinalysis, Routine w reflex microscopic  2. Weak urinary stream -continue uroxatral  10mg  daily  3. Chronic prostatitis without hematuria -resolved   No follow-ups on file.  Belvie Clara, MD  [facility] [place]

## 2024-06-29 NOTE — Patient Instructions (Signed)

## 2024-06-30 DIAGNOSIS — Z79890 Hormone replacement therapy (postmenopausal): Secondary | ICD-10-CM | POA: Diagnosis not present

## 2024-06-30 DIAGNOSIS — Z79899 Other long term (current) drug therapy: Secondary | ICD-10-CM | POA: Diagnosis not present

## 2024-06-30 DIAGNOSIS — D473 Essential (hemorrhagic) thrombocythemia: Secondary | ICD-10-CM | POA: Diagnosis not present

## 2024-06-30 DIAGNOSIS — Z23 Encounter for immunization: Secondary | ICD-10-CM | POA: Diagnosis not present

## 2024-06-30 LAB — URINALYSIS, ROUTINE W REFLEX MICROSCOPIC
Bilirubin, UA: NEGATIVE
Glucose, UA: NEGATIVE
Ketones, UA: NEGATIVE
Leukocytes,UA: NEGATIVE
Nitrite, UA: NEGATIVE
Protein,UA: NEGATIVE
RBC, UA: NEGATIVE
Specific Gravity, UA: 1.025 (ref 1.005–1.030)
Urobilinogen, Ur: 1 mg/dL (ref 0.2–1.0)
pH, UA: 6 (ref 5.0–7.5)

## 2024-07-06 ENCOUNTER — Ambulatory Visit: Admitting: Physician Assistant

## 2024-07-13 ENCOUNTER — Ambulatory Visit: Admitting: Physician Assistant

## 2024-07-14 ENCOUNTER — Ambulatory Visit: Admitting: Internal Medicine

## 2024-07-20 ENCOUNTER — Ambulatory Visit: Attending: Physician Assistant | Admitting: Physician Assistant

## 2024-07-20 DIAGNOSIS — M1712 Unilateral primary osteoarthritis, left knee: Secondary | ICD-10-CM | POA: Insufficient documentation

## 2024-07-20 MED ORDER — HYALURONAN 30 MG/2ML IX SOSY
30.0000 mg | PREFILLED_SYRINGE | INTRA_ARTICULAR | Status: AC | PRN
  Administered 2024-07-20: 30 mg via INTRA_ARTICULAR

## 2024-07-20 MED ORDER — LIDOCAINE HCL 1 % IJ SOLN
1.5000 mL | INTRAMUSCULAR | Status: AC | PRN
  Administered 2024-07-20: 1.5 mL

## 2024-07-20 NOTE — Progress Notes (Signed)
   Procedure Note  Patient: Alfred Jones             Date of Birth: 05-07-1951           MRN: [id number]             Visit Date: 07/20/2024  Procedures: Visit Diagnoses:  1. Primary osteoarthritis of left knee     Orthovisc #1 left knee, B/B Large Joint Inj: L knee on 07/20/2024 2:59 PM Indications: pain Details: 25 G 1.5 in needle, medial approach  Arthrogram: No  Medications: 1.5 mL lidocaine  1 %; 30 mg Hyaluronan 30 MG/2ML Aspirate: 0 mL Outcome: tolerated well, no immediate complications Procedure, treatment alternatives, risks and benefits explained, specific risks discussed. Consent was given by the patient.       Patient tolerated the procedure well.  Aftercare was discussed.  Waddell Craze, PA-C

## 2024-07-21 ENCOUNTER — Encounter: Payer: Self-pay | Admitting: Internal Medicine

## 2024-07-21 ENCOUNTER — Ambulatory Visit: Admitting: Internal Medicine

## 2024-07-21 VITALS — BP 104/49 | HR 71 | Temp 97.8°F | Ht 70.0 in | Wt 222.0 lb

## 2024-07-21 DIAGNOSIS — K219 Gastro-esophageal reflux disease without esophagitis: Secondary | ICD-10-CM

## 2024-07-21 DIAGNOSIS — K5909 Other constipation: Secondary | ICD-10-CM | POA: Diagnosis not present

## 2024-07-21 MED ORDER — PANTOPRAZOLE SODIUM 40 MG PO TBEC: 40.0000 mg | DELAYED_RELEASE_TABLET | Freq: Two times a day (BID) | ORAL | 3 refills | Status: AC

## 2024-07-21 MED ORDER — LINACLOTIDE 290 MCG PO CAPS: 290.0000 ug | ORAL_CAPSULE | Freq: Every day | ORAL | 3 refills | Status: AC

## 2024-07-21 NOTE — Patient Instructions (Signed)
 It was good to see you again  Continue Linzess  290-1 capsule daily dispense 90 with 3 additional refills  Continue Protonix  40 mg orally twice daily.  Dispense 180 with 3 additional refills  Plan for colonoscopy 2031  Office visit here in 1 year

## 2024-07-27 ENCOUNTER — Ambulatory Visit: Attending: Physician Assistant | Admitting: Physician Assistant

## 2024-07-27 DIAGNOSIS — M1712 Unilateral primary osteoarthritis, left knee: Secondary | ICD-10-CM | POA: Diagnosis present

## 2024-07-27 MED ORDER — HYALURONAN 30 MG/2ML IX SOSY
30.0000 mg | PREFILLED_SYRINGE | INTRA_ARTICULAR | Status: AC | PRN
  Administered 2024-07-27: 30 mg via INTRA_ARTICULAR

## 2024-07-27 MED ORDER — LIDOCAINE HCL 1 % IJ SOLN
1.5000 mL | INTRAMUSCULAR | Status: AC | PRN
  Administered 2024-07-27: 1.5 mL

## 2024-07-27 NOTE — Progress Notes (Signed)
   Procedure Note  Patient: Alfred Jones             Date of Birth: October 27, 1950           MRN: [id number]             Visit Date: 07/27/2024  Procedures: Visit Diagnoses:  1. Primary osteoarthritis of left knee     Orthovisc #2 Left Knee B/B  Large Joint Inj: L knee on 07/27/2024 1:55 PM Indications: pain Details: 25 G 1.5 in needle, medial approach  Arthrogram: No  Medications: 1.5 mL lidocaine  1 %; 30 mg Hyaluronan 30 MG/2ML Aspirate: 0 mL Outcome: tolerated well, no immediate complications Procedure, treatment alternatives, risks and benefits explained, specific risks discussed. Consent was given by the patient.     Patient tolerated the procedure well. Aftercare was discussed.  Waddell Craze, PA-C

## 2024-07-28 ENCOUNTER — Telehealth: Payer: Self-pay

## 2024-07-28 DIAGNOSIS — M7022 Olecranon bursitis, left elbow: Secondary | ICD-10-CM

## 2024-07-28 NOTE — Addendum Note (Signed)
 Addended by: Dawnya Grams M on: 07/28/2024 12:59 PM   Modules accepted: Orders

## 2024-07-28 NOTE — Telephone Encounter (Signed)
Ok to place referral to orthopedics.

## 2024-07-28 NOTE — Telephone Encounter (Signed)
 Contacted patient and spoke with wife to advise that This is not related to the knee injection.   It is likely he has olecranon bursitis.  Recommend evaluation by orthopedics. She would like to know if a referral can be placed.

## 2024-07-28 NOTE — Telephone Encounter (Signed)
 Patients wife called the office stating that the patient had an injection in his left knee on 07/27/2024 and later on in the evening there patients left elbow started to swell and is the size of a golf ball. Wife states it was not like this before the injection in the knee and would like to know if that is related to the knee injection. Wife would like a call back. Please advise.

## 2024-07-28 NOTE — Telephone Encounter (Signed)
 This is not related to the knee injection.   It is likely he has olecranon bursitis.  Recommend evaluation by orthopedics

## 2024-08-02 NOTE — Progress Notes (Signed)
 Gastroenterology Progress Note    Primary Care Physician:  Lari Elspeth BRAVO, MD Primary Gastroenterologist:  Dr. Shaaron  Pre-Procedure History & Physical: HPI:  Alfred Jones is a 73 y.o. male here for follow-up of GERD/erosive esophagitis requires Protonix  40 mg twice daily ongoing.  No dysphagia since he had a Schatzki's ring dilated.  No Barrett's epithelium.  Constipation will managed with Linzess  290 daily.  Due for 1 more screening colonoscopy 2031.  Overall doing well from a GI standpoint  Past Medical History:  Diagnosis Date   Acid reflux    Anxiety    Arthritis    RHEUMATOID OR PSORIATIC ?   BCC (basal cell carcinoma of skin) 06/19/2007   left lower chest (CX35FU)   BCC (basal cell carcinoma of skin) infiltrated 03/11/2018   right nose (MOHS)   BCC (basal cell carcinoma of skin) ulcerated 07/11/2010   left cheek    Blood dyscrasia    Essential Thrombocythemia   Cancer (HCC)    Depression    Enlarged prostate    Hyperlipidemia    Hypertension 10/2023   Hypothyroidism    Numbness    right foot   Pneumonia    x 1   Pre-diabetes    no meds   Seasonal allergies    Sleep apnea 2017   uses CPAP nightly   Superficial basal cell carcinoma (BCC) 03/11/2018   left shoulder     Past Surgical History:  Procedure Laterality Date   ANTERIOR CERVICAL DECOMP/DISCECTOMY FUSION N/A 09/23/2018   Procedure: Cervical Three-Four Cervical Four-Five Cervical Five-Six Anterior cervical decompression/discectomy/fusion;  Surgeon: Unice Pac, MD;  Location: [facility] OR;  Service: Neurosurgery;  Laterality: N/A;  Cervical Three-Four Cervical Four-Five Cervical Five-Six Anterior cervical decompression/discectomy/fusion   CARDIAC CATHETERIZATION     Greater than 30 YRS AGO AT [facility]   CATARACT EXTRACTION Bilateral 2023   COLONOSCOPY WITH PROPOFOL  N/A 06/23/2020   One small polyp at hepatic flexure that was sessile and ablated without specimen for pathologist. Surveillance 10 years if  overall health permits.   CYSTOSCOPY WITH INSERTION OF UROLIFT N/A 08/26/2023   Procedure: CYSTOSCOPY WITH INSERTION OF UROLIFT;  Surgeon: Sherrilee Belvie LITTIE, MD;  Location: [facility] OR[facility];  Service: Urology;  Laterality: N/A;   ESOPHAGOGASTRODUODENOSCOPY (EGD) WITH PROPOFOL  N/A 08/13/2019   prominent Schatzki ring s/p dilation, overlying erosive reflux esophagitis, small hiatal hernia   EYE SURGERY     METAL REMOVED FROM LEFT EYE  (IN OFFICE)   EYE SURGERY Left 10/2021   retinal tear   LUMBAR LAMINECTOMY/DECOMPRESSION MICRODISCECTOMY Left 12/16/2023   Procedure: Decompressive Lumbar Laminectomy - left - Lumbar three-Lumbar four;  Surgeon: Onetha Kuba, MD;  Location: [facility] OR;  Service: Neurosurgery;  Laterality: Left;   MALONEY DILATION N/A 08/13/2019   Procedure: AGAPITO DILATION;  Surgeon: Shaaron Lamar HERO, MD;  Location: [facility] SUITE;  Service: Endoscopy;  Laterality: N/A;   MOLE REMOVAL     POLYPECTOMY  06/23/2020   Procedure: POLYPECTOMY;  Surgeon: Shaaron Lamar HERO, MD;  Location: [facility];  Service: Endoscopy;;   POSTERIOR FUSION PEDICLE SCREW PLACEMENT N/A 12/16/2023   Procedure: Posterior Lumbar Interbody Fusion - Lumbar four-Lumbar five Posterior Lateral and Interbody fusion;  Surgeon: Onetha Kuba, MD;  Location: [facility] OR;  Service: Neurosurgery;  Laterality: N/A;   TONSILLECTOMY     as a child    Prior to Admission medications   Medication Sig Start Date End Date Taking? Authorizing Provider  acetaminophen  (TYLENOL ) 500 MG tablet Take  1,000 mg by mouth every 8 (eight) hours as needed for moderate pain (pain score 4-6).   Yes [provider]  alfuzosin  (UROXATRAL ) 10 MG 24 hr tablet Take 1 tablet (10 mg total) by mouth at bedtime. 06/29/24  Yes McKenzie, Belvie CROME, MD  aspirin  EC 81 MG tablet Take 81 mg by mouth daily. Swallow whole.   Yes [provider]  cetirizine (ZYRTEC) 10 MG tablet Take 10 mg by mouth at bedtime.   Yes [provider]  chlorthalidone  (HYGROTON) 25 MG tablet Take 25 mg by mouth daily. 06/20/24  Yes [provider]  CINNAMON  PO Take 1,200 mg by mouth daily.   Yes [provider]  clotrimazole (LOTRIMIN) 1 % cream Apply 1 application topically at bedtime. Feet   Yes [provider]  Co-Enzyme Q-10 100 MG CAPS Take 100 mg by mouth 2 (two) times daily.   Yes [provider]  Cyanocobalamin  (B-12 PO) Take 1 capsule by mouth daily.   Yes [provider]  diltiazem  (CARDIZEM ) 60 MG tablet Take 1 tablet (60 mg total) by mouth as needed (For HR over 130 bpm). 01/03/24  Yes Mallipeddi, Vishnu P, MD  Docusate Sodium  (DSS) 100 MG CAPS 1 capsule as needed Orally Once a day for 30 day(s)   Yes [provider]  DULoxetine  (CYMBALTA ) 60 MG capsule Take 60 mg by mouth daily.   Yes [provider]  fexofenadine (ALLEGRA) 180 MG tablet Take 180 mg by mouth daily.   Yes [provider]  Flaxseed, Linseed, (FLAX SEED OIL) 1000 MG CAPS Take 1,000 mg by mouth at bedtime.   Yes [provider]  folic acid  (FOLVITE ) 1 MG tablet Take 1 mg by mouth daily.   Yes [provider]  hydroxyurea  (HYDREA ) 500 MG capsule Take 500-1,000 mg by mouth See admin instructions. Take 1000 mg by mouth on Mon, Wed, and Fri. Take 500 daily on Tues, Thurs, Sat, and Sun   Yes [provider]  ketoconazole (NIZORAL) 2 % cream Apply 1 application  topically daily as needed for irritation.   Yes [provider]  levothyroxine  (SYNTHROID ) 50 MCG tablet Take 50 mcg by mouth daily before breakfast. 09/04/22  Yes [provider]  lisinopril  (ZESTRIL ) 10 MG tablet Take 10 mg by mouth daily. 06/20/24  Yes [provider]  Melatonin 10 MG CAPS Take 10 mg by mouth at bedtime.    Yes [provider]  Menthol , Topical Analgesic, (BENGAY EX) Apply 1 Application topically daily as needed (back pain).   Yes [provider]  Misc Natural Products (BEET  ROOT PO) Take by mouth.   Yes [provider]  NON FORMULARY Pt uses a cpap nightly   Yes [provider]  OVER THE COUNTER MEDICATION Take 1 capsule by mouth daily. Super Fruits supplement   Yes [provider]  OVER THE COUNTER MEDICATION Take 1 capsule by mouth daily. Super Vegetables supplement   Yes [provider]  Oxycodone  HCl 10 MG TABS Take 10 mg by mouth 3 (three) times daily as needed (pain). 07/25/21  Yes [provider]  Polyvinyl Alcohol -Povidone (REFRESH OP) Place 1 drop into both eyes daily as needed (dry eyes).   Yes [provider]  pregabalin  (LYRICA ) 50 MG capsule Take 50 mg by mouth 2 (two) times daily. 01/24/22  Yes [provider]  rosuvastatin  (CRESTOR ) 5 MG tablet Take 5 mg by mouth 2 (two) times a week. [place] and  Wed night   Yes [provider]  senna-docusate (SENOKOT-S) 8.6-50 MG tablet Take 1 tablet by mouth 2 (two) times daily.   Yes [provider]  sulfaSALAzine  (AZULFIDINE ) 500 MG tablet Take 2 tablets (1,000 mg total) by mouth 2 (two) times daily. Patient taking differently: Take 500 mg by mouth 2 (two) times daily. 01/15/24  Yes Cheryl Waddell HERO, PA-C  tadalafil  (CIALIS ) 5 MG tablet Take 1 tablet (5 mg total) by mouth daily. 01/01/24  Yes McKenzie, Belvie CROME, MD  tiZANidine  (ZANAFLEX ) 4 MG tablet Take 4 mg by mouth at bedtime as needed for muscle spasms. 11/22/22  Yes [provider]  traZODone  (DESYREL ) 50 MG tablet Take 50 mg by mouth at bedtime.   Yes [provider]  trolamine salicylate (ASPERCREME) 10 % cream Apply 1 Application topically at bedtime.   Yes [provider]  TURMERIC PO Take 1 tablet by mouth at bedtime.   Yes [provider]  VITAMIN D PO Take 1 capsule by mouth daily.   Yes [provider]  celecoxib  (CELEBREX ) 100 MG capsule Take 100 mg by mouth 2 (two) times daily.    [provider]  linaclotide  (LINZESS ) 290 MCG  CAPS capsule Take 1 capsule (290 mcg total) by mouth daily before breakfast. 07/21/24   Arriel Victor, Lamar HERO, MD  pantoprazole  (PROTONIX ) 40 MG tablet Take 1 tablet (40 mg total) by mouth 2 (two) times daily. 07/21/24   Shaaron Lamar HERO, MD    Allergies as of 07/21/2024   (No Known Allergies)    Family History  Problem Relation Age of Onset   Heart disease Mother    Diabetes Mother    Heart attack Mother    Heart disease Father    Diabetes Father    Diabetes Sister    Seizures Sister    Diabetes Brother    Heart attack Brother    Sleep apnea Brother    Heart attack Brother    Sleep apnea Brother    Heart attack Brother    Diabetes Brother    Heart attack Brother    Hypothyroidism Daughter    Hypothyroidism Son    Colon cancer Neg Hx     Social History   Socioeconomic History   Marital status: Married    Spouse name: Not on file   Number of children: 2   Years of education: 12   Highest education level: Not on file  Occupational History   Occupation: Curator  Tobacco Use   Smoking status: Former    Current packs/day: 0.00    Average packs/day: 1 pack/day for 16.0 years (16.0 ttl pk-yrs)    Types: Cigarettes    Start date: 10/22/1970    Quit date: 10/22/1986    Years since quitting: 37.8    Passive exposure: Past (minimal)   Smokeless tobacco: Never  Vaping Use   Vaping status: Never Used  Substance and Sexual Activity   Alcohol  use: No   Drug use: No   Sexual activity: Yes  Other Topics Concern   Not on file  Social History Narrative   Lives at home with his wife and son.   Right-handed.   2-4 cups caffeine per day.   Social Drivers of Corporate investment banker Strain: Low Risk  (08/02/2022)   Received from [facility]   Overall Financial Resource Strain (CARDIA)    Difficulty of Paying Living Expenses: Not hard at all  Food Insecurity: No Food Insecurity (06/30/2024)  Received from [facility]   Hunger Vital Sign    Within the past 12 months, you  worried that your food would run out before you got the money to buy more.: Never true    Within the past 12 months, the food you bought just didn't last and you didn't have money to get more.: Never true  Transportation Needs: No Transportation Needs (06/30/2024)   Received from [facility] - Transportation    Lack of Transportation (Medical): No    Lack of Transportation (Non-Medical): No  Physical Activity: Inactive (08/02/2022)   Received [facility]   Exercise Vital Sign    On average, how many days per week do you engage in moderate to strenuous exercise (like a brisk walk)?: 0 days    On average, how many minutes do you engage in exercise at this level?: 0 min  Stress: No Stress Concern Present (08/02/2022)   Received from [facility] - Occupational Stress Questionnaire    Feeling of Stress : Not at all  Social Connections: Moderately Integrated (08/02/2022)   Received from [facility]   Social Connection and Isolation Panel    In a typical week, how many times do you talk on the phone with family, friends, or neighbors?: More than three times a week    How often do you get together with friends or relatives?: Three times a week    How often do you attend church or religious services?: More than 4 times per year    Do you belong to any clubs or organizations such as church groups, unions, fraternal or athletic groups, or school groups?: No    How often do you attend meetings of the clubs or organizations you belong to?: Never    Are you married, widowed, divorced, separated, never married, or living with a partner?: Married  Intimate Partner Violence: Patient Declined (07/03/2023)   Humiliation, Afraid, Rape, and Kick questionnaire    Fear of Current or Ex-Partner: Patient declined    Emotionally Abused: Patient declined    Physically Abused: Patient declined    Sexually Abused: Patient declined    Review  of Systems   See HPI, otherwise negative ROS  Physical Exam: BP (!) 104/49 (BP Location: Left Arm, Patient Position: Sitting, Cuff Size: Large)   Pulse 71   Temp 97.8 F (36.6 C) (Temporal)   Ht 5' 10 (1.778 m)   Wt 222 lb (100.7 kg)   SpO2 94%   BMI 31.85 kg/m  General:   Alert,  Well-developed, well-nourished, pleasant and cooperative in NAD Neck:  Supple; no masses or thyromegaly. No significant cervical adenopathy. Lungs:  Clear throughout to auscultation.   No wheezes, crackles, or rhonchi. No acute distress. Heart:  Regular rate and rhythm; no murmurs, clicks, rubs,  or gallops. Abdomen: Non-distended, normal bowel sounds.  Soft and nontender without appreciable mass or hepatosplenomegaly.    Impression/Plan:   73 year old gentleman with complicated GERD.  Symptoms well-controlled on twice daily PPI therapy.  No alarm features at this time.  History of chronic constipation now doing well on Linzess  290 daily.   Recommendations:  Continue Linzess  290-1 capsule daily dispense 90 with 3 additional refills  Continue Protonix  40 mg orally twice daily.  Dispense 180 with 3 additional refills  Plan for colonoscopy 2031  Office visit here in 1 year   Notice: This dictation was prepared with Dragon dictation  along with smaller phrase technology. Any transcriptional errors that result from this process are unintentional and may not be corrected upon review.

## 2024-08-03 ENCOUNTER — Ambulatory Visit: Attending: Physician Assistant | Admitting: Physician Assistant

## 2024-08-03 DIAGNOSIS — M1712 Unilateral primary osteoarthritis, left knee: Secondary | ICD-10-CM | POA: Insufficient documentation

## 2024-08-03 DIAGNOSIS — Z79899 Other long term (current) drug therapy: Secondary | ICD-10-CM | POA: Insufficient documentation

## 2024-08-03 MED ORDER — LIDOCAINE HCL 1 % IJ SOLN
1.5000 mL | INTRAMUSCULAR | Status: AC | PRN
  Administered 2024-08-03: 1.5 mL

## 2024-08-03 MED ORDER — HYALURONAN 30 MG/2ML IX SOSY
30.0000 mg | PREFILLED_SYRINGE | INTRA_ARTICULAR | Status: AC | PRN
  Administered 2024-08-03: 30 mg via INTRA_ARTICULAR

## 2024-08-03 NOTE — Progress Notes (Signed)
   Procedure Note  Patient: Alfred Jones             Date of Birth: 1951-04-23           MRN: [id number]             Visit Date: 08/03/2024  Procedures: Visit Diagnoses:  1. Primary osteoarthritis of left knee   2. High risk medication use    Orthovisc #3 left knee, B/B Large Joint Inj: L knee on 08/03/2024 3:11 PM Indications: pain Details: 25 G 1.5 in needle, medial approach  Arthrogram: No  Medications: 1.5 mL lidocaine  1 %; 30 mg Hyaluronan 30 MG/2ML Aspirate: 0 mL Outcome: tolerated well, no immediate complications Procedure, treatment alternatives, risks and benefits explained, specific risks discussed. Consent was given by the patient.       Patient tolerated the procedure well.  Procedure note was completed above.  Aftercare was discussed. Waddell Craze, PA-C

## 2024-08-04 ENCOUNTER — Ambulatory Visit: Payer: Self-pay | Admitting: Physician Assistant

## 2024-08-04 LAB — CBC WITH DIFFERENTIAL/PLATELET
Absolute Lymphocytes: 1798 {cells}/uL (ref 850–3900)
Absolute Monocytes: 626 {cells}/uL (ref 200–950)
Basophils Absolute: 59 {cells}/uL (ref 0–200)
Basophils Relative: 1.1 %
Eosinophils Absolute: 173 {cells}/uL (ref 15–500)
Eosinophils Relative: 3.2 %
HCT: 31.8 % — ABNORMAL LOW (ref 38.5–50.0)
Hemoglobin: 11.1 g/dL — ABNORMAL LOW (ref 13.2–17.1)
MCH: 39.2 pg — ABNORMAL HIGH (ref 27.0–33.0)
MCHC: 34.9 g/dL (ref 32.0–36.0)
MCV: 112.4 fL — ABNORMAL HIGH (ref 80.0–100.0)
MPV: 9.2 fL (ref 7.5–12.5)
Monocytes Relative: 11.6 %
Neutro Abs: 2743 {cells}/uL (ref 1500–7800)
Neutrophils Relative %: 50.8 %
Platelets: 357 Thousand/uL (ref 140–400)
RBC: 2.83 Million/uL — ABNORMAL LOW (ref 4.20–5.80)
RDW: 12.6 % (ref 11.0–15.0)
Total Lymphocyte: 33.3 %
WBC: 5.4 Thousand/uL (ref 3.8–10.8)

## 2024-08-04 LAB — COMPREHENSIVE METABOLIC PANEL WITH GFR
AG Ratio: 2.2 (calc) (ref 1.0–2.5)
ALT: 15 U/L (ref 9–46)
AST: 17 U/L (ref 10–35)
Albumin: 4.3 g/dL (ref 3.6–5.1)
Alkaline phosphatase (APISO): 62 U/L (ref 35–144)
BUN: 13 mg/dL (ref 7–25)
CO2: 31 mmol/L (ref 20–32)
Calcium: 8.7 mg/dL (ref 8.6–10.3)
Chloride: 105 mmol/L (ref 98–110)
Creat: 1.22 mg/dL (ref 0.70–1.28)
Globulin: 2 g/dL (ref 1.9–3.7)
Glucose, Bld: 105 mg/dL — ABNORMAL HIGH (ref 65–99)
Potassium: 4.6 mmol/L (ref 3.5–5.3)
Sodium: 142 mmol/L (ref 135–146)
Total Bilirubin: 0.4 mg/dL (ref 0.2–1.2)
Total Protein: 6.3 g/dL (ref 6.1–8.1)
eGFR: 63 mL/min/1.73m2 (ref >=60)

## 2024-08-04 NOTE — Progress Notes (Signed)
 Glucose is 105, creatinine WNL--improved to 1.22 and GFR has improved from 47 to 63-great news!  Patient remains anemic but hemoglobin is improving.  We will continue to monitor lab work closely. Please forward results to PCP as requested.

## 2024-08-10 DIAGNOSIS — M48061 Spinal stenosis, lumbar region without neurogenic claudication: Secondary | ICD-10-CM | POA: Diagnosis not present

## 2024-08-17 ENCOUNTER — Ambulatory Visit: Admitting: Orthopedic Surgery

## 2024-09-08 DIAGNOSIS — D649 Anemia, unspecified: Secondary | ICD-10-CM | POA: Diagnosis not present

## 2024-09-08 DIAGNOSIS — E7849 Other hyperlipidemia: Secondary | ICD-10-CM | POA: Diagnosis not present

## 2024-09-08 DIAGNOSIS — D7589 Other specified diseases of blood and blood-forming organs: Secondary | ICD-10-CM | POA: Diagnosis not present

## 2024-09-08 DIAGNOSIS — E875 Hyperkalemia: Secondary | ICD-10-CM | POA: Diagnosis not present

## 2024-09-08 DIAGNOSIS — I10 Essential (primary) hypertension: Secondary | ICD-10-CM | POA: Diagnosis not present

## 2024-09-08 DIAGNOSIS — R7989 Other specified abnormal findings of blood chemistry: Secondary | ICD-10-CM | POA: Diagnosis not present

## 2024-09-10 DIAGNOSIS — M4316 Spondylolisthesis, lumbar region: Secondary | ICD-10-CM | POA: Diagnosis not present

## 2024-09-10 DIAGNOSIS — M48061 Spinal stenosis, lumbar region without neurogenic claudication: Secondary | ICD-10-CM | POA: Diagnosis not present

## 2024-09-10 DIAGNOSIS — Z6831 Body mass index (BMI) 31.0-31.9, adult: Secondary | ICD-10-CM | POA: Diagnosis not present

## 2024-09-14 DIAGNOSIS — L4050 Arthropathic psoriasis, unspecified: Secondary | ICD-10-CM | POA: Diagnosis not present

## 2024-09-14 DIAGNOSIS — E039 Hypothyroidism, unspecified: Secondary | ICD-10-CM | POA: Diagnosis not present

## 2024-09-14 DIAGNOSIS — E875 Hyperkalemia: Secondary | ICD-10-CM | POA: Diagnosis not present

## 2024-09-14 DIAGNOSIS — L918 Other hypertrophic disorders of the skin: Secondary | ICD-10-CM | POA: Diagnosis not present

## 2024-09-14 DIAGNOSIS — Z6833 Body mass index (BMI) 33.0-33.9, adult: Secondary | ICD-10-CM | POA: Diagnosis not present

## 2024-09-14 DIAGNOSIS — I10 Essential (primary) hypertension: Secondary | ICD-10-CM | POA: Diagnosis not present

## 2024-09-14 DIAGNOSIS — M7022 Olecranon bursitis, left elbow: Secondary | ICD-10-CM | POA: Diagnosis not present

## 2024-09-14 DIAGNOSIS — M1712 Unilateral primary osteoarthritis, left knee: Secondary | ICD-10-CM | POA: Diagnosis not present

## 2024-09-16 DIAGNOSIS — X32XXXD Exposure to sunlight, subsequent encounter: Secondary | ICD-10-CM | POA: Diagnosis not present

## 2024-09-16 DIAGNOSIS — S50852A Superficial foreign body of left forearm, initial encounter: Secondary | ICD-10-CM | POA: Diagnosis not present

## 2024-09-16 DIAGNOSIS — L570 Actinic keratosis: Secondary | ICD-10-CM | POA: Diagnosis not present

## 2024-09-16 NOTE — Progress Notes (Unsigned)
 Office Visit Note  Patient: Alfred Jones             Date of Birth: 10/04/1951           MRN: [id number]             PCP: Lari Elspeth BRAVO, MD Referring: Lari Elspeth BRAVO, MD Visit Date: 09/30/2024 Occupation: Data Unavailable  Subjective:    History of Present Illness: ABRAN GAVIGAN is a 73 y.o. male with history of inflammatory arthritis and osteoarthritis. He takes sulfasalazine  500 mg 2 tablets by mouth twice daily.   Visco left knee September/October 2025  CBC and CMP updated on 08/03/24.     Sulfasalazine  500 mg 2 tablets by mouth twice daily. Discontinue Plaquenil  200 mg 1 tablet by mouth twice daily due to inadequate response. Plaquenil  was initiated in mid-May 2024. Methotrexate- inadequate response. Otezla -inadequate response. PLQ Eye Exam 03/04/2023 WNL Groat f/u 1 year   Activities of Daily Living:  Patient reports morning stiffness for *** {minute/hour:19697}.   Patient {ACTIONS;DENIES/REPORTS:21021675::Denies} nocturnal pain.  Difficulty dressing/grooming: {ACTIONS;DENIES/REPORTS:21021675::Denies} Difficulty climbing stairs: {ACTIONS;DENIES/REPORTS:21021675::Denies} Difficulty getting out of chair: {ACTIONS;DENIES/REPORTS:21021675::Denies} Difficulty using hands for taps, buttons, cutlery, and/or writing: {ACTIONS;DENIES/REPORTS:21021675::Denies}  No Rheumatology ROS completed.   PMFS History:  Patient Active Problem List   Diagnosis Date Noted   Palpitations 01/03/2024   Cardiac chest pain 10/02/2022   Hypoglycemia 10/02/2022   History of dysarthria 10/02/2022   Hematochezia 05/17/2020   Chemotherapy follow-up examination 01/27/2020   Essential thrombocythemia (HCC) 01/27/2020   Erythromelalgia 01/04/2020   Psoriasis 12/13/2019   Family history of coronary artery disease in brother 04/28/2019   Impotence of organic origin 04/28/2019   Pain in joint, multiple sites 04/28/2019   Occlusion and stenosis of carotid artery 04/28/2019   Other  specified disorders of male genital organs 04/28/2019   Psoriatic arthropathy (HCC) 04/28/2019   Vitamin D deficiency 04/28/2019   Pain in joint 03/25/2019   Myalgia and myositis 03/25/2019   Other, multiple, and unspecified sites, insect bite, nonvenomous 03/25/2019   Herniated cervical disc without myelopathy 09/23/2018   Cervical spinal stenosis 06/02/2018   Gait abnormality 05/01/2018   Spinal stenosis of lumbar region 05/01/2018   Chronic right-sided low back pain with right-sided sciatica 04/15/2018   Right groin pain 04/15/2018   Edema 06/04/2017   Arthropathy, multiple sites 03/15/2017   Wheezing 12/12/2016   Other malaise and fatigue 11/04/2016   Paresthesia 09/18/2016   Mononeuritis 09/04/2016   Disturbance of skin sensation 09/03/2016   Pain in soft tissues of limb 09/03/2016   Other atopic dermatitis and related conditions 07/22/2016   Sleep apnea 07/17/2016   Other dyspnea and respiratory abnormality 04/08/2016   Hyperlipidemia 12/06/2015   BPH (benign prostatic hyperplasia) 12/06/2015   Depression 12/06/2015   Obesity, unspecified 12/06/2015   GERD 08/31/2010   Constipation 08/31/2010   DYSPHAGIA 08/31/2010   ABDOMINAL PAIN-MULTIPLE SITES 08/31/2010    Past Medical History:  Diagnosis Date   Acid reflux    Anxiety    Arthritis    RHEUMATOID OR PSORIATIC ?   BCC (basal cell carcinoma of skin) 06/19/2007   left lower chest (CX35FU)   BCC (basal cell carcinoma of skin) infiltrated 03/11/2018   right nose (MOHS)   BCC (basal cell carcinoma of skin) ulcerated 07/11/2010   left cheek    Blood dyscrasia    Essential Thrombocythemia   Cancer (HCC)    Depression    Enlarged prostate    Hyperlipidemia  Hypertension 10/2023   Hypothyroidism    Numbness    right foot   Pneumonia    x 1   Pre-diabetes    no meds   Seasonal allergies    Sleep apnea 2017   uses CPAP nightly   Superficial basal cell carcinoma (BCC) 03/11/2018   left shoulder     Family  History  Problem Relation Age of Onset   Heart disease Mother    Diabetes Mother    Heart attack Mother    Heart disease Father    Diabetes Father    Diabetes Sister    Seizures Sister    Diabetes Brother    Heart attack Brother    Sleep apnea Brother    Heart attack Brother    Sleep apnea Brother    Heart attack Brother    Diabetes Brother    Heart attack Brother    Hypothyroidism Daughter    Hypothyroidism Son    Colon cancer Neg Hx    Past Surgical History:  Procedure Laterality Date   ANTERIOR CERVICAL DECOMP/DISCECTOMY FUSION N/A 09/23/2018   Procedure: Cervical Three-Four Cervical Four-Five Cervical Five-Six Anterior cervical decompression/discectomy/fusion;  Surgeon: Unice Pac, MD;  Location: [facility] OR;  Service: Neurosurgery;  Laterality: N/A;  Cervical Three-Four Cervical Four-Five Cervical Five-Six Anterior cervical decompression/discectomy/fusion   CARDIAC CATHETERIZATION     Greater than 30 YRS AGO AT [facility]   CATARACT EXTRACTION Bilateral 2023   COLONOSCOPY WITH PROPOFOL  N/A 06/23/2020   One small polyp at hepatic flexure that was sessile and ablated without specimen for pathologist. Surveillance 10 years if overall health permits.   CYSTOSCOPY WITH INSERTION OF UROLIFT N/A 08/26/2023   Procedure: CYSTOSCOPY WITH INSERTION OF UROLIFT;  Surgeon: Sherrilee Belvie CROME, MD;  Location: [facility];  Service: Urology;  Laterality: N/A;   ESOPHAGOGASTRODUODENOSCOPY (EGD) WITH PROPOFOL  N/A 08/13/2019   prominent Schatzki ring s/p dilation, overlying erosive reflux esophagitis, small hiatal hernia   EYE SURGERY     METAL REMOVED FROM LEFT EYE  (IN OFFICE)   EYE SURGERY Left 10/2021   retinal tear   LUMBAR LAMINECTOMY/DECOMPRESSION MICRODISCECTOMY Left 12/16/2023   Procedure: Decompressive Lumbar Laminectomy - left - Lumbar three-Lumbar four;  Surgeon: Onetha Kuba, MD;  Location: [facility] OR;  Service: Neurosurgery;  Laterality: Left;   MALONEY DILATION N/A 08/13/2019   Procedure:  AGAPITO DILATION;  Surgeon: Shaaron Lamar HERO, MD;  Location: [facility] SUITE;  Service: Endoscopy;  Laterality: N/A;   MOLE REMOVAL     POLYPECTOMY  06/23/2020   Procedure: POLYPECTOMY;  Surgeon: Shaaron Lamar HERO, MD;  Location: [facility];  Service: Endoscopy;;   POSTERIOR FUSION PEDICLE SCREW PLACEMENT N/A 12/16/2023   Procedure: Posterior Lumbar Interbody Fusion - Lumbar four-Lumbar five Posterior Lateral and Interbody fusion;  Surgeon: Onetha Kuba, MD;  Location: [facility] OR;  Service: Neurosurgery;  Laterality: N/A;   TONSILLECTOMY     as a child   Social History   Tobacco Use   Smoking status: Former    Current packs/day: 0.00    Average packs/day: 1 pack/day for 16.0 years (16.0 ttl pk-yrs)    Types: Cigarettes    Start date: 10/22/1970    Quit date: 10/22/1986    Years since quitting: 37.9    Passive exposure: Past (minimal)   Smokeless tobacco: Never  Vaping Use   Vaping status: Never Used  Substance Use Topics   Alcohol  use: No   Drug use: No   Social History   Social History Narrative  Lives at home with his wife and son.   Right-handed.   2-4 cups caffeine per day.     Immunization History  Administered Date(s) Administered   Influenza Split 07/26/2015   Tdap 10/05/2002     Objective: Vital Signs: There were no vitals taken for this visit.   Physical Exam Vitals and nursing note reviewed.  Constitutional:      Appearance: He is well-developed.  HENT:     Head: Normocephalic and atraumatic.  Eyes:     Conjunctiva/sclera: Conjunctivae normal.     Pupils: Pupils are equal, round, and reactive to light.  Cardiovascular:     Rate and Rhythm: Normal rate and regular rhythm.     Heart sounds: Normal heart sounds.  Pulmonary:     Effort: Pulmonary effort is normal.     Breath sounds: Normal breath sounds.  Abdominal:     General: Bowel sounds are normal.     Palpations: Abdomen is soft.  Musculoskeletal:     Cervical back: Normal range of motion and neck  supple.  Skin:    General: Skin is warm and dry.     Capillary Refill: Capillary refill takes less than 2 seconds.  Neurological:     Mental Status: He is alert and oriented to person, place, and time.  Psychiatric:        Behavior: Behavior normal.      Musculoskeletal Exam: ***  CDAI Exam: CDAI Score: -- Patient Global: --; Provider Global: -- Swollen: --; Tender: -- Joint Exam 09/30/2024   No joint exam has been documented for this visit   There is currently no information documented on the homunculus. Go to the Rheumatology activity and complete the homunculus joint exam.  Investigation: No additional findings.  Imaging: No results found.  Recent Labs: Lab Results  Component Value Date   WBC 5.4 08/03/2024   HGB 11.1 (L) 08/03/2024   PLT 357 08/03/2024   NA 142 08/03/2024   K 4.6 08/03/2024   CL 105 08/03/2024   CO2 31 08/03/2024   GLUCOSE 105 (H) 08/03/2024   BUN 13 08/03/2024   CREATININE 1.22 08/03/2024   BILITOT 0.4 08/03/2024   ALKPHOS 56 04/15/2018   AST 17 08/03/2024   ALT 15 08/03/2024   PROT 6.3 08/03/2024   ALBUMIN  4.5 04/15/2018   CALCIUM  8.7 08/03/2024   GFRAA 78 12/08/2020    Speciality Comments: PLQ Eye Exam 03/04/2023 WNL Groat f/u 1 year  Methotrexate 2 years 09/18 -no response  Otezla  1 year no response treated at [facility] rheumatology for possible psoriatic arthritis  Procedures:  No procedures performed Allergies: Patient has no known allergies.   Assessment / Plan:     Visit Diagnoses: Inflammatory arthritis  High risk medication use  Olecranon bursitis of left elbow  Primary osteoarthritis of both hands  Primary osteoarthritis of both knees  Primary osteoarthritis of both feet  DDD (degenerative disc disease), cervical  S/P lumbar fusion  Neuropathy  Family history of psoriasis  Essential thrombocythemia (HCC)  Erythromelalgia  History of gastroesophageal reflux (GERD)  History of  hyperlipidemia  Vitamin D deficiency  Recurrent squamous cell carcinoma in situ of skin of cheek  Other insomnia  Orders: No orders of the defined types were placed in this encounter.  No orders of the defined types were placed in this encounter.   Face-to-face time spent with patient was *** minutes. Greater than 50% of time was spent in counseling and coordination of care.  Follow-Up Instructions: No  follow-ups on file.   Waddell CHRISTELLA Craze, PA-C  Note - This record has been created using Dragon software.  Chart creation errors have been sought, but may not always  have been located. Such creation errors do not reflect on  the standard of medical care.

## 2024-09-21 ENCOUNTER — Other Ambulatory Visit: Payer: Self-pay | Admitting: Physician Assistant

## 2024-09-21 NOTE — Telephone Encounter (Signed)
 Last Fill: 01/15/2024  Labs: 08/03/2024 Glucose is 105, creatinine WNL--improved to 1.22 and GFR has improved from 47 to 63-great news!   Next Visit: 09/30/2024  Last Visit: 04/30/2024  DX: Inflammatory arthritis   Current Dose per office note 04/30/2024: Sulfasalazine  500 mg 2 tablets by mouth twice daily.   Okay to refill Sulfasalazine ?

## 2024-09-30 ENCOUNTER — Encounter: Payer: Self-pay | Admitting: Physician Assistant

## 2024-09-30 ENCOUNTER — Ambulatory Visit: Attending: Physician Assistant | Admitting: Physician Assistant

## 2024-09-30 VITALS — BP 135/74 | HR 76 | Temp 98.5°F | Resp 14 | Ht 71.0 in | Wt 227.6 lb

## 2024-09-30 DIAGNOSIS — Z79899 Other long term (current) drug therapy: Secondary | ICD-10-CM | POA: Diagnosis present

## 2024-09-30 DIAGNOSIS — I7381 Erythromelalgia: Secondary | ICD-10-CM | POA: Insufficient documentation

## 2024-09-30 DIAGNOSIS — M170 Bilateral primary osteoarthritis of knee: Secondary | ICD-10-CM | POA: Insufficient documentation

## 2024-09-30 DIAGNOSIS — M19042 Primary osteoarthritis, left hand: Secondary | ICD-10-CM | POA: Insufficient documentation

## 2024-09-30 DIAGNOSIS — M1380 Other specified arthritis, unspecified site: Secondary | ICD-10-CM | POA: Insufficient documentation

## 2024-09-30 DIAGNOSIS — G4709 Other insomnia: Secondary | ICD-10-CM | POA: Insufficient documentation

## 2024-09-30 DIAGNOSIS — M5030 Other cervical disc degeneration, unspecified cervical region: Secondary | ICD-10-CM | POA: Insufficient documentation

## 2024-09-30 DIAGNOSIS — G8929 Other chronic pain: Secondary | ICD-10-CM | POA: Insufficient documentation

## 2024-09-30 DIAGNOSIS — Z8719 Personal history of other diseases of the digestive system: Secondary | ICD-10-CM | POA: Diagnosis present

## 2024-09-30 DIAGNOSIS — D0439 Carcinoma in situ of skin of other parts of face: Secondary | ICD-10-CM

## 2024-09-30 DIAGNOSIS — D473 Essential (hemorrhagic) thrombocythemia: Secondary | ICD-10-CM | POA: Diagnosis not present

## 2024-09-30 DIAGNOSIS — Z981 Arthrodesis status: Secondary | ICD-10-CM | POA: Diagnosis not present

## 2024-09-30 DIAGNOSIS — Z8639 Personal history of other endocrine, nutritional and metabolic disease: Secondary | ICD-10-CM | POA: Insufficient documentation

## 2024-09-30 DIAGNOSIS — M19072 Primary osteoarthritis, left ankle and foot: Secondary | ICD-10-CM | POA: Diagnosis present

## 2024-09-30 DIAGNOSIS — E559 Vitamin D deficiency, unspecified: Secondary | ICD-10-CM | POA: Diagnosis present

## 2024-09-30 DIAGNOSIS — M25562 Pain in left knee: Secondary | ICD-10-CM | POA: Insufficient documentation

## 2024-09-30 DIAGNOSIS — M19071 Primary osteoarthritis, right ankle and foot: Secondary | ICD-10-CM | POA: Diagnosis present

## 2024-09-30 DIAGNOSIS — M7022 Olecranon bursitis, left elbow: Secondary | ICD-10-CM | POA: Diagnosis present

## 2024-09-30 DIAGNOSIS — M19041 Primary osteoarthritis, right hand: Secondary | ICD-10-CM | POA: Diagnosis present

## 2024-09-30 DIAGNOSIS — Z840 Family history of diseases of the skin and subcutaneous tissue: Secondary | ICD-10-CM | POA: Diagnosis present

## 2024-09-30 DIAGNOSIS — G629 Polyneuropathy, unspecified: Secondary | ICD-10-CM

## 2024-09-30 LAB — SYNOVIAL FLUID ANALYSIS, COMPLETE
Basophils, %: 0 %
Eosinophils-Synovial: 0 % (ref 0–2)
Lymphocytes-Synovial Fld: 32 % (ref 0–74)
Monocyte/Macrophage: 64 % (ref 0–69)
Neutrophil, Synovial: 4 % (ref 0–24)
Synoviocytes, %: 0 % (ref 0–15)
WBC, Synovial: 321 {cells}/uL — ABNORMAL HIGH (ref <150)

## 2024-09-30 MED ORDER — TRIAMCINOLONE ACETONIDE 40 MG/ML IJ SUSP
40.0000 mg | INTRAMUSCULAR | Status: AC | PRN
  Administered 2024-09-30: 40 mg via INTRA_ARTICULAR

## 2024-09-30 MED ORDER — LIDOCAINE HCL 1 % IJ SOLN
1.0000 mL | INTRAMUSCULAR | Status: AC | PRN
  Administered 2024-09-30: 1 mL

## 2024-09-30 MED ORDER — LIDOCAINE HCL 1 % IJ SOLN
1.5000 mL | INTRAMUSCULAR | Status: AC | PRN
  Administered 2024-09-30: 1.5 mL

## 2024-09-30 NOTE — Patient Instructions (Signed)
 Standing Labs We placed an order today for your standing lab work.   Please have your standing labs drawn in mid-January and every 3 months   Please have your labs drawn 2 weeks prior to your appointment so that the provider can discuss your lab results at your appointment, if possible.  Please note that you may see your imaging and lab results in MyChart before we have reviewed them. We will contact you once all results are reviewed. Please allow our office up to 72 hours to thoroughly review all of the results before contacting the office for clarification of your results.  WALK-IN LAB HOURS  Monday through Thursday from 8:00 am - 4:30 pm and Friday from 8:00 am-12:00 pm.  Patients with office visits requiring labs will be seen before walk-in labs.  You may encounter longer than normal wait times. Please allow additional time. Wait times may be shorter on  Monday and Thursday afternoons.  We do not book appointments for walk-in labs. We appreciate your patience and understanding with our staff.   Labs are drawn by [facility]. Please bring your co-pay at the time of your lab draw.  You may receive a bill from [facility] for your lab work.  Please note if you are on Hydroxychloroquine  and and an order has been placed for a Hydroxychloroquine  level,  you will need to have it drawn 4 hours or more after your last dose.  If you wish to have your labs drawn at another location, please call the office 24 hours in advance so we can fax the orders.  The office is located at [address], [address], [place], [place] [address]   If you have any questions regarding directions or hours of operation,  please call [phone].   As a reminder, please drink plenty of water  prior to coming for your lab work. Thanks!

## 2024-10-01 ENCOUNTER — Ambulatory Visit: Payer: Self-pay | Admitting: Rheumatology

## 2024-10-01 NOTE — Progress Notes (Signed)
 Synovial fluid shows mild inflammation.  Crystals negative.

## 2024-12-28 ENCOUNTER — Ambulatory Visit: Admitting: Urology

## 2025-03-02 ENCOUNTER — Ambulatory Visit: Admitting: Rheumatology
